# Patient Record
Sex: Female | Born: 1980
Health system: Southern US, Community
[De-identification: ages and names within clinical notes are randomized; demographics above are authoritative.]

## PROBLEM LIST (undated history)

## (undated) ENCOUNTER — Inpatient Hospital Stay (HOSPITAL_COMMUNITY): Payer: Self-pay

## (undated) DIAGNOSIS — F32A Depression, unspecified: Secondary | ICD-10-CM

## (undated) DIAGNOSIS — C439 Malignant melanoma of skin, unspecified: Secondary | ICD-10-CM

## (undated) DIAGNOSIS — R51 Headache: Secondary | ICD-10-CM

## (undated) DIAGNOSIS — R519 Headache, unspecified: Secondary | ICD-10-CM

## (undated) DIAGNOSIS — F419 Anxiety disorder, unspecified: Secondary | ICD-10-CM

## (undated) DIAGNOSIS — R011 Cardiac murmur, unspecified: Secondary | ICD-10-CM

## (undated) DIAGNOSIS — G35 Multiple sclerosis: Principal | ICD-10-CM

## (undated) HISTORY — DX: Cardiac murmur, unspecified: R01.1

## (undated) HISTORY — PX: MELANOMA EXCISION: SHX5266

## (undated) HISTORY — DX: Headache: R51

## (undated) HISTORY — DX: Headache, unspecified: R51.9

## (undated) HISTORY — DX: Malignant melanoma of skin, unspecified: C43.9

---
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
## 2012-09-09 ENCOUNTER — Inpatient Hospital Stay (HOSPITAL_COMMUNITY)
Admission: EM | Admit: 2012-09-09 | Discharge: 2012-09-11 | DRG: 060 | Disposition: A | Discharge status: Home / Self Care | Payer: Medicaid Other | Attending: Family Medicine | Admitting: Family Medicine

## 2012-09-09 ENCOUNTER — Inpatient Hospital Stay (HOSPITAL_COMMUNITY): Payer: Medicaid Other

## 2012-09-09 ENCOUNTER — Encounter (HOSPITAL_COMMUNITY): Payer: Self-pay | Admitting: *Deleted

## 2012-09-09 DIAGNOSIS — D509 Iron deficiency anemia, unspecified: Secondary | ICD-10-CM | POA: Diagnosis present

## 2012-09-09 DIAGNOSIS — R32 Unspecified urinary incontinence: Secondary | ICD-10-CM | POA: Diagnosis present

## 2012-09-09 DIAGNOSIS — G35 Multiple sclerosis: Principal | ICD-10-CM | POA: Diagnosis present

## 2012-09-09 HISTORY — DX: Multiple sclerosis: G35

## 2012-09-09 LAB — COMPREHENSIVE METABOLIC PANEL
AST: 13 U/L (ref 0–37)
Albumin: 3.8 g/dL (ref 3.5–5.2)
Calcium: 9.2 mg/dL (ref 8.4–10.5)
Creatinine, Ser: 0.64 mg/dL (ref 0.50–1.10)

## 2012-09-09 LAB — URINALYSIS, ROUTINE W REFLEX MICROSCOPIC
Glucose, UA: NEGATIVE mg/dL
Specific Gravity, Urine: 1.034 — ABNORMAL HIGH (ref 1.005–1.030)

## 2012-09-09 LAB — URINE MICROSCOPIC-ADD ON

## 2012-09-09 LAB — CBC WITH DIFFERENTIAL/PLATELET
Basophils Absolute: 0 10*3/uL (ref 0.0–0.1)
Basophils Relative: 0 % (ref 0–1)
Eosinophils Relative: 2 % (ref 0–5)
HCT: 34.1 % — ABNORMAL LOW (ref 36.0–46.0)
MCHC: 33.7 g/dL (ref 30.0–36.0)
MCV: 91.4 fL (ref 78.0–100.0)
Monocytes Absolute: 0.5 10*3/uL (ref 0.1–1.0)
Neutro Abs: 4 10*3/uL (ref 1.7–7.7)
RDW: 12.6 % (ref 11.5–15.5)

## 2012-09-09 LAB — PREGNANCY, URINE: Preg Test, Ur: NEGATIVE

## 2012-09-09 MED ORDER — ONDANSETRON HCL 4 MG PO TABS: 4.0000 mg | ORAL_TABLET | Freq: Four times a day (QID) | ORAL | Status: DC | PRN

## 2012-09-09 MED ORDER — ZOLPIDEM TARTRATE 5 MG PO TABS: 5.0000 mg | ORAL_TABLET | Freq: Every evening | ORAL | Status: DC | PRN

## 2012-09-09 MED ORDER — ONDANSETRON HCL 4 MG/2ML IJ SOLN: 4.0000 mg | Freq: Three times a day (TID) | INTRAMUSCULAR | Status: DC | PRN

## 2012-09-09 MED ORDER — ACETAMINOPHEN 325 MG PO TABS
650.0000 mg | ORAL_TABLET | Freq: Four times a day (QID) | ORAL | Status: DC | PRN
  Administered 2012-09-09: 650 mg via ORAL
  Filled 2012-09-09 (×2): qty 2

## 2012-09-09 MED ORDER — ONDANSETRON HCL 4 MG/2ML IJ SOLN: 4.0000 mg | Freq: Four times a day (QID) | INTRAMUSCULAR | Status: DC | PRN

## 2012-09-09 MED ORDER — ALUM & MAG HYDROXIDE-SIMETH 200-200-20 MG/5ML PO SUSP: 30.0000 mL | Freq: Four times a day (QID) | ORAL | Status: DC | PRN

## 2012-09-09 MED ORDER — SODIUM CHLORIDE 0.9 % IV SOLN
INTRAVENOUS | Status: AC
  Administered 2012-09-09: 20:00:00 via INTRAVENOUS

## 2012-09-09 MED ORDER — SENNOSIDES-DOCUSATE SODIUM 8.6-50 MG PO TABS
1.0000 | ORAL_TABLET | Freq: Every evening | ORAL | Status: DC | PRN
  Administered 2012-09-10: 1 via ORAL
  Filled 2012-09-09: qty 1

## 2012-09-09 MED ORDER — POLYSACCHARIDE IRON COMPLEX 150 MG PO CAPS
150.0000 mg | ORAL_CAPSULE | Freq: Every day | ORAL | Status: DC
  Administered 2012-09-09 – 2012-09-11 (×3): 150 mg via ORAL
  Filled 2012-09-09 (×3): qty 1

## 2012-09-09 MED ORDER — ENOXAPARIN SODIUM 40 MG/0.4ML ~~LOC~~ SOLN
40.0000 mg | SUBCUTANEOUS | Status: DC
  Administered 2012-09-09 – 2012-09-10 (×2): 40 mg via SUBCUTANEOUS
  Filled 2012-09-09 (×3): qty 0.4

## 2012-09-09 MED ORDER — SODIUM CHLORIDE 0.9 % IV SOLN
1000.0000 mg | Freq: Every day | INTRAVENOUS | Status: DC
  Administered 2012-09-09 – 2012-09-11 (×3): 1000 mg via INTRAVENOUS
  Filled 2012-09-09 (×3): qty 8

## 2012-09-09 MED ORDER — GADOBENATE DIMEGLUMINE 529 MG/ML IV SOLN
15.0000 mL | Freq: Once | INTRAVENOUS | Status: AC | PRN
  Administered 2012-09-09: 15 mL via INTRAVENOUS

## 2012-09-09 MED ORDER — ACETAMINOPHEN 650 MG RE SUPP: 650.0000 mg | Freq: Four times a day (QID) | RECTAL | Status: DC | PRN

## 2012-09-09 NOTE — ED Notes (Signed)
Ordered tray to be delivered on 4N-11

## 2012-09-09 NOTE — Progress Notes (Signed)
[facility] History and Physical  Terence Googe ZOX:[id number] DOB: 1980-11-01 DOA: 09/09/2012  Referring physician: ED PCP: This patient does not have a primary care physician Specialists: Neurology  Chief Complaint:  Chief Complaint  Patient presents with  . Weakness     HPI: Sarah [name] is a 32 y.o. female with history of multiple sclerosis, who was followed at [facility] until 2011, presented to the emergency room today with progressive urinary incontinence, alternating with urinary retention, bilateral lower extremity numbness and weakness. She denies double vision, difficulty swallowing, difficulty breathing. Her prior episodes were manifested by up the neuritis in 2002 and right leg weakness in 2011. She used to be on Betaseron but currently is on no medications for multiple sclerosis. The patient was seen by the neurologist in the emergency room, started on pulse dose IV steroids and referred for admission.   Review of Systems:  The patient reports occasional headaches, significant difficulty walking The patient denies anorexia, fever, weight loss,, vision loss, decreased hearing, hoarseness, chest pain, syncope, dyspnea on exertion, peripheral edema, balance deficits, hemoptysis, abdominal pain, melena, hematochezia, severe indigestion/heartburn, hematuria, genital sores, muscle weakness, suspicious skin lesions, transient blindness, depression, unusual weight change, abnormal bleeding, enlarged lymph nodes, angioedema, and breast masses.    Past Medical History  Diagnosis Date  . Multiple sclerosis    Past Surgical History  Procedure Date  . Cesarean section   . Melanoma excision age 66    on head   Social History:  reports that she has never smoked. She does not have any smokeless tobacco history on file. She reports that she drinks alcohol. She reports that she does not use illicit drugs. Patient is independent at home No Known Allergies  Family History  Problem  Relation Age of Onset  . Diabetes Mother      Prior to Admission medications   Medication Sig Start Date End Date Taking? Authorizing Provider  magnesium gluconate (MAGONATE) 500 MG tablet Take 250 mg by mouth daily.   Yes Historical Provider, MD   Physical Exam: Filed Vitals:   09/09/12 1615 09/09/12 1630 09/09/12 1645 09/09/12 1700  BP: 119/80 110/74 113/68 115/58  Pulse: 73 75 64 60  Temp:      TempSrc:      Resp: 14 17    SpO2: 99% 100% 100% 99%     General:  Alert and oriented x3  Eyes: Pupil equal round react to light accommodation, extra ocular movement intact  ENT: Clear pharynx, moist oral mucosa, normal-appearing external ears  Neck: No jugular venous distention  Cardiovascular: Regular rate and rhythm without murmurs rubs or gallops  Respiratory: Clear to auscultation bilaterally without wheezes rhonchi crackles  Abdomen: Soft nontender nondistended bowel sounds are present  Skin: Warm dry without rashes  Musculoskeletal: Intact muscle bulk and tone  Psychiatric: Euthymic  Neurologic: Cranial nerves 2-12 intact, strength 3/5 both lower extremities bilaterally  Labs on Admission:  Basic Metabolic Panel:  Lab 09/09/12 8119  NA 139  K 3.5  CL 102  CO2 27  GLUCOSE 95  BUN 10  CREATININE 0.64  CALCIUM 9.2  MG --  PHOS --   Liver Function Tests:  Lab 09/09/12 1530  AST 13  ALT 11  ALKPHOS 77  BILITOT 0.2*  PROT 7.7  ALBUMIN 3.8   No results found for this basename: LIPASE:5,AMYLASE:5 in the last 168 hours No results found for this basename: AMMONIA:5 in the last 168 hours CBC:  Lab 09/09/12 1530  WBC 6.8  NEUTROABS 4.0  HGB 11.5*  HCT 34.1*  MCV 91.4  PLT 264   Cardiac Enzymes: No results found for this basename: CKTOTAL:5,CKMB:5,CKMBINDEX:5,TROPONINI:5 in the last 168 hours  BNP (last 3 results) No results found for this basename: PROBNP:3 in the last 8760 hours CBG: No results found for this basename: GLUCAP:5 in the last 168  hours  Radiological Exams on Admission: No results found.   Assessment/Plan Principal Problem:  *Multiple sclerosis exacerbation Active Problems:  Anemia, iron deficiency  Urinary incontinence   1. Multiple sclerosis exacerbation-suspect affecting the cervical spinal cord. Patient will undergo an MRI of the head and C-spine with IV contrast. We'll admit her, obtain frequent neurological checks, start pulse dose steroids. Will get also physical therapy evaluation 2. Anemia-probably iron deficiency from heavy menses. Start oral iron  Sarah [name] [facility] Pager [phone]  If 7PM-7AM, please contact night-coverage [email or web address] Password [facility] 09/09/2012, 5:32 PM

## 2012-09-09 NOTE — ED Notes (Signed)
Pt to MRI

## 2012-09-09 NOTE — Consult Note (Signed)
NEURO HOSPITALIST CONSULT NOTE    Reason for Consult:MS relapse.  HPI:                                                                                                                                          Sarah [name] is an 32 y.o. female, right handed, with a past medical history significant for multiple sclerosis formally diagnosed in 2002 after an episode of optic neuritis, who was in her usual stated of heath until 3 weeks ago when started having progressive bilateral lower extremity weakness, numbness around the genitalia area, bladder and bowel incontinence. She said that the weakness is greater in the right than the left leg and is so severe that she had had a couple of falls " because I can not stand and walk long distances". Of importance, she said that her last relapse occurred in 2011 and was characterized by right leg weakness. She indicated that she was initially treated with Betaseron but had to stop it because of side effects. She hasn't seen her neurologist at [facility] since probably 2010-2011, and at that time she had her last MRI. Denies vertigo, double vision, slurred speech, difficulty swallowing, or tremors. Frequent headache.   Past Medical History  Diagnosis Date  . Multiple sclerosis     Past Surgical History  Procedure Date  . Cesarean section     No family history on file.  Family History: no MS.  Social History:  reports that she has never smoked. She does not have any smokeless tobacco history on file. She reports that she drinks alcohol. She reports that she does not use illicit drugs.  No Known Allergies  MEDICATIONS:                                                                                                                     I have reviewed the patient's current medications.   ROS:  History obtained from the patient  General ROS: negative for - chills, fatigue, fever, night sweats, weight gain or weight loss Psychological ROS: negative for - behavioral disorder, hallucinations, memory difficulties, mood swings or suicidal ideation Ophthalmic ROS: negative for - blurry vision, double vision, eye pain or loss of vision ENT ROS: negative for - epistaxis, nasal discharge, oral lesions, sore throat, tinnitus or vertigo Allergy and Immunology ROS: negative for - hives or itchy/watery eyes Hematological and Lymphatic ROS: negative for - bleeding problems, bruising or swollen lymph nodes Endocrine ROS: negative for - galactorrhea, hair pattern changes, polydipsia/polyuria or temperature intolerance Respiratory ROS: negative for - cough, hemoptysis, shortness of breath or wheezing Cardiovascular ROS: negative for - chest pain, dyspnea on exertion, edema or irregular heartbeat Gastrointestinal ROS: negative for - abdominal pain, diarrhea, hematemesis, nausea/vomiting or stool incontinence Genito-Urinary ROS: negative for - dysuria, hematuria but positive for frequency/urgency Musculoskeletal ROS: negative for - joint swelling. Neurological ROS: as noted in HPI Dermatological ROS: negative for rash and skin lesion changes  Physical exam: pleasant female in no apparent distress. Blood pressure 147/83, pulse 102, temperature 98.1 F (36.7 C), temperature source Oral, resp. rate 18, SpO2 100.00%. Head: normocephalic. Neck: supple. Cardiac: no murmurs. Lungs: clear. Abdomen: soft. Extremities: no edema.  Neurologic Examination:                                                                                                      Mental Status: Alert, oriented, thought content appropriate.  Speech fluent without evidence of aphasia.  Able to follow 3 step commands without difficulty. Cranial Nerves: II: Discs flat bilaterally; Visual fields grossly normal, pupils equal, round,  reactive to light and accommodation III,IV, VI: ptosis not present, extra-ocular motions intact bilaterally V,VII: smile symmetric, facial light touch sensation normal bilaterally VIII: hearing normal bilaterally IX,X: gag reflex present XI: bilateral shoulder shrug XII: midline tongue extension Motor:  Significant for proximal and distal bilateral lower extremity weakness, right greater than left. Tone and bulk:normal tone throughout; no atrophy noted Sensory: decreased pinprick and light touch from approximately T7-8 all the way downward to her lower legs. Deep Tendon Reflexes: 2+ upper extremities. Hyperreflexia without clonus bilateral lower extremities.  Plantars: upgoing. Cerebellar: normal finger-to-nose,  normal heel-to-shin test Gait: no ataxia but difficulty walking due to severe bilateral LE weakness. CV: pulses palpable throughout     No results found for this basename: cbc, bmp, coags, chol, tri, ldl, hga1c    Results for orders placed during the hospital encounter of 09/09/12 (from the past 48 hour(s))  CBC WITH DIFFERENTIAL     Status: Abnormal   Collection Time   09/09/12  3:30 PM      Component Value Range Comment   WBC 6.8  4.0 - 10.5 K/uL    RBC 3.73 (*) 3.87 - 5.11 MIL/uL    Hemoglobin 11.5 (*) 12.0 - 15.0 g/dL    HCT 54.0 (*) 98.1 - 46.0 %    MCV 91.4  78.0 - 100.0 fL    MCH 30.8  26.0 - 34.0 pg  MCHC 33.7  30.0 - 36.0 g/dL    RDW 56.2  13.0 - 86.5 %    Platelets 264  150 - 400 K/uL    Neutrophils Relative 59  43 - 77 %    Neutro Abs 4.0  1.7 - 7.7 K/uL    Lymphocytes Relative 32  12 - 46 %    Lymphs Abs 2.2  0.7 - 4.0 K/uL    Monocytes Relative 8  3 - 12 %    Monocytes Absolute 0.5  0.1 - 1.0 K/uL    Eosinophils Relative 2  0 - 5 %    Eosinophils Absolute 0.1  0.0 - 0.7 K/uL    Basophils Relative 0  0 - 1 %    Basophils Absolute 0.0  0.0 - 0.1 K/uL     No results found.   Assessment/Plan: 32 years old female with long standing history of  RR-MS, no taking disease modifying therapy since 2010-11, presents with a spinal cord syndrome in the context of a MS relapse. Recommendations: IV solumedrol 1 gram daily for 3 days. MRI brain and cervico-thoracic spine with and without contrast. Physical therapy consult. Will follow up.  Wyatt Portela, MD [facility] [phone]  09/09/2012, 4:07 PM

## 2012-09-09 NOTE — ED Notes (Signed)
Patient has hx of MS,  She is having decreased sensation from her waist down.  She states her legs feel weak/heavy.  Patient states her legs are buckling on her.  She is also having decreased ability to control her bowel/bladder.  Patient states her sx have worsened over the past 3 weeks.  Patient is not taking any meds at present.  Patient was seen at [facility] and hospitalized 02-11.  Patient states she noted onset of weakness 6 mths ago but thought it was just related to stress.  Patient has had a couple of falls.  Her left knee was swollen.  Her right knee is the one that is weaker and gives out

## 2012-09-09 NOTE — ED Provider Notes (Signed)
History     CSN: [id number]  Arrival date & time 09/09/12  1411   First MD Initiated Contact with Patient 09/09/12 1430      Chief Complaint  Patient presents with  . Weakness    (Consider location/radiation/quality/duration/timing/severity/associated sxs/prior treatment) HPI Comments: Patient reports history of multiple sclerosis and believes she is having exacerbation. States over the past 3 weeks she's had decreased sensation from her waist down, weakness in her legs, decreased ability to control her bowel and bladder with multiple episodes of incontinence. She's gotten too weak to stand. She's has not been on medications for her MS. She has not seen her neurologist at [facility] since 2010. She denies any fevers or vomiting. She denies any chest pain or shortness of breath. No difficulty breathing or swallowing.  The history is provided by the patient.    Past Medical History  Diagnosis Date  . Multiple sclerosis     Past Surgical History  Procedure Date  . Cesarean section     No family history on file.  History  Substance Use Topics  . Smoking status: Never Smoker   . Smokeless tobacco: Not on file  . Alcohol Use: Yes    OB History    Grav Para Term Preterm Abortions TAB SAB Ect Mult Living                  Review of Systems  Constitutional: Negative for fever, activity change and appetite change.  HENT: Negative for congestion and rhinorrhea.   Respiratory: Negative for cough, chest tightness and shortness of breath.   Cardiovascular: Negative for chest pain.  Gastrointestinal: Negative for nausea, vomiting and abdominal pain.  Genitourinary: Negative for dysuria.  Musculoskeletal: Positive for myalgias and arthralgias. Negative for back pain.  Skin: Negative for rash.  Neurological: Positive for weakness and numbness. Negative for light-headedness and headaches.  A complete 10 system review of systems was obtained and all systems are negative except as noted in  the HPI and PMH.    Allergies  Review of patient's allergies indicates no known allergies.  Home Medications   Current Outpatient Rx  Name  Route  Sig  Dispense  Refill  . MAGNESIUM GLUCONATE 500 MG PO TABS   Oral   Take 250 mg by mouth daily.           BP 147/83  Pulse 102  Temp 98.1 F (36.7 C) (Oral)  Resp 18  SpO2 100%  Physical Exam  Constitutional: She is oriented to person, place, and time. She appears well-developed and well-nourished. No distress.  HENT:  Head: Normocephalic and atraumatic.  Mouth/Throat: Oropharynx is clear and moist. No oropharyngeal exudate.  Eyes: Conjunctivae normal and EOM are normal. Pupils are equal, round, and reactive to light.  Neck: Normal range of motion. Neck supple.  Cardiovascular: Normal rate, regular rhythm and normal heart sounds.   No murmur heard. Pulmonary/Chest: Effort normal and breath sounds normal. No respiratory distress.  Abdominal: Soft. There is no tenderness. There is no rebound and no guarding.  Genitourinary:       No saddle anesthesia, normal rectal tone. Chaperone present.  Musculoskeletal: Normal range of motion. She exhibits no edema and no tenderness.  Neurological: She is alert and oriented to person, place, and time. No cranial nerve deficit. Coordination normal.       4/5 strength in the bilateral lower extremities. Weak great toe extension bilaterally, weak ankle flexion and extension. +2 DP and PT pulses. Sensation is  subjectively decreased  Skin: Skin is warm.    ED Course  Procedures (including critical care time)  Labs Reviewed  CBC WITH DIFFERENTIAL - Abnormal; Notable for the following:    RBC 3.73 (*)     Hemoglobin 11.5 (*)     HCT 34.1 (*)     All other components within normal limits  COMPREHENSIVE METABOLIC PANEL - Abnormal; Notable for the following:    Total Bilirubin 0.2 (*)     All other components within normal limits  URINALYSIS, ROUTINE W REFLEX MICROSCOPIC - Abnormal; Notable  for the following:    APPearance CLOUDY (*)     Specific Gravity, Urine 1.034 (*)     Hgb urine dipstick SMALL (*)     Leukocytes, UA SMALL (*)     All other components within normal limits  URINE MICROSCOPIC-ADD ON - Abnormal; Notable for the following:    Squamous Epithelial / LPF FEW (*)     All other components within normal limits  PREGNANCY, URINE   No results found.   1. Multiple sclerosis exacerbation       MDM  History of untreated multiple sclerosis presenting with decreased sensation from waist down with weakness in legs and bladder and bowel incontinence.  Lower showing any weakness with incontinence concerning for MS of spinal cord.  Discussed with Dr. Leroy Kennedy of neurology who has seen the patient. Recommends MR of brain and spine. High dose steroids begun. Will admit to hospitalist service.    Glynn Octave, MD 09/09/12 1754

## 2012-09-10 LAB — CBC
HCT: 37.1 % (ref 36.0–46.0)
MCHC: 33.4 g/dL (ref 30.0–36.0)
RDW: 12.4 % (ref 11.5–15.5)

## 2012-09-10 LAB — BASIC METABOLIC PANEL
BUN: 9 mg/dL (ref 6–23)
Calcium: 9.6 mg/dL (ref 8.4–10.5)
Creatinine, Ser: 0.6 mg/dL (ref 0.50–1.10)
GFR calc Af Amer: >90 mL/min (ref >90)
GFR calc non Af Amer: >90 mL/min (ref >90)

## 2012-09-10 MED ORDER — BACLOFEN 5 MG HALF TABLET
5.0000 mg | ORAL_TABLET | Freq: Two times a day (BID) | ORAL | Status: DC
  Administered 2012-09-10 – 2012-09-11 (×3): 5 mg via ORAL
  Filled 2012-09-10 (×4): qty 1

## 2012-09-10 NOTE — Progress Notes (Addendum)
NEURO HOSPITALIST PROGRESS NOTE   SUBJECTIVE:                                                                                                                        No new neurological complains today but said that the " old cramps in my legs are coming back". Receiving IV solumedrol, day 2/3. Started PT. MRI brain and cervico-thoracic spine reviewed:diffuse cord abnormality extending involving cervical and thoracic cord with associated areas of enhancement at T5- 6. Likewise, brain MRI demonstrated periventricular demyelinating lesions without areas of enhancement.    OBJECTIVE:                                                                                                                           Vital signs in last 24 hours: Temp:  [97.7 F (36.5 C)-98.1 F (36.7 C)] 98.1 F (36.7 C) (02/02 1021) Pulse Rate:  [60-102] 65  (02/02 1021) Resp:  [14-19] 18  (02/02 1021) BP: (102-147)/(58-83) 112/58 mmHg (02/02 1021) SpO2:  [99 %-100 %] 100 % (02/02 1021) Weight:  [135.036 kg (297 lb 11.2 oz)] 135.036 kg (297 lb 11.2 oz) (02/01 2053)  Intake/Output from previous day: 02/01 0701 - 02/02 0700 In: 200 [P.O.:200] Out: -  Intake/Output this shift:   Nutritional status: General  Past Medical History  Diagnosis Date  . Multiple sclerosis     Neurologic ROS negative with exception of above. Musculoskeletal ROS: bilateral legs weakness and paraesthesias.  Neurologic Exam:  Mental Status:  Alert, oriented, thought content appropriate. Speech fluent without evidence of aphasia. Able to follow 3 step commands without difficulty.  Cranial Nerves:  II: Discs flat bilaterally; Visual fields grossly normal, pupils equal, round, reactive to light and accommodation  III,IV, VI: ptosis not present, extra-ocular motions intact bilaterally  V,VII: smile symmetric, facial light touch sensation normal bilaterally  VIII: hearing normal bilaterally  IX,X: gag  reflex present  XI: bilateral shoulder shrug  XII: midline tongue extension  Motor:  Significant for proximal and distal bilateral lower extremity weakness, right greater than left.  Tone and bulk:normal tone throughout; no atrophy noted  Sensory: decreased pinprick and light touch from approximately T7-8 all the way downward to her lower legs.  Deep Tendon  Reflexes: 2+ upper extremities. Hyperreflexia without clonus bilateral lower extremities.  Plantars: upgoing.  Cerebellar:  normal finger-to-nose. Did not test heel to shin. Gait: no ataxia but difficulty walking due to severe bilateral LE weakness.  CV: pulses palpable throughout    Lab Results: No results found for this basename: cbc, bmp, coags, chol, tri, ldl, hga1c   Lipid Panel No results found for this basename: CHOL,TRIG,HDL,CHOLHDL,VLDL,LDLCALC in the last 72 hours  Studies/Results: Mr Laqueta Jean Wo Contrast  09/09/2012  *RADIOLOGY REPORT*  Clinical Data:  Multiple sclerosis.  Progressive bilateral lower extremity weakness and numbness, right greater than left.  Bladder and bowel incontinence.  MRI HEAD WITHOUT AND WITH CONTRAST MRI CERVICAL SPINE WITHOUT AND WITH CONTRAST  Technique:  Multiplanar, multiecho pulse sequences of the brain and surrounding structures, and cervical spine, to include the craniocervical junction and cervicothoracic junction, were obtained without and with intravenous contrast.  Contrast: 15mL MULTIHANCE GADOBENATE DIMEGLUMINE 529 MG/ML IV SOLN  Comparison:   None.  MRI HEAD  Findings:  Scattered periventricular and subcortical white matter lesions are present bilaterally.  Focal lesion within the medial left temporal lobe measures 8 mm.  There is no restricted diffusion or enhancement associated with these lesions.  Postcontrast images demonstrate no pathologic enhancement of the brain parenchyma.  Flow is present in the major intracranial arteries.  The globes and orbits are intact.  The paranasal sinuses  and mastoid air cells are clear.  The postcontrast images demonstrate no pathologic enhancement.  IMPRESSION:  1.  Bilateral periventricular and subcortical white matter lesions are compatible with the given diagnosis of multiple sclerosis. 2.  No restricted diffusion or enhancement to suggest acute demyelination.  This does not exclude the possibility of acute demyelination.  MRI CERVICAL SPINE  Findings: Extensive white matter changes are present in the spinal cord on the sagittal STIR sequences as well as the axial gradient sequences.  There is a lesion at C2-3 measuring 14 mm and a lesion at C5 and 15 mm.  Extensive cord signal abnormality begins at T1-2 and descends.  There is no associated enhancement.  No significant disc herniation or stenosis is present.  Flow is present in the major vascular structures of the neck.  The soft tissues of the neck are unremarkable.  IMPRESSION:  1.  Areas of increased T2 hyperintensity in the spinal cord without expansion at C2-3 and C5 are compatible with demyelinating lesions. 2.  A left-sided lesion is present at C7 measuring 15 mm. 3.  No significant disc disease or stenosis.  MRI THORACIC SPINE WITHOUT AND WITH CONTRAST  Technique:  Multiplanar and multiecho pulse sequences of the thoracic spine were obtained without and with intravenous contrast.  Contrast: 15mL MULTIHANCE GADOBENATE DIMEGLUMINE 529 MG/ML IV SOLN  Findings:  Diffuse cord signal abnormality is present from T1 through T8-9.  There is no expansion of the cord.  There is focal enhancement of the ventral cord at T5-6 extending cephalocaudad for 12 mm.  No other pathologic enhancement of the cord is evident. The lower thoracic spinal cord is within normal limits.  The conus medullaris terminates at T12-L1, within normal limits.  Marrow signal, vertebral body heights, and alignment are normal.  No significant disc herniation or stenosis.  Small pleural effusions are present bilaterally.  The paraspinous soft  tissues are otherwise within normal limits.  IMPRESSION:  1.  Diffuse cord signal abnormality T1-T9 is compatible with the given diagnosis of multiple sclerosis. 2.  Focal enhancement along the ventral surface  of the cord at T5-6 extends cephalocaudad for 12 mm.  This may represent an area of acute demyelination.  No other pathologic enhancement is present. 3.  Small bilateral pleural effusions.   Original Report Authenticated By: Marin Roberts, M.D.    Mr Cervical Spine W Wo Contrast  09/09/2012  *RADIOLOGY REPORT*  Clinical Data:  Multiple sclerosis.  Progressive bilateral lower extremity weakness and numbness, right greater than left.  Bladder and bowel incontinence.  MRI HEAD WITHOUT AND WITH CONTRAST MRI CERVICAL SPINE WITHOUT AND WITH CONTRAST  Technique:  Multiplanar, multiecho pulse sequences of the brain and surrounding structures, and cervical spine, to include the craniocervical junction and cervicothoracic junction, were obtained without and with intravenous contrast.  Contrast: 15mL MULTIHANCE GADOBENATE DIMEGLUMINE 529 MG/ML IV SOLN  Comparison:   None.  MRI HEAD  Findings:  Scattered periventricular and subcortical white matter lesions are present bilaterally.  Focal lesion within the medial left temporal lobe measures 8 mm.  There is no restricted diffusion or enhancement associated with these lesions.  Postcontrast images demonstrate no pathologic enhancement of the brain parenchyma.  Flow is present in the major intracranial arteries.  The globes and orbits are intact.  The paranasal sinuses and mastoid air cells are clear.  The postcontrast images demonstrate no pathologic enhancement.  IMPRESSION:  1.  Bilateral periventricular and subcortical white matter lesions are compatible with the given diagnosis of multiple sclerosis. 2.  No restricted diffusion or enhancement to suggest acute demyelination.  This does not exclude the possibility of acute demyelination.  MRI CERVICAL SPINE   Findings: Extensive white matter changes are present in the spinal cord on the sagittal STIR sequences as well as the axial gradient sequences.  There is a lesion at C2-3 measuring 14 mm and a lesion at C5 and 15 mm.  Extensive cord signal abnormality begins at T1-2 and descends.  There is no associated enhancement.  No significant disc herniation or stenosis is present.  Flow is present in the major vascular structures of the neck.  The soft tissues of the neck are unremarkable.  IMPRESSION:  1.  Areas of increased T2 hyperintensity in the spinal cord without expansion at C2-3 and C5 are compatible with demyelinating lesions. 2.  A left-sided lesion is present at C7 measuring 15 mm. 3.  No significant disc disease or stenosis.  MRI THORACIC SPINE WITHOUT AND WITH CONTRAST  Technique:  Multiplanar and multiecho pulse sequences of the thoracic spine were obtained without and with intravenous contrast.  Contrast: 15mL MULTIHANCE GADOBENATE DIMEGLUMINE 529 MG/ML IV SOLN  Findings:  Diffuse cord signal abnormality is present from T1 through T8-9.  There is no expansion of the cord.  There is focal enhancement of the ventral cord at T5-6 extending cephalocaudad for 12 mm.  No other pathologic enhancement of the cord is evident. The lower thoracic spinal cord is within normal limits.  The conus medullaris terminates at T12-L1, within normal limits.  Marrow signal, vertebral body heights, and alignment are normal.  No significant disc herniation or stenosis.  Small pleural effusions are present bilaterally.  The paraspinous soft tissues are otherwise within normal limits.  IMPRESSION:  1.  Diffuse cord signal abnormality T1-T9 is compatible with the given diagnosis of multiple sclerosis. 2.  Focal enhancement along the ventral surface of the cord at T5-6 extends cephalocaudad for 12 mm.  This may represent an area of acute demyelination.  No other pathologic enhancement is present. 3.  Small bilateral pleural effusions.  Original Report Authenticated By: Marin Roberts, M.D.    Mr Thoracic Spine W Wo Contrast  09/09/2012  *RADIOLOGY REPORT*  Clinical Data:  Multiple sclerosis.  Progressive bilateral lower extremity weakness and numbness, right greater than left.  Bladder and bowel incontinence.  MRI HEAD WITHOUT AND WITH CONTRAST MRI CERVICAL SPINE WITHOUT AND WITH CONTRAST  Technique:  Multiplanar, multiecho pulse sequences of the brain and surrounding structures, and cervical spine, to include the craniocervical junction and cervicothoracic junction, were obtained without and with intravenous contrast.  Contrast: 15mL MULTIHANCE GADOBENATE DIMEGLUMINE 529 MG/ML IV SOLN  Comparison:   None.  MRI HEAD  Findings:  Scattered periventricular and subcortical white matter lesions are present bilaterally.  Focal lesion within the medial left temporal lobe measures 8 mm.  There is no restricted diffusion or enhancement associated with these lesions.  Postcontrast images demonstrate no pathologic enhancement of the brain parenchyma.  Flow is present in the major intracranial arteries.  The globes and orbits are intact.  The paranasal sinuses and mastoid air cells are clear.  The postcontrast images demonstrate no pathologic enhancement.  IMPRESSION:  1.  Bilateral periventricular and subcortical white matter lesions are compatible with the given diagnosis of multiple sclerosis. 2.  No restricted diffusion or enhancement to suggest acute demyelination.  This does not exclude the possibility of acute demyelination.  MRI CERVICAL SPINE  Findings: Extensive white matter changes are present in the spinal cord on the sagittal STIR sequences as well as the axial gradient sequences.  There is a lesion at C2-3 measuring 14 mm and a lesion at C5 and 15 mm.  Extensive cord signal abnormality begins at T1-2 and descends.  There is no associated enhancement.  No significant disc herniation or stenosis is present.  Flow is present in the major  vascular structures of the neck.  The soft tissues of the neck are unremarkable.  IMPRESSION:  1.  Areas of increased T2 hyperintensity in the spinal cord without expansion at C2-3 and C5 are compatible with demyelinating lesions. 2.  A left-sided lesion is present at C7 measuring 15 mm. 3.  No significant disc disease or stenosis.  MRI THORACIC SPINE WITHOUT AND WITH CONTRAST  Technique:  Multiplanar and multiecho pulse sequences of the thoracic spine were obtained without and with intravenous contrast.  Contrast: 15mL MULTIHANCE GADOBENATE DIMEGLUMINE 529 MG/ML IV SOLN  Findings:  Diffuse cord signal abnormality is present from T1 through T8-9.  There is no expansion of the cord.  There is focal enhancement of the ventral cord at T5-6 extending cephalocaudad for 12 mm.  No other pathologic enhancement of the cord is evident. The lower thoracic spinal cord is within normal limits.  The conus medullaris terminates at T12-L1, within normal limits.  Marrow signal, vertebral body heights, and alignment are normal.  No significant disc herniation or stenosis.  Small pleural effusions are present bilaterally.  The paraspinous soft tissues are otherwise within normal limits.  IMPRESSION:  1.  Diffuse cord signal abnormality T1-T9 is compatible with the given diagnosis of multiple sclerosis. 2.  Focal enhancement along the ventral surface of the cord at T5-6 extends cephalocaudad for 12 mm.  This may represent an area of acute demyelination.  No other pathologic enhancement is present. 3.  Small bilateral pleural effusions.   Original Report Authenticated By: Marin Roberts, M.D.     MEDICATIONS  I have reviewed the patient's current medications.  ASSESSMENT/PLAN:                                                                                                           RR-MS, now with a relapse  that seem to be emanating from her thoracic spinal cord. MRI results discussed in details with patient, and I stressed to her the fact that she must resume disease modifying therapy. She could be a  good candidate for tysabri if she is JC virus negative, but of course this decision will be make in the outpatient setting.  Plan discussed with [place].  Patient is to finish IV steroids then oral taper prednisone, followed with out patient PT.   S/O  Wyatt Portela, MD [facility] [phone]  09/10/2012, 10:23 AM

## 2012-09-10 NOTE — Evaluation (Signed)
Physical Therapy Evaluation Patient Details Name: Sarah [name] MRN: [id number] DOB: 1981/01/06 Today's Date: 09/10/2012 Time: 2952-8413 PT Time Calculation (min): 28 min  PT Assessment / Plan / Recommendation Clinical Impression  Pt is a pleasent 32 y.o. female with possible MS exacerbation presenting with deficits in functional mobility secondary to weakness, decreased sensation, increased tone, and decreased activity tolerance. Pt has good family support and husband is available 24/7 upon d/c.  Instructed pt in energy conservation techniques and recommend continued use of rw at this time for stability.  Will continue to see patient acutely to address deficits as indicated.     PT Assessment  Patient needs continued PT services    Follow Up Recommendations  Supervision/Assistance - 24 hour (possible Outpatient PT pending progression/resolution )    Does the patient have the potential to tolerate intense rehabilitation      Barriers to Discharge None Pt has experienced MS exacerbations previously and demonstrates good awareness of physical limitations, anticipate pt will be safe with PRN assist from husband upon discharge.    Equipment Recommendations   (3 in 1 commode)    Recommendations for Other Services     Frequency Min 3X/week    Precautions / Restrictions Precautions Precautions: Fall   Pertinent Vitals/Pain No pain reported at this time      Mobility  Bed Mobility Bed Mobility: Supine to Sit;Sitting - Scoot to Edge of Bed Supine to Sit: 6: Modified independent (Device/Increase time);HOB elevated Sitting - Scoot to Edge of Bed: 6: Modified independent (Device/Increase time) Details for Bed Mobility Assistance: increased time required Transfers Transfers: Sit to Stand;Stand to Sit Sit to Stand: 4: Min guard;From toilet;From bed Stand to Sit: 5: Supervision;To toilet;To chair/3-in-1;Without upper extremity assist;With armrests Details for Transfer Assistance: VC's  for hand placement and proper use of rw (instructed not to abandon walker) Ambulation/Gait Ambulation/Gait Assistance: 4: Min guard Ambulation Distance (Feet): 40 Feet Assistive device: Rolling walker Ambulation/Gait Assistance Details: pt with multiple balance check but able to self correct using rw. Advised continued use of rw.  Increase RLE tone impairing gait. Gait Pattern: Step-to pattern;Decreased stance time - right;Decreased stride length;Decreased hip/knee flexion - right;Decreased dorsiflexion - right Gait velocity: Decreased General Gait Details: Pt will benefit from continued use of rw Stairs: No       Exercises General Exercises - Lower Extremity Ankle Circles/Pumps: AROM;Both;10 reps Long Arc Quad: AROM;Both;5 reps Hip Flexion/Marching: AROM;Both;5 reps   PT Diagnosis: Difficulty walking;Abnormality of gait;Generalized weakness  PT Problem List: Decreased strength;Decreased range of motion;Decreased activity tolerance;Decreased balance;Decreased mobility;Decreased coordination;Impaired sensation;Impaired tone PT Treatment Interventions: DME instruction;Gait training;Functional mobility training;Stair training;Therapeutic activities;Therapeutic exercise;Patient/family education   PT Goals Acute Rehab PT Goals PT Goal Formulation: With patient Time For Goal Achievement: 09/17/12 Potential to Achieve Goals: Good Pt will go Sit to Stand: with modified independence PT Goal: Sit to Stand - Progress: Goal set today Pt will go Stand to Sit: with modified independence PT Goal: Stand to Sit - Progress: Goal set today Pt will Ambulate: >150 feet;with modified independence PT Goal: Ambulate - Progress: Goal set today Pt will Go Up / Down Stairs: with min assist;1-2 stairs PT Goal: Up/Down Stairs - Progress: Goal set today  Visit Information  Last PT Received On: 09/10/12 Assistance Needed: +1    Subjective Data  Subjective: I feel very nervous about this  exacerbation Patient Stated Goal: to go home   Prior Functioning  Home Living Lives With: Spouse;Family Available Help at Discharge: Family;Available 24 hours/day Type  of Home: Apartment Home Access: Stairs to enter [employer] of Steps: 2 Entrance Stairs-Rails: None Home Layout: One level Bathroom Shower/Tub: Tub/shower unit;Curtain Firefighter: Standard Home Adaptive Equipment: Environmental consultant - rolling;Reacher Prior Function Level of Independence: Independent Able to Take Stairs?: Yes Driving: Yes Vocation: Full time employment Communication Communication: No difficulties Dominant Hand: Right    Cognition  Overall Cognitive Status: Appears within functional limits for tasks assessed/performed Arousal/Alertness: Awake/alert Orientation Level: Appears intact for tasks assessed;Oriented X4 / Intact Behavior During Session: [facility] for tasks performed    Extremity/Trunk Assessment Right Upper Extremity Assessment RUE ROM/Strength/Tone: [facility] for tasks assessed Left Upper Extremity Assessment LUE ROM/Strength/Tone: WFL for tasks assessed Right Lower Extremity Assessment RLE ROM/Strength/Tone: Deficits RLE ROM/Strength/Tone Deficits: Gross RLE weakness 2+ to 3/5; increased RLE tone RLE Sensation: Deficits RLE Sensation Deficits: numbness and parasthesias RLE Coordination: Deficits RLE Coordination Deficits: decreased fine motor secondary to increased tone Left Lower Extremity Assessment LLE ROM/Strength/Tone: Deficits LLE ROM/Strength/Tone Deficits: Gross LLE weakness 3+/5 LLE Sensation: Deficits LLE Sensation Deficits: numbness and parasthesias; R>L LLE Coordination: WFL - gross motor Trunk Assessment Trunk Assessment: Normal   Balance Balance Balance Assessed: Yes High Level Balance High Level Balance Activites: Side stepping;Direction changes;Backward walking;Turns High Level Balance Comments: Pt able to complete balance activites but does elicit fatigue  End of  Session PT - End of Session Equipment Utilized During Treatment: Gait belt Activity Tolerance: Patient limited by fatigue Patient left: in chair;with call bell/phone within reach Nurse Communication: Mobility status  GP     Fabio Asa 09/10/2012, 10:08 AM  Charlotte Crumb, PT DPT  [phone]

## 2012-09-10 NOTE — Progress Notes (Signed)
Subjective: Patient seen and examined, admitted for MS exacerbation. Currently on IV steroids. Patient also complaining of cramping in the legs.  Objective: Vital signs in last 24 hours: Temp:  [97.7 F (36.5 C)-98.1 F (36.7 C)] 98.1 F (36.7 C) (02/02 1021) Pulse Rate:  [60-102] 65  (02/02 1021) Resp:  [14-19] 18  (02/02 1021) BP: (102-147)/(58-83) 112/58 mmHg (02/02 1021) SpO2:  [99 %-100 %] 100 % (02/02 1021) Weight:  [135.036 kg (297 lb 11.2 oz)] 135.036 kg (297 lb 11.2 oz) (02/01 2053) Weight change:  Last BM Date: 09/07/12  Consults: Neurology Antibiotics  None Procedures: None  Intake/Output from previous day: 02/01 0701 - 02/02 0700 In: 200 [P.O.:200] Out: -      Physical Exam: Head: Normocephalic, atraumatic.  Eyes: No signs of jaundice, EOMI Nose: Mucous membranes dry.  Throat: Oropharynx nonerythematous, no exudate appreciated.  Neck: supple,No deformities, masses, or tenderness noted. Lungs: Normal respiratory effort. B/L Clear to auscultation, no crackles or wheezes.  Heart: Regular RR. S1 and S2 normal  Abdomen: BS normoactive. Soft, Nondistended, non-tender.  Extremities: No pretibial edema, no erythema Neuro: Motor strength 4 /5 in the left lower extremity, 3/5 in the right lower extremity  Lab Results: Basic Metabolic Panel:  Basename 09/10/12 0726 09/09/12 1530  NA 137 139  K 4.0 3.5  CL 103 102  CO2 24 27  GLUCOSE 146* 95  BUN 9 10  CREATININE 0.60 0.64  CALCIUM 9.6 9.2  MG -- --  PHOS -- --   Liver Function Tests:  Basename 09/09/12 1530  AST 13  ALT 11  ALKPHOS 77  BILITOT 0.2*  PROT 7.7  ALBUMIN 3.8   No results found for this basename: LIPASE:2,AMYLASE:2 in the last 72 hours No results found for this basename: AMMONIA:2 in the last 72 hours CBC:  Basename 09/10/12 0726 09/09/12 1530  WBC 7.1 6.8  NEUTROABS -- 4.0  HGB 12.4 11.5*  HCT 37.1 34.1*  MCV 90.7 91.4  PLT 307 264   CUrine Drug Screen: Drugs of Abuse  No  results found for this basename: labopia, cocainscrnur, labbenz, amphetmu, thcu, labbarb    Alcohol Level: No results found for this basename: ETH:2 in the last 72 hours Urinalysis:  Basename 09/09/12 1551  COLORURINE YELLOW  LABSPEC 1.034*  PHURINE 6.0  GLUCOSEU NEGATIVE  HGBUR SMALL*  BILIRUBINUR NEGATIVE  KETONESUR NEGATIVE  PROTEINUR NEGATIVE  UROBILINOGEN 1.0  NITRITE NEGATIVE  LEUKOCYTESUR SMALL*   Misc. Labs:  No results found for this or any previous visit (from the past 240 hour(s)).  Studies/Results: Mr Laqueta Jean UJ Contrast  09/09/2012  *RADIOLOGY REPORT*  Clinical Data:  Multiple sclerosis.  Progressive bilateral lower extremity weakness and numbness, right greater than left.  Bladder and bowel incontinence.  MRI HEAD WITHOUT AND WITH CONTRAST MRI CERVICAL SPINE WITHOUT AND WITH CONTRAST  Technique:  Multiplanar, multiecho pulse sequences of the brain and surrounding structures, and cervical spine, to include the craniocervical junction and cervicothoracic junction, were obtained without and with intravenous contrast.  Contrast: 15mL MULTIHANCE GADOBENATE DIMEGLUMINE 529 MG/ML IV SOLN  Comparison:   None.  MRI HEAD  Findings:  Scattered periventricular and subcortical white matter lesions are present bilaterally.  Focal lesion within the medial left temporal lobe measures 8 mm.  There is no restricted diffusion or enhancement associated with these lesions.  Postcontrast images demonstrate no pathologic enhancement of the brain parenchyma.  Flow is present in the major intracranial arteries.  The globes and orbits are intact.  The paranasal sinuses and mastoid air cells are clear.  The postcontrast images demonstrate no pathologic enhancement.  IMPRESSION:  1.  Bilateral periventricular and subcortical white matter lesions are compatible with the given diagnosis of multiple sclerosis. 2.  No restricted diffusion or enhancement to suggest acute demyelination.  This does not exclude  the possibility of acute demyelination.  MRI CERVICAL SPINE  Findings: Extensive white matter changes are present in the spinal cord on the sagittal STIR sequences as well as the axial gradient sequences.  There is a lesion at C2-3 measuring 14 mm and a lesion at C5 and 15 mm.  Extensive cord signal abnormality begins at T1-2 and descends.  There is no associated enhancement.  No significant disc herniation or stenosis is present.  Flow is present in the major vascular structures of the neck.  The soft tissues of the neck are unremarkable.  IMPRESSION:  1.  Areas of increased T2 hyperintensity in the spinal cord without expansion at C2-3 and C5 are compatible with demyelinating lesions. 2.  A left-sided lesion is present at C7 measuring 15 mm. 3.  No significant disc disease or stenosis.  MRI THORACIC SPINE WITHOUT AND WITH CONTRAST  Technique:  Multiplanar and multiecho pulse sequences of the thoracic spine were obtained without and with intravenous contrast.  Contrast: 15mL MULTIHANCE GADOBENATE DIMEGLUMINE 529 MG/ML IV SOLN  Findings:  Diffuse cord signal abnormality is present from T1 through T8-9.  There is no expansion of the cord.  There is focal enhancement of the ventral cord at T5-6 extending cephalocaudad for 12 mm.  No other pathologic enhancement of the cord is evident. The lower thoracic spinal cord is within normal limits.  The conus medullaris terminates at T12-L1, within normal limits.  Marrow signal, vertebral body heights, and alignment are normal.  No significant disc herniation or stenosis.  Small pleural effusions are present bilaterally.  The paraspinous soft tissues are otherwise within normal limits.  IMPRESSION:  1.  Diffuse cord signal abnormality T1-T9 is compatible with the given diagnosis of multiple sclerosis. 2.  Focal enhancement along the ventral surface of the cord at T5-6 extends cephalocaudad for 12 mm.  This may represent an area of acute demyelination.  No other pathologic  enhancement is present. 3.  Small bilateral pleural effusions.   Original Report Authenticated By: Marin Roberts, M.D.    Mr Cervical Spine W Wo Contrast  09/09/2012  *RADIOLOGY REPORT*  Clinical Data:  Multiple sclerosis.  Progressive bilateral lower extremity weakness and numbness, right greater than left.  Bladder and bowel incontinence.  MRI HEAD WITHOUT AND WITH CONTRAST MRI CERVICAL SPINE WITHOUT AND WITH CONTRAST  Technique:  Multiplanar, multiecho pulse sequences of the brain and surrounding structures, and cervical spine, to include the craniocervical junction and cervicothoracic junction, were obtained without and with intravenous contrast.  Contrast: 15mL MULTIHANCE GADOBENATE DIMEGLUMINE 529 MG/ML IV SOLN  Comparison:   None.  MRI HEAD  Findings:  Scattered periventricular and subcortical white matter lesions are present bilaterally.  Focal lesion within the medial left temporal lobe measures 8 mm.  There is no restricted diffusion or enhancement associated with these lesions.  Postcontrast images demonstrate no pathologic enhancement of the brain parenchyma.  Flow is present in the major intracranial arteries.  The globes and orbits are intact.  The paranasal sinuses and mastoid air cells are clear.  The postcontrast images demonstrate no pathologic enhancement.  IMPRESSION:  1.  Bilateral periventricular and subcortical white matter lesions are compatible with  the given diagnosis of multiple sclerosis. 2.  No restricted diffusion or enhancement to suggest acute demyelination.  This does not exclude the possibility of acute demyelination.  MRI CERVICAL SPINE  Findings: Extensive white matter changes are present in the spinal cord on the sagittal STIR sequences as well as the axial gradient sequences.  There is a lesion at C2-3 measuring 14 mm and a lesion at C5 and 15 mm.  Extensive cord signal abnormality begins at T1-2 and descends.  There is no associated enhancement.  No significant disc  herniation or stenosis is present.  Flow is present in the major vascular structures of the neck.  The soft tissues of the neck are unremarkable.  IMPRESSION:  1.  Areas of increased T2 hyperintensity in the spinal cord without expansion at C2-3 and C5 are compatible with demyelinating lesions. 2.  A left-sided lesion is present at C7 measuring 15 mm. 3.  No significant disc disease or stenosis.  MRI THORACIC SPINE WITHOUT AND WITH CONTRAST  Technique:  Multiplanar and multiecho pulse sequences of the thoracic spine were obtained without and with intravenous contrast.  Contrast: 15mL MULTIHANCE GADOBENATE DIMEGLUMINE 529 MG/ML IV SOLN  Findings:  Diffuse cord signal abnormality is present from T1 through T8-9.  There is no expansion of the cord.  There is focal enhancement of the ventral cord at T5-6 extending cephalocaudad for 12 mm.  No other pathologic enhancement of the cord is evident. The lower thoracic spinal cord is within normal limits.  The conus medullaris terminates at T12-L1, within normal limits.  Marrow signal, vertebral body heights, and alignment are normal.  No significant disc herniation or stenosis.  Small pleural effusions are present bilaterally.  The paraspinous soft tissues are otherwise within normal limits.  IMPRESSION:  1.  Diffuse cord signal abnormality T1-T9 is compatible with the given diagnosis of multiple sclerosis. 2.  Focal enhancement along the ventral surface of the cord at T5-6 extends cephalocaudad for 12 mm.  This may represent an area of acute demyelination.  No other pathologic enhancement is present. 3.  Small bilateral pleural effusions.   Original Report Authenticated By: Marin Roberts, M.D.    Mr Thoracic Spine W Wo Contrast  09/09/2012  *RADIOLOGY REPORT*  Clinical Data:  Multiple sclerosis.  Progressive bilateral lower extremity weakness and numbness, right greater than left.  Bladder and bowel incontinence.  MRI HEAD WITHOUT AND WITH CONTRAST MRI CERVICAL  SPINE WITHOUT AND WITH CONTRAST  Technique:  Multiplanar, multiecho pulse sequences of the brain and surrounding structures, and cervical spine, to include the craniocervical junction and cervicothoracic junction, were obtained without and with intravenous contrast.  Contrast: 15mL MULTIHANCE GADOBENATE DIMEGLUMINE 529 MG/ML IV SOLN  Comparison:   None.  MRI HEAD  Findings:  Scattered periventricular and subcortical white matter lesions are present bilaterally.  Focal lesion within the medial left temporal lobe measures 8 mm.  There is no restricted diffusion or enhancement associated with these lesions.  Postcontrast images demonstrate no pathologic enhancement of the brain parenchyma.  Flow is present in the major intracranial arteries.  The globes and orbits are intact.  The paranasal sinuses and mastoid air cells are clear.  The postcontrast images demonstrate no pathologic enhancement.  IMPRESSION:  1.  Bilateral periventricular and subcortical white matter lesions are compatible with the given diagnosis of multiple sclerosis. 2.  No restricted diffusion or enhancement to suggest acute demyelination.  This does not exclude the possibility of acute demyelination.  MRI CERVICAL SPINE  Findings: Extensive white matter changes are present in the spinal cord on the sagittal STIR sequences as well as the axial gradient sequences.  There is a lesion at C2-3 measuring 14 mm and a lesion at C5 and 15 mm.  Extensive cord signal abnormality begins at T1-2 and descends.  There is no associated enhancement.  No significant disc herniation or stenosis is present.  Flow is present in the major vascular structures of the neck.  The soft tissues of the neck are unremarkable.  IMPRESSION:  1.  Areas of increased T2 hyperintensity in the spinal cord without expansion at C2-3 and C5 are compatible with demyelinating lesions. 2.  A left-sided lesion is present at C7 measuring 15 mm. 3.  No significant disc disease or stenosis.  MRI  THORACIC SPINE WITHOUT AND WITH CONTRAST  Technique:  Multiplanar and multiecho pulse sequences of the thoracic spine were obtained without and with intravenous contrast.  Contrast: 15mL MULTIHANCE GADOBENATE DIMEGLUMINE 529 MG/ML IV SOLN  Findings:  Diffuse cord signal abnormality is present from T1 through T8-9.  There is no expansion of the cord.  There is focal enhancement of the ventral cord at T5-6 extending cephalocaudad for 12 mm.  No other pathologic enhancement of the cord is evident. The lower thoracic spinal cord is within normal limits.  The conus medullaris terminates at T12-L1, within normal limits.  Marrow signal, vertebral body heights, and alignment are normal.  No significant disc herniation or stenosis.  Small pleural effusions are present bilaterally.  The paraspinous soft tissues are otherwise within normal limits.  IMPRESSION:  1.  Diffuse cord signal abnormality T1-T9 is compatible with the given diagnosis of multiple sclerosis. 2.  Focal enhancement along the ventral surface of the cord at T5-6 extends cephalocaudad for 12 mm.  This may represent an area of acute demyelination.  No other pathologic enhancement is present. 3.  Small bilateral pleural effusions.   Original Report Authenticated By: Marin Roberts, M.D.     Medications: Scheduled Meds:   . baclofen  5 mg Oral BID  . enoxaparin (LOVENOX) injection  40 mg Subcutaneous Q24H  . iron polysaccharides  150 mg Oral Daily  . methylPREDNISolone (SOLU-MEDROL) injection  1,000 mg Intravenous Daily   Continuous Infusions:  PRN Meds:.acetaminophen, acetaminophen, alum & mag hydroxide-simeth, ondansetron (ZOFRAN) IV, ondansetron, senna-docusate, zolpidem  Assessment/Plan:  Principal Problem:  *Multiple sclerosis exacerbation Active Problems:  Anemia, iron deficiency  Urinary incontinence  Multiple sclerosis exacerbation Continue IV Solu-Medrol We'll start baclofen 5 mg by mouth twice a day for muscle spasms,  discussed with Dr. Cyril Mourning  DVT prophylaxis Lovenox   Family communication: Discussed with patient  Code status: Full code  Disposition: After the completion of course of IV Solu-Medrol in the hospital   LOS: 1 day    [facility] Pager: [phone] 09/10/2012, 11:10 AM

## 2012-09-11 MED ORDER — HYDROCODONE-ACETAMINOPHEN 5-325 MG PO TABS
1.0000 | ORAL_TABLET | ORAL | Status: DC | PRN
  Administered 2012-09-11: 1 via ORAL
  Filled 2012-09-11: qty 1

## 2012-09-11 MED ORDER — PREDNISONE 5 MG PO TABS: ORAL_TABLET | ORAL | Status: DC

## 2012-09-11 MED ORDER — BACLOFEN 5 MG HALF TABLET: 5.0000 mg | ORAL_TABLET | Freq: Two times a day (BID) | ORAL | Status: DC

## 2012-09-11 MED ORDER — HYDROCODONE-ACETAMINOPHEN 5-325 MG PO TABS: 1.0000 | ORAL_TABLET | ORAL | Status: DC | PRN

## 2012-09-11 NOTE — Evaluation (Signed)
Occupational Therapy Evaluation Patient Details Name: Sarah [name] MRN: [id number] DOB: 1981/01/04 Today's Date: 09/11/2012 Time: 1455-1530 OT Time Calculation (min): 35 min  OT Assessment / Plan / Recommendation Clinical Impression  This 32 y.o. female admitted for MS exacerbation.  Pt. demonstrates the below listed deficits and will benefit from continued OT to maximize safety and independence with BADLs to allow pt to return home at supervision level with family    OT Assessment  Patient needs continued OT Services    Follow Up Recommendations  No OT follow up;Supervision/Assistance - 24 hour    Barriers to Discharge None    Equipment Recommendations  Tub/shower bench    Recommendations for Other Services    Frequency  Min 2X/week    Precautions / Restrictions Precautions Precautions: Fall Restrictions Weight Bearing Restrictions: No       ADL  Eating/Feeding: Independent Where Assessed - Eating/Feeding: Chair Grooming: Wash/dry hands;Wash/dry face;Teeth care;Min guard Where Assessed - Grooming: Supported standing Upper Body Bathing: Set up Where Assessed - Upper Body Bathing: Unsupported sitting Lower Body Bathing: Min guard Where Assessed - Lower Body Bathing: Supported sit to stand Upper Body Dressing: Set up Where Assessed - Upper Body Dressing: Unsupported sitting Lower Body Dressing: Min guard Where Assessed - Lower Body Dressing: Supported sit to Pharmacist, hospital: Hydrographic surveyor Method: Sit to Barista: Comfort height toilet Toileting - Architect and Hygiene: Min guard Where Assessed - Engineer, mining and Hygiene: Standing Equipment Used: Rolling walker Transfers/Ambulation Related to ADLs: Pt ambulates with min guard assist with RW ADL Comments: Pt. reports she has been dealing with exacerbation x 3 weeks and she was worse than she is now.  Husband has been assisting her, and she has been  taking the but to work due to her car being out of service.  Discussed options/ideas to manage energy levels and increased safety such as ask co-workers or friends for ride to/from work.  Sit to prepare meals and fold clothing when possible; ask family for assistance; Pt reports he g-mother has a tub seat she can use; however, unsure what the seat looks like.  Instructed pt if she is able to sit back on seat and swing legs into tub, WITH husband's assist.  Discussed recommendation for tub transfer bench and CM notified; however, pt is self pay.  Discussed options for obtaining a bench i.e. Holiday representative, etc.  Pt verbalized understanding of all    OT Diagnosis: Generalized weakness  OT Problem List: Decreased strength;Impaired balance (sitting and/or standing);Decreased knowledge of use of DME or AE OT Treatment Interventions: Self-care/ADL training;Neuromuscular education;DME and/or AE instruction;Therapeutic activities;Patient/family education;Balance training   OT Goals Acute Rehab OT Goals OT Goal Formulation: With patient Time For Goal Achievement: 09/18/12 Potential to Achieve Goals: Good ADL Goals Pt Will Perform Grooming: with supervision;Standing at sink ADL Goal: Grooming - Progress: Goal set today Pt Will Perform Tub/Shower Transfer: with supervision;Transfer tub bench;Ambulation ADL Goal: Tub/Shower Transfer - Progress: Goal set today Additional ADL Goal #1: Pt will incorporate energy conserving strategies into daily activities independently ADL Goal: Additional Goal #1 - Progress: Goal set today  Visit Information  Last OT Received On: 09/11/12 Assistance Needed: +1    Subjective Data  Subjective: "I don't like to show weakness, so I've just been doing it"  re: taking a bus to work Patient Stated Goal: To get better   Prior Functioning     Home Living Lives With: Spouse;Family Available Help at  Discharge: Family;Available 24 hours/day Type of Home: Apartment Home  Access: Stairs to enter Entrance Stairs-Number of Steps: 2 Entrance Stairs-Rails: None Home Layout: One level Bathroom Shower/Tub: Forensic scientist: Standard Bathroom Accessibility: Yes How Accessible: Accessible via walker Home Adaptive Equipment: Walker - rolling;Bedside commode/3-in-1 Prior Function Level of Independence: Independent Able to Take Stairs?: Yes Driving: Yes Vocation: Full time employment Comments: works at [employer] Communication: No difficulties Dominant Hand: Right         Vision/Perception Vision - History Baseline Vision:  (Pt report she is at baseline) Patient Visual Report: No change from baseline Vision - Assessment Vision Assessment: Vision not tested Perception Perception: Within Functional Limits Praxis Praxis: Intact   Cognition  Cognition Overall Cognitive Status: Appears within functional limits for tasks assessed/performed Arousal/Alertness: Awake/alert Orientation Level: Appears intact for tasks assessed Behavior During Session: [facility] for tasks performed    Extremity/Trunk Assessment Right Upper Extremity Assessment RUE ROM/Strength/Tone: [facility] for tasks assessed (Pt reports she is at baseline from UE standpoint) RUE Coordination: WFL - gross/fine motor Left Upper Extremity Assessment LUE ROM/Strength/Tone: WFL for tasks assessed (Pt reports she is at baseline from a UE standpoint) LUE Coordination: WFL - gross/fine motor Trunk Assessment Trunk Assessment: Normal     Mobility Bed Mobility Bed Mobility: Not assessed Transfers Transfers: Sit to Stand;Stand to Sit Sit to Stand: 4: Min guard;With upper extremity assist;With armrests;From chair/3-in-1 Stand to Sit: 5: Supervision;With upper extremity assist;With armrests;To chair/3-in-1 Details for Transfer Assistance: pt uses compensatory locking of bilat Knees into hyperext to prevent bilat knee buckling or "give out from underneath me."      Exercise     Balance     End of Session OT - End of Session Equipment Utilized During Treatment: Gait belt Activity Tolerance: Patient tolerated treatment well Patient left: in chair;with call bell/phone within reach Nurse Communication: Other (comment) (follow up questions pt has for MD)  GO     Virgina Organ, Ursula Alert M 09/11/2012, 4:03 PM

## 2012-09-11 NOTE — Progress Notes (Signed)
NCM spoke to pt and she will be able to get a RW. She is requesting a 3n1 for home. No OT/PT needs identified at this time. Isidoro Donning RN CCM Case Mgmt phone [phone]

## 2012-09-11 NOTE — Clinical Social Work Psychosocial (Signed)
     Clinical Social Work Department BRIEF PSYCHOSOCIAL ASSESSMENT 09/11/2012  Patient:  Sarah [name], Sarah [name]     Account Number:  [id number]     Admit date:  09/09/2012  Clinical Social Worker:  Peggyann Shoals  Date/Time:  09/11/2012 01:39 PM  Referred by:  RN  Date Referred:  09/11/2012 Referred for  Other - See comment   Other Referral:   Financial resources.   Interview type:  Patient Other interview type:    PSYCHOSOCIAL DATA Living Status:  ALONE Admitted from facility:   Level of care:   Primary support name:  Seward Grater Primary support relationship to patient:  FRIEND Degree of support available:    CURRENT CONCERNS Current Concerns  Other - See comment   Other Concerns:   Financial Resources.    SOCIAL WORK ASSESSMENT / PLAN CSW received consult from Unit Director regarding pt's insurance status. CSW met with pt to address consult. CSW introduced herself and explained role of social work.    Pt shared that her diagnosis of MS if not new, and she was on Medicaid previously, however it lapsed. Pt shared that she was meeting with the financial counselor to address her medicaid.    Pt did not voice any other concerns. Pt will be discharging home today. CSW is signing off, as no further needs identified. Please reconsult if a need arises prior to discharge.   Assessment/plan status:  No Further Intervention Required Other assessment/ plan:   Information/referral to community resources:    PATIENTS/FAMILYS RESPONSE TO PLAN OF CARE: Pt was appreciative for CSW's follow up.

## 2012-09-11 NOTE — Progress Notes (Signed)
Physical Therapy Treatment Patient Details Name: Sarah [name] MRN: [id number] DOB: [birth year] Today's Date: 09/11/2012 Time: 4098-1191 PT Time Calculation (min): 31 min  PT Assessment / Plan / Recommendation Comments on Treatment Session  Pt 32 yo female with MS exacerbation presenting with balance impairments and decreased strength with bilat LE numbness now requiring increased assist for transfers for safety and use of RW for safe amb. Educated pt on energy conservation and recommended outpt PT however pt currently self pay and without a car. Discussed option of neighbors and co-wokers to assist with rides to work to limit use of public transportation and extremely long walks to work. Pt with 24/7 assist at home via spouse but also has 3 kids at home (9,7,4). Pt motivated and desires to return to work ASAP.    Follow Up Recommendations  Outpatient PT;Supervision/Assistance - 24 hour     Does the patient have the potential to tolerate intense rehabilitation     Barriers to Discharge        Equipment Recommendations       Recommendations for Other Services    Frequency Min 3X/week   Plan Discharge plan remains appropriate;Frequency remains appropriate    Precautions / Restrictions Precautions Precautions: Fall Restrictions Weight Bearing Restrictions: No   Pertinent Vitals/Pain     Mobility  Bed Mobility Bed Mobility: Not assessed Transfers Transfers: Sit to Stand;Stand to Sit Sit to Stand: 4: Min guard;With upper extremity assist;With armrests;From chair/3-in-1 Stand to Sit: 5: Supervision;With upper extremity assist;With armrests;To chair/3-in-1 Details for Transfer Assistance: pt uses compensatory locking of bilat Knees into hyperext to prevent bilat knee buckling or "give out from underneath me." Ambulation/Gait Ambulation/Gait Assistance: 4: Min guard Ambulation Distance (Feet): 60 Feet Assistive device: Rolling walker Ambulation/Gait Assistance Details: pt slow with  increased bilat UE wbing. pt cautious due to fear of falling. Pt with no LOB however requires use of RW for safe ambulation. pt con't to have extensor tone limited hip/knee/ankle flex bilat. Gait velocity: guarded/cautious General Gait Details: per pt, pt used to lean up against walls in house to amb. pt to strongly benefit from RW for safe amb Stairs: No    Exercises     PT Diagnosis:    PT Problem List:   PT Treatment Interventions:     PT Goals Acute Rehab PT Goals PT Goal: Sit to Stand - Progress: Progressing toward goal PT Goal: Stand to Sit - Progress: Progressing toward goal PT Goal: Ambulate - Progress: Progressing toward goal  Visit Information  Last PT Received On: 09/11/12 Assistance Needed: +1    Subjective Data  Subjective: Pt received sitting up in chair agreeable to PT. Patient Stated Goal: home   Cognition  Cognition Overall Cognitive Status: Appears within functional limits for tasks assessed/performed Arousal/Alertness: Awake/alert Orientation Level: Appears intact for tasks assessed Behavior During Session: [facility] for tasks performed    Balance     End of Session PT - End of Session Equipment Utilized During Treatment: Gait belt Activity Tolerance: Patient tolerated treatment well Patient left: in chair;with call bell/phone within reach Nurse Communication: Mobility status   GP     Sarah [name] 09/11/2012, 3:49 PM   Sarah [name], PT, DPT Pager #: [phone] Office #: [phone]

## 2012-09-11 NOTE — Discharge Summary (Signed)
Physician Discharge Summary  Neko Mcgeehan BJY:[id number] DOB: Jun 25, 1981 DOA: September 10, 2012  PCP: Default, Provider, MD  Admit date: 09/10/2012 Discharge date: 09/11/2012  Time spent: 50 minutes  Recommendations for Outpatient Follow-up:  1. Follow up with [facility] neurology as outpatient  Discharge Diagnoses:  Principal Problem:  *Multiple sclerosis exacerbation Active Problems:  Anemia, iron deficiency  Urinary incontinence   Discharge Condition: Stable  Diet recommendation: Regular  Filed Weights   2012/09/10 2053  Weight: 135.036 kg (297 lb 11.2 oz)    History of present illness:  32 y.o. female with history of multiple sclerosis, who was followed at [facility] until 2011, presented to the emergency room today with progressive urinary incontinence, alternating with urinary retention, bilateral lower extremity numbness and weakness. She denies double vision, difficulty swallowing, difficulty breathing. Her prior episodes were manifested by up the neuritis in 2002 and right leg weakness in 2011. She used to be on Betaseron but currently is on no medications for multiple sclerosis. The patient was seen by the neurologist in the emergency room, started on pulse dose IV steroids and referred for admission.   Hospital Course:   Multiple sclerosis exacerbation Patient received high-dose IV Solu-Medrol for 3 days. At this time neurology recommends to send the patient home on prednisone taper for next 10 days. Patient has improved. Also she was started on baclofen for the muscle spasticity.   Procedures:  None  Consultations:  Neurology  Discharge Exam: Filed Vitals:   09/10/12 1021 09/10/12 1808 09/10/12 2104 09/11/12 0559  BP: 112/58 127/75 120/62 123/64  Pulse: 65 77 77 72  Temp: 98.1 F (36.7 C) 97.9 F (36.6 C) 98.1 F (36.7 C) 98.2 F (36.8 C)  TempSrc: Oral Oral Oral Oral  Resp: 18 18 18 20   Height:      Weight:      SpO2: 100% 100% 100% 100%    General: Appearing no  acute distress Cardiovascular:  S1-S2 regular Respiratory: Clear bilaterally Extremities: No edema  Discharge Instructions     Medication List     As of 09/11/2012 10:54 AM    TAKE these medications         baclofen 5 mg Tabs   Commonly known as: LIORESAL   Take 0.5 tablets (5 mg total) by mouth 2 (two) times daily.      magnesium gluconate 500 MG tablet   Commonly known as: MAGONATE   Take 250 mg by mouth daily.      predniSONE 5 MG tablet   Commonly known as: DELTASONE   Prednisone 40 mg po daily x 2 day then  Prednisone 30 mg po daily x 2 day then  Prednisone 20 mg po daily x 2 day then  Prednisone 10 mg po daily x 2  day then stop...           Follow-up Information    Follow up with Evie Lacks, MD. (Call to make appointment)    Contact information:   [address] [place] [address] [phone]           The results of significant diagnostics from this hospitalization (including imaging, microbiology, ancillary and laboratory) are listed below for reference.    Significant Diagnostic Studies: Mr Laqueta Jean UX Contrast  Sep 10, 2012  *RADIOLOGY REPORT*  Clinical Data:  Multiple sclerosis.  Progressive bilateral lower extremity weakness and numbness, right greater than left.  Bladder and bowel incontinence.  MRI HEAD WITHOUT AND WITH CONTRAST MRI CERVICAL SPINE WITHOUT AND WITH  CONTRAST  Technique:  Multiplanar, multiecho pulse sequences of the brain and surrounding structures, and cervical spine, to include the craniocervical junction and cervicothoracic junction, were obtained without and with intravenous contrast.  Contrast: 15mL MULTIHANCE GADOBENATE DIMEGLUMINE 529 MG/ML IV SOLN  Comparison:   None.  MRI HEAD  Findings:  Scattered periventricular and subcortical white matter lesions are present bilaterally.  Focal lesion within the medial left temporal lobe measures 8 mm.  There is no restricted diffusion or enhancement associated with these lesions.   Postcontrast images demonstrate no pathologic enhancement of the brain parenchyma.  Flow is present in the major intracranial arteries.  The globes and orbits are intact.  The paranasal sinuses and mastoid air cells are clear.  The postcontrast images demonstrate no pathologic enhancement.  IMPRESSION:  1.  Bilateral periventricular and subcortical white matter lesions are compatible with the given diagnosis of multiple sclerosis. 2.  No restricted diffusion or enhancement to suggest acute demyelination.  This does not exclude the possibility of acute demyelination.  MRI CERVICAL SPINE  Findings: Extensive white matter changes are present in the spinal cord on the sagittal STIR sequences as well as the axial gradient sequences.  There is a lesion at C2-3 measuring 14 mm and a lesion at C5 and 15 mm.  Extensive cord signal abnormality begins at T1-2 and descends.  There is no associated enhancement.  No significant disc herniation or stenosis is present.  Flow is present in the major vascular structures of the neck.  The soft tissues of the neck are unremarkable.  IMPRESSION:  1.  Areas of increased T2 hyperintensity in the spinal cord without expansion at C2-3 and C5 are compatible with demyelinating lesions. 2.  A left-sided lesion is present at C7 measuring 15 mm. 3.  No significant disc disease or stenosis.  MRI THORACIC SPINE WITHOUT AND WITH CONTRAST  Technique:  Multiplanar and multiecho pulse sequences of the thoracic spine were obtained without and with intravenous contrast.  Contrast: 15mL MULTIHANCE GADOBENATE DIMEGLUMINE 529 MG/ML IV SOLN  Findings:  Diffuse cord signal abnormality is present from T1 through T8-9.  There is no expansion of the cord.  There is focal enhancement of the ventral cord at T5-6 extending cephalocaudad for 12 mm.  No other pathologic enhancement of the cord is evident. The lower thoracic spinal cord is within normal limits.  The conus medullaris terminates at T12-L1, within  normal limits.  Marrow signal, vertebral body heights, and alignment are normal.  No significant disc herniation or stenosis.  Small pleural effusions are present bilaterally.  The paraspinous soft tissues are otherwise within normal limits.  IMPRESSION:  1.  Diffuse cord signal abnormality T1-T9 is compatible with the given diagnosis of multiple sclerosis. 2.  Focal enhancement along the ventral surface of the cord at T5-6 extends cephalocaudad for 12 mm.  This may represent an area of acute demyelination.  No other pathologic enhancement is present. 3.  Small bilateral pleural effusions.   Original Report Authenticated By: Marin Roberts, M.D.    Mr Cervical Spine W Wo Contrast  09/09/2012  *RADIOLOGY REPORT*  Clinical Data:  Multiple sclerosis.  Progressive bilateral lower extremity weakness and numbness, right greater than left.  Bladder and bowel incontinence.  MRI HEAD WITHOUT AND WITH CONTRAST MRI CERVICAL SPINE WITHOUT AND WITH CONTRAST  Technique:  Multiplanar, multiecho pulse sequences of the brain and surrounding structures, and cervical spine, to include the craniocervical junction and cervicothoracic junction, were obtained without and with intravenous contrast.  Contrast: 15mL MULTIHANCE GADOBENATE DIMEGLUMINE 529 MG/ML IV SOLN  Comparison:   None.  MRI HEAD  Findings:  Scattered periventricular and subcortical white matter lesions are present bilaterally.  Focal lesion within the medial left temporal lobe measures 8 mm.  There is no restricted diffusion or enhancement associated with these lesions.  Postcontrast images demonstrate no pathologic enhancement of the brain parenchyma.  Flow is present in the major intracranial arteries.  The globes and orbits are intact.  The paranasal sinuses and mastoid air cells are clear.  The postcontrast images demonstrate no pathologic enhancement.  IMPRESSION:  1.  Bilateral periventricular and subcortical white matter lesions are compatible with the given  diagnosis of multiple sclerosis. 2.  No restricted diffusion or enhancement to suggest acute demyelination.  This does not exclude the possibility of acute demyelination.  MRI CERVICAL SPINE  Findings: Extensive white matter changes are present in the spinal cord on the sagittal STIR sequences as well as the axial gradient sequences.  There is a lesion at C2-3 measuring 14 mm and a lesion at C5 and 15 mm.  Extensive cord signal abnormality begins at T1-2 and descends.  There is no associated enhancement.  No significant disc herniation or stenosis is present.  Flow is present in the major vascular structures of the neck.  The soft tissues of the neck are unremarkable.  IMPRESSION:  1.  Areas of increased T2 hyperintensity in the spinal cord without expansion at C2-3 and C5 are compatible with demyelinating lesions. 2.  A left-sided lesion is present at C7 measuring 15 mm. 3.  No significant disc disease or stenosis.  MRI THORACIC SPINE WITHOUT AND WITH CONTRAST  Technique:  Multiplanar and multiecho pulse sequences of the thoracic spine were obtained without and with intravenous contrast.  Contrast: 15mL MULTIHANCE GADOBENATE DIMEGLUMINE 529 MG/ML IV SOLN  Findings:  Diffuse cord signal abnormality is present from T1 through T8-9.  There is no expansion of the cord.  There is focal enhancement of the ventral cord at T5-6 extending cephalocaudad for 12 mm.  No other pathologic enhancement of the cord is evident. The lower thoracic spinal cord is within normal limits.  The conus medullaris terminates at T12-L1, within normal limits.  Marrow signal, vertebral body heights, and alignment are normal.  No significant disc herniation or stenosis.  Small pleural effusions are present bilaterally.  The paraspinous soft tissues are otherwise within normal limits.  IMPRESSION:  1.  Diffuse cord signal abnormality T1-T9 is compatible with the given diagnosis of multiple sclerosis. 2.  Focal enhancement along the ventral surface  of the cord at T5-6 extends cephalocaudad for 12 mm.  This may represent an area of acute demyelination.  No other pathologic enhancement is present. 3.  Small bilateral pleural effusions.   Original Report Authenticated By: Marin Roberts, M.D.    Mr Thoracic Spine W Wo Contrast  09/09/2012  *RADIOLOGY REPORT*  Clinical Data:  Multiple sclerosis.  Progressive bilateral lower extremity weakness and numbness, right greater than left.  Bladder and bowel incontinence.  MRI HEAD WITHOUT AND WITH CONTRAST MRI CERVICAL SPINE WITHOUT AND WITH CONTRAST  Technique:  Multiplanar, multiecho pulse sequences of the brain and surrounding structures, and cervical spine, to include the craniocervical junction and cervicothoracic junction, were obtained without and with intravenous contrast.  Contrast: 15mL MULTIHANCE GADOBENATE DIMEGLUMINE 529 MG/ML IV SOLN  Comparison:   None.  MRI HEAD  Findings:  Scattered periventricular and subcortical white matter lesions are present bilaterally.  Focal  lesion within the medial left temporal lobe measures 8 mm.  There is no restricted diffusion or enhancement associated with these lesions.  Postcontrast images demonstrate no pathologic enhancement of the brain parenchyma.  Flow is present in the major intracranial arteries.  The globes and orbits are intact.  The paranasal sinuses and mastoid air cells are clear.  The postcontrast images demonstrate no pathologic enhancement.  IMPRESSION:  1.  Bilateral periventricular and subcortical white matter lesions are compatible with the given diagnosis of multiple sclerosis. 2.  No restricted diffusion or enhancement to suggest acute demyelination.  This does not exclude the possibility of acute demyelination.  MRI CERVICAL SPINE  Findings: Extensive white matter changes are present in the spinal cord on the sagittal STIR sequences as well as the axial gradient sequences.  There is a lesion at C2-3 measuring 14 mm and a lesion at C5 and 15 mm.   Extensive cord signal abnormality begins at T1-2 and descends.  There is no associated enhancement.  No significant disc herniation or stenosis is present.  Flow is present in the major vascular structures of the neck.  The soft tissues of the neck are unremarkable.  IMPRESSION:  1.  Areas of increased T2 hyperintensity in the spinal cord without expansion at C2-3 and C5 are compatible with demyelinating lesions. 2.  A left-sided lesion is present at C7 measuring 15 mm. 3.  No significant disc disease or stenosis.  MRI THORACIC SPINE WITHOUT AND WITH CONTRAST  Technique:  Multiplanar and multiecho pulse sequences of the thoracic spine were obtained without and with intravenous contrast.  Contrast: 15mL MULTIHANCE GADOBENATE DIMEGLUMINE 529 MG/ML IV SOLN  Findings:  Diffuse cord signal abnormality is present from T1 through T8-9.  There is no expansion of the cord.  There is focal enhancement of the ventral cord at T5-6 extending cephalocaudad for 12 mm.  No other pathologic enhancement of the cord is evident. The lower thoracic spinal cord is within normal limits.  The conus medullaris terminates at T12-L1, within normal limits.  Marrow signal, vertebral body heights, and alignment are normal.  No significant disc herniation or stenosis.  Small pleural effusions are present bilaterally.  The paraspinous soft tissues are otherwise within normal limits.  IMPRESSION:  1.  Diffuse cord signal abnormality T1-T9 is compatible with the given diagnosis of multiple sclerosis. 2.  Focal enhancement along the ventral surface of the cord at T5-6 extends cephalocaudad for 12 mm.  This may represent an area of acute demyelination.  No other pathologic enhancement is present. 3.  Small bilateral pleural effusions.   Original Report Authenticated By: Marin Roberts, M.D.     Microbiology: No results found for this or any previous visit (from the past 240 hour(s)).   Labs: Basic Metabolic Panel:  Lab 09/10/12 1610  09/09/12 1530  NA 137 139  K 4.0 3.5  CL 103 102  CO2 24 27  GLUCOSE 146* 95  BUN 9 10  CREATININE 0.60 0.64  CALCIUM 9.6 9.2  MG -- --  PHOS -- --   Liver Function Tests:  Lab 09/09/12 1530  AST 13  ALT 11  ALKPHOS 77  BILITOT 0.2*  PROT 7.7  ALBUMIN 3.8   No results found for this basename: LIPASE:5,AMYLASE:5 in the last 168 hours No results found for this basename: AMMONIA:5 in the last 168 hours CBC:  Lab 09/10/12 0726 09/09/12 1530  WBC 7.1 6.8  NEUTROABS -- 4.0  HGB 12.4 11.5*  HCT 37.1 34.1*  MCV 90.7 91.4  PLT 307 264   Cardiac Enzymes: No results found for this basename: CKTOTAL:5,CKMB:5,CKMBINDEX:5,TROPONINI:5 in the last 168 hours BNP: BNP (last 3 results) No results found for this basename: PROBNP:3 in the last 8760 hours CBG: No results found for this basename: GLUCAP:5 in the last 168 hours     Signed:  Nadra Hritz S  [facility] 09/11/2012, 10:54 AM

## 2012-10-30 ENCOUNTER — Other Ambulatory Visit: Payer: Self-pay | Admitting: Neurology

## 2012-10-30 DIAGNOSIS — G35 Multiple sclerosis: Secondary | ICD-10-CM

## 2012-11-01 ENCOUNTER — Ambulatory Visit (INDEPENDENT_AMBULATORY_CARE_PROVIDER_SITE_OTHER): Payer: Self-pay

## 2012-11-01 DIAGNOSIS — G35 Multiple sclerosis: Secondary | ICD-10-CM

## 2012-11-01 MED ORDER — GADOPENTETATE DIMEGLUMINE 469.01 MG/ML IV SOLN: 18.0000 mL | Freq: Once | INTRAVENOUS | Status: AC | PRN

## 2012-11-09 ENCOUNTER — Ambulatory Visit (INDEPENDENT_AMBULATORY_CARE_PROVIDER_SITE_OTHER): Payer: Self-pay | Admitting: Neurology

## 2012-11-09 VITALS — BP 113/68 | HR 73 | Temp 98.6°F | Resp 17 | Wt 196.0 lb

## 2012-11-09 DIAGNOSIS — G35 Multiple sclerosis: Secondary | ICD-10-CM

## 2012-11-09 NOTE — Progress Notes (Signed)
Pt is seen today for V2 baseline PreferMS visit. Pt continues to meet inc./exc criteria  PT reports no new symptoms today , no AEs and no other new conmeds.Pt. is randomized to Rebif. All labs, PROs have been completed as per protocol. Dr.Yan in to complete ambulation assessment, neuro and physical exam. Pt is educated on the proper storage and administration of Copaxone. PT is also educated on the possible side effects of Copaxone. Pt demonstrates understanding and proper injection technique and administed first dose in clinic today. Follow up appointments given to pt.

## 2012-11-09 NOTE — Progress Notes (Signed)
31 RH AAF with RRMS. In PREFER trial, was randomized to Copaxone April 3rd, 2014.  History: She was diagnosed with MS in 2002, presented with right optic neuritis, was treated at [facility], with steroid, later betaseron. She did very well, but had still born at week 22 while using betaseron before and during pregnancy,  betaseron was on hold during her later preganancies, 1st born 07/2003, second born August 2006,  third born April 2009 twins ( one of the twin girl died later).     She had major flare up in summer 2007, presented with four limbs paresthesia, weakness, difficulty walking, was put on steroid and betaseron transiently.  She described flu like illness, nause, vomitting, was on hold again. It was too expensive for her.  3rd major flare up was in Jan 2011, she has signficant weakness, four limb paresthesia, could not walk, drive for 2 months. Silvestre Moment was discussed with her but she is hesitate because worry about side effect of PML.  Feb 2014, she has right eye pain, blurry vsion and right arm/leg weakness, increased gait difficulty, also constipation, bladder incontinence, she is now better with steroid,.  I have reviewed films at [facility]. MRI brain Bilateral periventricular and subcortical white matter lesions are compatible with the given diagnosis of multiple sclerosis.  No restricted diffusion or enhancement to suggest acute demyelination.     MRI cervical Areas of increased T2 hyperintensity in the spinal cord without expansion at C2-3 and C5 are compatible with demyelinating lesions.  A left-sided lesion is present at C7 measuring 15 mm.  No significant disc disease or stenosis.  MRI thoracic:  Diffuse cord signal abnormality T1-T9 is compatible with the given diagnosis of multiple sclerosis.  Focal enhancement along the ventral surface of the cord at T5-6 extends cephalocaudad for 12 mm.   Labs showed mild anemia Hg 11.5, normal BMP  Her questions about copaxone were answered.    Physical Exam  General: normal. Head: normal cephalic Ears, Nose and Throat: normal. Neck: supple no carotid bruits Respiratory: clear to auscultation bilaterally Cardiovascular: regular rate rhythm Musculoskeletal: no deformity Skin: normal. Trunk: normal.  Neurologic Exam  Mental Status: pleasant, awake, alert, cooperative to history, talking, and casual conversation. Cranial Nerves: CN II-XII pupils were equal round reactive to light.  Fundi were sharp bilaterally.  Extraocular movements were full.  Visual fields were full on confrontational test.  Facial sensation and strength were normal.  Hearing was intact to finger rubbing bilaterally.  Uvula tongue were midline.  Head turning and shoulder shrugging were normal and symmetric.  Tongue protrusion into the cheeks strength were normal.  Motor: mild right arm weakness 4+/5, right hip flexion 4+/5 ,right knee flexion 5-/5. Sensory: length dependent decreased light touch, pinprick to calf level, preserved toe proprioception, and decreased toe vibratory sensation. Coordination: Normal finger-to-nose, heel-to-shin.  There was no dysmetria noticed. Gait and Station: stiff, she was able to walk on her heels and tip toe, mild difficulty with tandem walking.. Romberg sign: Negative Reflexes: Deep tendon reflexes: Biceps: 2/2, Brachioradialis: 2/2, Triceps: 2/2, Pateller: 3/3, Achilles: 2/2.  Plantar responses are flexor.

## 2012-11-17 ENCOUNTER — Telehealth: Payer: Self-pay | Admitting: Neurology

## 2012-11-17 NOTE — Telephone Encounter (Signed)
I have discussed with Sarah [name], she may try hydrocortisone cream or benadryl 25mg  as needed. Call back for worsening rash, itching,

## 2012-11-17 NOTE — Telephone Encounter (Signed)
Patient call and stated that she is experiencing some skin rashes on both her arms and neck. Would like to know what you suggest that she use. You can call at [phone] or at work at [phone] ext. [phone].

## 2012-12-12 ENCOUNTER — Other Ambulatory Visit: Payer: Self-pay | Admitting: Neurology

## 2012-12-12 ENCOUNTER — Ambulatory Visit (INDEPENDENT_AMBULATORY_CARE_PROVIDER_SITE_OTHER): Payer: Self-pay | Admitting: Neurology

## 2012-12-12 VITALS — BP 127/82 | HR 91 | Temp 98.7°F | Resp 18 | Wt 200.8 lb

## 2012-12-12 DIAGNOSIS — G35 Multiple sclerosis: Secondary | ICD-10-CM

## 2012-12-12 MED ORDER — METHYLPREDNISOLONE (PAK) 4 MG PO TABS: ORAL_TABLET | ORAL | Status: DC

## 2012-12-12 NOTE — Progress Notes (Signed)
Pt. is seen today for V3 month 1 PreferMS study visit. Pt is randomized to Copaxone and tolerating study medication well. Pt report no AE's or SAE's at this time, but do states that she thinks that she is having a relapse.  Dr Terrace Arabia in to complete neuro, physical exams, ambulation and bradycardia was assessed. Dr. Terrace Arabia confirmed that the patient was having a relapse and prescribe her Medrol pack. Pt return all used stripes and a new prescription was faxed in for Copaxone. Pt was advised to call our office id she has any questions or concerns. Pt verbalize that she understood. Pt provided with Eye appointment and next study visit appointment times.

## 2012-12-12 NOTE — Progress Notes (Signed)
31 RH AAF with RRMS. In PREFER trial, was randomized to Copaxone April 3rd, 2014.  History: She was diagnosed with MS in 2002, presented with right optic neuritis, was treated at [facility], with steroid, later betaseron. She did very well, but had still born at week 22 while using betaseron before and during pregnancy,  betaseron was on hold during her later preganancies, 1st born 07/2003, second born August 2006,  third born April 2009 twins ( one of the twin girl died later).     She had major flare up in summer 2007, presented with four limbs paresthesia, weakness, difficulty walking, was put on steroid and betaseron transiently.  She described flu like illness, nause, vomitting, was on hold again. It was too expensive for her.  3rd major flare up was in Jan 2011, she has signficant weakness, four limb paresthesia, could not walk, drive for 2 months. Silvestre Moment was discussed with her but she is hesitate because worry about side effect of PML.  Feb 2014, she has right eye pain, blurry vsion and right arm/leg weakness, increased gait difficulty, also constipation, bladder incontinence, she is now better with steroid.  I have reviewed films at [facility].  MRI brain Bilateral periventricular and subcortical white matter lesions are compatible with the given diagnosis of multiple sclerosis.  No restricted diffusion or enhancement to suggest acute demyelination.     MRI cervical Areas of increased T2 hyperintensity in the spinal cord without expansion at C2-3 and C5 are compatible with demyelinating lesions.  A left-sided lesion is present at C7 measuring 15 mm.  No significant disc disease or stenosis.  MRI thoracic:  Diffuse cord signal abnormality T1-T9 is compatible with the given diagnosis of multiple sclerosis.  Focal enhancement along the ventral surface of the cord at T5-6 extends cephalocaudad for 12 mm.   Labs showed mild anemia Hg 11.5, normal BMP  Her questions about copaxone were answered.     UPDATE May 5th 2014:  She complains 2 weeks history of worsening gait difficulty, also has bladder incontinence, bilateral lower extremity weakness numbness, extreme fatigue, she has to catch a cab to go to work each day.  Physical Exam  General: normal. Head: normal cephalic Ears, Nose and Throat: normal. Neck: supple no carotid bruits Respiratory: clear to auscultation bilaterally Cardiovascular: regular rate rhythm Musculoskeletal: no deformity Skin: normal. Trunk: normal.  Neurologic Exam  Mental Status: pleasant, awake, alert, cooperative to history, talking, and casual conversation. Cranial Nerves: CN II-XII pupils were equal round reactive to light.  Fundi were sharp bilaterally.  Extraocular movements were full.  Visual fields were full on confrontational test.  Facial sensation and strength were normal.  Hearing was intact to finger rubbing bilaterally.  Uvula tongue were midline.  Head turning and shoulder shrugging were normal and symmetric.  Tongue protrusion into the cheeks strength were normal.  Motor: mild right arm weakness, proximal and distal 4+/5,  Moderate bilateral lower extremity spasticity,  hip flexion 4/4 ,knee extension 4/4, knee flexion 5/5, ankle dorsiflexion 5/5. Sensory: length dependent decreased light touch, pinprick to above knee level, absent toe proprioception, and absent toe vibratory sensation. Coordination: Normal finger-to-nose, heel-to-shin.  There was no dysmetria noticed. Gait and Station: wide based, unsteady gait. Reflexes: Deep tendon reflexes: Biceps: 1/1, Brachioradialis: 1/1, Triceps: 2/2, Pateller: 3/3, Achilles: 2/2.  Plantar responses are flexor.  Assessment and plan: 32 years old [place] American female, with past medical history of relapsing remitting multiple sclerosis, was randomized to Copaxone since April 3rd 2014, she  had significant gait difficulty, consistent with a multiple sclerosis flareup  1.  Medrol pack. 2. continue  Copaxone sq.

## 2013-01-25 ENCOUNTER — Encounter: Payer: Self-pay | Admitting: Neurology

## 2013-01-25 ENCOUNTER — Ambulatory Visit (INDEPENDENT_AMBULATORY_CARE_PROVIDER_SITE_OTHER): Payer: Self-pay | Admitting: Neurology

## 2013-01-25 ENCOUNTER — Other Ambulatory Visit (INDEPENDENT_AMBULATORY_CARE_PROVIDER_SITE_OTHER): Payer: Self-pay

## 2013-01-25 ENCOUNTER — Other Ambulatory Visit: Payer: Self-pay

## 2013-01-25 DIAGNOSIS — G35 Multiple sclerosis: Secondary | ICD-10-CM

## 2013-01-25 MED ORDER — BACLOFEN 10 MG PO TABS: 5.0000 mg | ORAL_TABLET | Freq: Two times a day (BID) | ORAL | Status: DC

## 2013-01-25 MED ORDER — DIVALPROEX SODIUM ER 500 MG PO TB24: 500.0000 mg | ORAL_TABLET | Freq: Every day | ORAL | Status: DC

## 2013-01-25 MED ORDER — GADOPENTETATE DIMEGLUMINE 469.01 MG/ML IV SOLN: 19.0000 mL | Freq: Once | INTRAVENOUS | Status: AC | PRN

## 2013-01-25 NOTE — Progress Notes (Signed)
31 RH AAF with RRMS. In PREFER trial, was randomized to Copaxone April 3rd, 2014, now cross over to [place] today June 19th 2014.  History: She was diagnosed with MS in 2002, presented with right optic neuritis, was treated at [facility], with steroid, later betaseron. She did very well, but had still born at week 22 while using betaseron before and during pregnancy,  betaseron was on hold during her later preganancies, 1st born 07/2003, second born August 2006,  third born April 2009 twins ( one of the twin girl died later).     She had major flare up in summer 2007, presented with four limbs paresthesia, weakness, difficulty walking, was put on steroid and betaseron transiently.  She described flu like illness, nause, vomitting, was on hold again. It was too expensive for her.  3rd major flare up was in Jan 2011, she has signficant weakness, four limb paresthesia, could not walk, drive for 2 months. Silvestre Moment was discussed with her but she is hesitate because worry about side effect of PML.  Feb 2014, she has right eye pain, blurry vsion and right arm/leg weakness, increased gait difficulty, also constipation, bladder incontinence, she is now better with steroid.  I have reviewed films at [facility].  MRI brain Bilateral periventricular and subcortical white matter lesions are compatible with the given diagnosis of multiple sclerosis.  No restricted diffusion or enhancement to suggest acute demyelination.     MRI cervical Areas of increased T2 hyperintensity in the spinal cord without expansion at C2-3 and C5 are compatible with demyelinating lesions.  A left-sided lesion is present at C7 measuring 15 mm.  No significant disc disease or stenosis.  MRI thoracic:  Diffuse cord signal abnormality T1-T9 is compatible with the given diagnosis of multiple sclerosis.  Focal enhancement along the ventral surface of the cord at T5-6 extends cephalocaudad for 12 mm.   Labs showed mild anemia Hg 11.5, normal BMP  Her  questions about copaxone were answered.    UPDATE June 18th 2014: Her gait difficulty has improved with a nitroglycerin patch treatment, she still works full-time, has small children at age 27 7 and 12 at home, she has to catch the bus to work every day, with her gait difficulty she also complains of mild constipation, urinary urgency, urinary frequency, retention, difficulty starting urine, she has difficulty sleeping at nighttime, frequent bitemporal, right parietal area headaches. She complains of bilateral feet numbness, could not feel the ground,   Physical Exam  General: normal. Head: normal cephalic Ears, Nose and Throat: normal. Neck: supple no carotid bruits Respiratory: clear to auscultation bilaterally Cardiovascular: regular rate rhythm Musculoskeletal: no deformity Skin: normal. Trunk: normal.  Neurologic Exam  Mental Status: pleasant, awake, alert, cooperative to history, talking, and casual conversation. Cranial Nerves: CN II-XII pupils were equal round reactive to light.  Fundi were sharp bilaterally.  Extraocular movements were full.  Visual fields were full on confrontational test.  Facial sensation and strength were normal.  Hearing was intact to finger rubbing bilaterally.  Uvula tongue were midline.  Head turning and shoulder shrugging were normal and symmetric.  Tongue protrusion into the cheeks strength were normal.  Motor:Shoulder abduction 4/4+, elbow flexion 4+/5, elbow extension 4+/5, wrist flexion 5/5 wrist extension5/5, grip 4+/5, hip flexion 4/4+, knee flexion 4+/5, knee extension 4+/5, ankle dorsiflexion 4+/5 ,   Sensory: length dependent decreased light touch, pinprick to above midcalf level, absent toe proprioception, and absent toe vibratory sensation. Coordination: Normal finger-to-nose, heel-to-shin.  There was no  dysmetria noticed. Positive romberg signs Gait and Station: wide based, unsteady gait, could not walk on heels, tiptoe, could not do  tandem. Reflexes: Deep tendon reflexes: Biceps: 1/1, Brachioradialis: 1/1, Triceps: 2/2, Pateller: 3/3, Achilles: 2/2.  Plantar responses are extensor bilaterally.  Assessment and plan: 32 years old [place] American female, with past medical history of relapsing remitting multiple sclerosis, was randomized to Copaxone since April 3rd 2014, now cross over to [place].  1.  Continue Gil[place]ya. 2.  Depakote Qhs as headache prevention

## 2013-01-26 NOTE — Progress Notes (Signed)
Quick Note:  Please let her know, MRI brain showed no changes ______

## 2013-01-31 NOTE — Progress Notes (Unsigned)
Subjective:    Patient ID: Sarah [name] is a 32 y.o. female.  HPI N/A {Common ambulatory SmartLinks:19316}  Review of Systems N/A  Objective:  Neurologic Exam N/A  Physical Exam N/A  Assessment:  Patient was seen today for the Unscheduled visit in the PREFER trial. Patient crossed over to [place] and administered medication by study coordinator after meeting inclusion/exclusion criteria. Patient was monitored by study coordinator and hourly vital signs were recorded. Vital signs remained stable, without drop in blood pressure or pulse rate. Observation procedures Labs, PRO's , EKG completed as per protocol. PT has had no MS relapses and continues to meet inclusion criteria. Dr. Terrace Arabia in to complete neuro and physical exam. Pt. dispensed bottles #[id number], [id number] and [id number] and first dose given at 10:30am. Pt completed protocol required first dose observation period. Post dose EKG completed and pt was seen again by Dr. Terrace Arabia. Pt is without symptomatic baradycardia and preliminary EKG report returned as normal. Pt has been educated on the importance of compliance and a medication guide has been provided to the pt. Pt. was given an appointment for her Visit 5/month 6 per study protocol.     Plan:   Continue in PREFER MS study as per protocol.

## 2013-02-08 ENCOUNTER — Other Ambulatory Visit: Payer: Self-pay | Admitting: Neurology

## 2013-02-08 DIAGNOSIS — G35 Multiple sclerosis: Secondary | ICD-10-CM

## 2013-04-26 ENCOUNTER — Ambulatory Visit (INDEPENDENT_AMBULATORY_CARE_PROVIDER_SITE_OTHER): Payer: Self-pay | Admitting: Neurology

## 2013-04-26 ENCOUNTER — Ambulatory Visit (INDEPENDENT_AMBULATORY_CARE_PROVIDER_SITE_OTHER): Payer: Self-pay

## 2013-04-26 VITALS — BP 128/74 | HR 82 | Temp 98.8°F | Resp 19 | Wt 192.6 lb

## 2013-04-26 DIAGNOSIS — R32 Unspecified urinary incontinence: Secondary | ICD-10-CM

## 2013-04-26 DIAGNOSIS — G35 Multiple sclerosis: Secondary | ICD-10-CM

## 2013-04-26 DIAGNOSIS — D509 Iron deficiency anemia, unspecified: Secondary | ICD-10-CM

## 2013-04-26 MED ORDER — GADOPENTETATE DIMEGLUMINE 469.01 MG/ML IV SOLN: 20.0000 mL | Freq: Once | INTRAVENOUS | Status: AC | PRN

## 2013-04-26 NOTE — Progress Notes (Signed)
Patient here for his Visit 5 month 6 in PREFER MS study. Pt is randomized to Fingolimod and tolerating study medication. Patient had MRI done today as well. Next study visit appointments made and given to pt. Labs, Pros, MSQLI and SDMT was completed as per protocol. Dr.Yan in to complete ambulation, bradycardia assessed, neuro and physical exam. Patient was give FYI medication guide. Study drug bottles was returned with 12 pills and new study drug bottles was dispensed to patient.

## 2013-04-26 NOTE — Progress Notes (Signed)
31 RH AAF with RRMS. In PREFER trial, was randomized to Copaxone April 3rd, 2014, now cross over to [place] today June 19th 2014.  History: She was diagnosed with MS in 2002, presented with right optic neuritis, was treated at [facility], with steroid, later betaseron. She did very well, but had still born at week 22 while using betaseron before and during pregnancy,  betaseron was on hold during her later preganancies, 1st born 07/2003, second born August 2006,  third born April 2009 twins ( one of the twin girl died later).     She had major flare up in summer 2007, presented with four limbs paresthesia, weakness, difficulty walking, was put on steroid and betaseron transiently.  She described flu like illness, nause, vomitting, was on hold again. It was too expensive for her.  3rd major flare up was in Jan 2011, she has signficant weakness, four limb paresthesia, could not walk, drive for 2 months. Silvestre Moment was discussed with her but she is hesitate because worry about side effect of PML.  Feb 2014, she has right eye pain, blurry vsion and right arm/leg weakness, increased gait difficulty, also constipation, bladder incontinence, she is now better with steroid.  I have reviewed films at [facility]  MRI brain Bilateral periventricular and subcortical white matter lesions are compatible with the given diagnosis of multiple sclerosis.  No restricted diffusion or enhancement to suggest acute demyelination.     MRI cervical Areas of increased T2 hyperintensity in the spinal cord without expansion at C2-3 and C5 are compatible with demyelinating lesions.  A left-sided lesion is present at C7 measuring 15 mm.  No significant disc disease or stenosis.  MRI thoracic:  Diffuse cord signal abnormality T1-T9 is compatible with the given diagnosis of multiple sclerosis.  Focal enhancement along the ventral surface of the cord at T5-6 extends cephalocaudad for 12 mm.   Labs showed mild anemia Hg 11.5, normal BMP  Her  questions about copaxone were answered.    UPDATE Sept 18th 2014: Her gait difficulty has much improved, she is tolerating Gilenya without any difficulty, however there is a change of her insurance, she could not afford Depakote, baclofen anymore, Depakote has been very helpful for her headaches, she stopped the medication for 2 weeks, her headaches have come back, she has constant right parietal area pressure sensation, while she was taking Depakote, she has no significant headaches.   Physical Exam  General: normal. Head: normal cephalic Ears, Nose and Throat: normal. Neck: supple no carotid bruits Respiratory: clear to auscultation bilaterally Cardiovascular: regular rate rhythm Musculoskeletal: no deformity Skin: normal. Trunk: normal.  Neurologic Exam  Mental Status: pleasant, awake, alert, cooperative to history, talking, and casual conversation. Cranial Nerves: CN II-XII pupils were equal round reactive to light.  Fundi were sharp bilaterally.  Extraocular movements were full.  Visual fields were full on confrontational test.  Facial sensation and strength were normal.  Hearing was intact to finger rubbing bilaterally.  Uvula tongue were midline.  Head turning and shoulder shrugging were normal and symmetric.  Tongue protrusion into the cheeks strength were normal.  Motor: Bilateral upper and lower extremity motor strength was normal, only mild bilateral lower extremity spasticity, Sensory: length dependent decreased light touch, pinprick to above midcalf level, absent toe proprioception, and absent toe vibratory sensation. Coordination: Normal finger-to-nose, heel-to-shin.  There was no dysmetria noticed. Positive romberg signs Gait and Station: narrow based, mild unsteady, able to walk on tiptoe, heels, mild tandem difficulty Reflexes: Deep tendon reflexes:  Biceps: 1/1, Brachioradialis: 1/1, Triceps: 2/2, Pateller: 3/3, Achilles: 2/2.  Plantar responses are extensor  bilaterally.  Assessment and plan: 32 years old [place] female, with past medical history of relapsing remitting multiple sclerosis, was randomized to Copaxone since April 3rd 2014, now cross over to [place] since June 19th 2014..  1.  Continue [place]. 2.  Depakote Qhs as headache prevention, samples were given, also provide information for medical assistant program for Depakote and Topamax.

## 2013-04-27 ENCOUNTER — Encounter: Payer: Self-pay | Admitting: Neurology

## 2013-04-27 NOTE — Progress Notes (Signed)
Quick Note:  Please call patient, MRI brain showed findings consistent with multiple sclerosis, no change compared to previous study in Feb 2014. ______

## 2013-07-19 ENCOUNTER — Ambulatory Visit (INDEPENDENT_AMBULATORY_CARE_PROVIDER_SITE_OTHER): Payer: Self-pay | Admitting: Neurology

## 2013-07-19 ENCOUNTER — Encounter (INDEPENDENT_AMBULATORY_CARE_PROVIDER_SITE_OTHER): Payer: Self-pay

## 2013-07-19 VITALS — BP 135/82 | HR 83 | Temp 98.3°F | Resp 20 | Wt 189.0 lb

## 2013-07-19 DIAGNOSIS — G35 Multiple sclerosis: Secondary | ICD-10-CM

## 2013-07-19 NOTE — Progress Notes (Signed)
Patient here for her Visit 6 month 9 in PREFER MS study.  Pt crossedover to Fingolimod at 01/25/13 and tolerating study medication. Pt has had no con med or adverse events since last visit and has no new, but worsening MS symptoms today. Next study visit appointments made and given to pt. Labs and PRO was completed as per protocol. Dr.Yan in to complete bradycardia, ambulation assessment. neuro and physical exam. Patient continue to meet all inclusion and exclusion criteria to continue in study. Patient was give FYI medication guide. Study drug bottles was returned with 26 pills and new study drug bottles was dispensed to patient. MRI was also scheduled.

## 2013-07-19 NOTE — Progress Notes (Signed)
32 RH AAF with RRMS. In PREFER trial, was randomized to Copaxone April 3rd, 2014, now cross over to [place] today June 19th 2014.  History: She was diagnosed with MS in 2002, presented with right optic neuritis, was treated at [facility], with steroid, later betaseron. She did very well, but had still born at week 22 while using betaseron before and during pregnancy,  betaseron was on hold during her later preganancies, 1st born 07/2003, second born August 2006,  third born April 2009 twins ( one of the twin girl died later).     She had major flare up in summer 2007, presented with four limbs paresthesia, weakness, difficulty walking, was put on steroid and betaseron transiently.  She described flu like illness, nause, vomitting, was on hold again. It was too expensive for her.  3rd major flare up was in Jan 2011, she has signficant weakness, four limb paresthesia, could not walk, drive for 2 months. Silvestre Moment was discussed with her but she is hesitate because worry about side effect of PML.  Feb 2014, she has right eye pain, blurry vsion and right arm/leg weakness, increased gait difficulty, also constipation, bladder incontinence, she is now better with steroid.  I have reviewed films at [facility].  MRI brain Bilateral periventricular and subcortical white matter lesions are compatible with the given diagnosis of multiple sclerosis.  No restricted diffusion or enhancement to suggest acute demyelination.     MRI cervical Areas of increased T2 hyperintensity in the spinal cord without expansion at C2-3 and C5 are compatible with demyelinating lesions.  A left-sided lesion is present at C7 measuring 15 mm.  No significant disc disease or stenosis.  MRI thoracic:  Diffuse cord signal abnormality T1-T9 is compatible with the given diagnosis of multiple sclerosis.  Focal enhancement along the ventral surface of the cord at T5-6 extends cephalocaudad for 12 mm.   Labs showed mild anemia Hg 11.5, normal BMP  Her  questions about copaxone were answered.    UPDATE 12/11 2014: Her gait difficulty has much improved, she is tolerating Gilenya without any difficulty, She is now back to Depakote and Baclofen.  She denies significant gait difficulty, only has mild bilateral feet paresthesia.  Physical Exam  General: normal. Head: normal cephalic Ears, Nose and Throat: normal. Neck: supple no carotid bruits Respiratory: clear to auscultation bilaterally Cardiovascular: regular rate rhythm Musculoskeletal: no deformity Skin: normal. Trunk: normal.  Neurologic Exam  Mental Status: pleasant, awake, alert, cooperative to history, talking, and casual conversation. Cranial Nerves: CN II-XII pupils were equal round reactive to light.  Fundi were sharp bilaterally.  Extraocular movements were full.  Visual fields were full on confrontational test.  Facial sensation and strength were normal.  Hearing was intact to finger rubbing bilaterally.  Uvula tongue were midline.  Head turning and shoulder shrugging were normal and symmetric.  Tongue protrusion into the cheeks strength were normal.  Motor: Bilateral upper and lower extremity motor strength was normal, only mild bilateral lower extremity spasticity, Sensory: length dependent decreased light touch, pinprick to above midcalf level, absent toe proprioception, and absent toe vibratory sensation. Coordination: Normal finger-to-nose, heel-to-shin.  There was no dysmetria noticed. Positive romberg signs Gait and Station: narrow based, steady, able to walk on tiptoe, heels, mild tandem difficulty Reflexes: Deep tendon reflexes: Biceps: 1/1, Brachioradialis: 1/1, Triceps: 2/2, Pateller: 2/ 2, Achilles: 2/2.  Plantar responses are extensor bilaterally.  Assessment and plan:  32 years old [place] female, with past medical history of relapsing remitting multiple  sclerosis, was randomized to Copaxone since April 3rd 2014, now cross over to [place] since June 19th  2014. Doing very well, much improved gait difficulty  1.  Continue [place]ya. 2.  continue followup as scheduled

## 2013-10-04 ENCOUNTER — Encounter: Payer: Self-pay | Admitting: Neurology

## 2013-10-04 ENCOUNTER — Other Ambulatory Visit: Payer: Self-pay

## 2013-10-11 ENCOUNTER — Ambulatory Visit (INDEPENDENT_AMBULATORY_CARE_PROVIDER_SITE_OTHER): Payer: No Typology Code available for payment source

## 2013-10-11 ENCOUNTER — Ambulatory Visit (INDEPENDENT_AMBULATORY_CARE_PROVIDER_SITE_OTHER): Payer: No Typology Code available for payment source | Admitting: Neurology

## 2013-10-11 DIAGNOSIS — G35 Multiple sclerosis: Secondary | ICD-10-CM

## 2013-10-11 DIAGNOSIS — R32 Unspecified urinary incontinence: Secondary | ICD-10-CM

## 2013-10-11 MED ORDER — GADOPENTETATE DIMEGLUMINE 469.01 MG/ML IV SOLN: 18.0000 mL | Freq: Once | INTRAVENOUS | Status: AC | PRN

## 2013-10-11 NOTE — Progress Notes (Signed)
20 RH AAF with RRMS. In PREFER trial, was randomized to Copaxone April 3rd, 2014, now cross over to [place] today June 19th 2014.  History: She was diagnosed with MS in 2002, presented with right optic neuritis, was treated at [facility], with steroid, later betaseron. She did very well, but had still born at week 14 while using betaseron before and during pregnancy,  betaseron was on hold during her later 68, 1st born 07/2003, second born August 2006,  third born April 2009 twins ( one of the twin girl died later).     She had major flare up in summer 2007, presented with four limbs paresthesia, weakness, difficulty walking, was put on steroid and betaseron transiently.  She described flu like illness, nause, vomitting, was on hold again. It was too expensive for her.  3rd major flare up was in Jan 2011, she has signficant weakness, four limb paresthesia, could not walk, drive for 2 months. Dwyane Dee was discussed with her but she is hesitate because worry about side effect of PML.  Feb 2014, she has right eye pain, blurry vsion and right arm/leg weakness, increased gait difficulty, also constipation, bladder incontinence, she is now better with steroid.  I have reviewed films at [facility].  MRI brain Bilateral periventricular and subcortical white matter lesions are compatible with the given diagnosis of multiple sclerosis.  No restricted diffusion or enhancement to suggest acute demyelination.     MRI cervical Areas of increased T2 hyperintensity in the spinal cord without expansion at C2-3 and C5 are compatible with demyelinating lesions.  A left-sided lesion is present at C7 measuring 15 mm.  No significant disc disease or stenosis.  MRI thoracic:  Diffuse cord signal abnormality T1-T9 is compatible with the given diagnosis of multiple sclerosis.  Focal enhancement along the ventral surface of the cord at T5-6 extends cephalocaudad for 12 mm.   Labs showed mild anemia Hg 11.5, normal BMP  Her  questions about copaxone were answered.    UPDATE March 5th 2015: PREFER EXIT VISIT.   Her gait difficulty has much improved, she is tolerating Gilenya without any difficulty,  She has mild gait difficulty, but has much improved, she only has mild bilateral feet paresthesia, bilateral lower extremity spasticity,   Physical Exam  General: normal. Head: normal cephalic Ears, Nose and Throat: normal. Neck: supple no carotid bruits Respiratory: clear to auscultation bilaterally Cardiovascular: regular rate rhythm Musculoskeletal: no deformity Skin: normal. Trunk: normal.  Neurologic Exam  Mental Status: pleasant, awake, alert, cooperative to history, talking, and casual conversation. Cranial Nerves: CN II-XII pupils were equal round reactive to light.  Fundi were sharp bilaterally.  Extraocular movements were full.  Visual fields were full on confrontational test.  Facial sensation and strength were normal.  Hearing was intact to finger rubbing bilaterally.  Uvula tongue were midline.  Head turning and shoulder shrugging were normal and symmetric.  Tongue protrusion into the cheeks strength were normal.  Motor: Bilateral upper and lower extremity motor strength was normal, only mild bilateral lower extremity spasticity, Sensory: length dependent decreased light touch, pinprick to above midcalf level, absent toe proprioception, and absent toe vibratory sensation. Coordination: Normal finger-to-nose, heel-to-shin.  There was no dysmetria noticed. Positive romberg signs Gait and Station: narrow based, steady, able to walk on tiptoe, heels, mild tandem difficulty Reflexes: Deep tendon reflexes: Biceps: 1/1, Brachioradialis: 1/1, Triceps: 2/2, Pateller: 2/ 2, Achilles: 2/2.  Plantar responses are extensor bilaterally.  Assessment and plan:  33 years old [place] American female,  with past medical history of relapsing remitting multiple sclerosis, was randomized to Copaxone since April 3rd 2014, was  cross over to [place] since June 19th 2014. Doing very well, much improved gait difficulty, mild lower extremity spasticity.  She is to continue [place], RTC in 6 months

## 2013-10-12 ENCOUNTER — Telehealth: Payer: Self-pay | Admitting: Neurology

## 2013-10-12 NOTE — Telephone Encounter (Signed)
Sarah [name], please call patient, MRI result,continuted evidence of MS,  Keep current treatment and follow up plan.  Abnormal MRI scan of the bran showing multiple  periventricular, periatrial and subcortical white matter hyperintensities  compatible with multiple sclerosis. A solitary enhancing left occipital  horn periatrial lesion is noted indicating active inflammation. Overall  slight progression of disease activity compared with previous MRI scans  showing dated 09/09/2012

## 2013-10-15 NOTE — Telephone Encounter (Signed)
Left message with MRI brain results showing continued evidence of MS, to keep current treatment and FU plan, per Dr. Krista Blue.  Told to call with any questions.

## 2013-10-26 ENCOUNTER — Telehealth: Payer: Self-pay

## 2013-10-26 NOTE — Telephone Encounter (Signed)
[employer] sent [place] a letter stating they have approved our request for coverage on Gilenya effective until 04/27/2014 Ref # [id number]

## 2013-12-04 ENCOUNTER — Telehealth: Payer: No Typology Code available for payment source | Admitting: Neurology

## 2013-12-04 DIAGNOSIS — G35 Multiple sclerosis: Secondary | ICD-10-CM

## 2013-12-04 DIAGNOSIS — D509 Iron deficiency anemia, unspecified: Secondary | ICD-10-CM

## 2013-12-04 DIAGNOSIS — R32 Unspecified urinary incontinence: Secondary | ICD-10-CM

## 2013-12-04 NOTE — Telephone Encounter (Signed)
Patient called to state that she believes she is having a relapse, complains of decreased stamina over the past 2 weeks, blurred vision, and the feeling like she's getting a cold. Patient states she believes she is overmedicated. Please call and advise patient.

## 2013-12-04 NOTE — Telephone Encounter (Signed)
Pt calling stating that she is having decreased stamina over the past 2 wks, blurred vision and feeling like she is getting a cold. Pt states that she is over medicated. Please advise

## 2013-12-05 NOTE — Telephone Encounter (Signed)
Patient calling back--no returned call yet--would like prednisone called in because symptoms getting worse--[facility] [address]--please call.

## 2013-12-06 ENCOUNTER — Telehealth (HOSPITAL_COMMUNITY): Payer: Self-pay | Admitting: *Deleted

## 2013-12-06 NOTE — Telephone Encounter (Signed)
Patient called and reported having an MS flare. Stated that she would like some medication to help with the symptoms.

## 2013-12-06 NOTE — Telephone Encounter (Signed)
I called could not reach her, [name] please call her for more detail

## 2013-12-06 NOTE — Telephone Encounter (Signed)
Spoke to patient and she has had symptoms of legs feeling heavy, balance is off, and vision blurred with no peripheral vision.  This has been going on for 2 weeks and has gotten worse in the last week.  She can be reached on cell at [phone].

## 2013-12-07 ENCOUNTER — Encounter: Payer: Self-pay | Admitting: Neurology

## 2013-12-07 ENCOUNTER — Encounter (HOSPITAL_COMMUNITY): Payer: Self-pay | Admitting: Emergency Medicine

## 2013-12-07 ENCOUNTER — Inpatient Hospital Stay (HOSPITAL_COMMUNITY)
Admission: EM | Admit: 2013-12-07 | Discharge: 2013-12-10 | DRG: 060 | Disposition: A | Discharge status: Home / Self Care | Payer: No Typology Code available for payment source | Attending: Internal Medicine | Admitting: Internal Medicine

## 2013-12-07 ENCOUNTER — Other Ambulatory Visit: Payer: Self-pay | Admitting: Neurology

## 2013-12-07 ENCOUNTER — Emergency Department (HOSPITAL_COMMUNITY): Payer: No Typology Code available for payment source

## 2013-12-07 ENCOUNTER — Ambulatory Visit (INDEPENDENT_AMBULATORY_CARE_PROVIDER_SITE_OTHER): Payer: No Typology Code available for payment source | Admitting: Neurology

## 2013-12-07 ENCOUNTER — Other Ambulatory Visit (INDEPENDENT_AMBULATORY_CARE_PROVIDER_SITE_OTHER): Payer: Self-pay

## 2013-12-07 DIAGNOSIS — Z79899 Other long term (current) drug therapy: Secondary | ICD-10-CM

## 2013-12-07 DIAGNOSIS — G35 Multiple sclerosis: Principal | ICD-10-CM | POA: Diagnosis present

## 2013-12-07 DIAGNOSIS — D509 Iron deficiency anemia, unspecified: Secondary | ICD-10-CM

## 2013-12-07 DIAGNOSIS — Z0289 Encounter for other administrative examinations: Secondary | ICD-10-CM

## 2013-12-07 DIAGNOSIS — Z6831 Body mass index (BMI) 31.0-31.9, adult: Secondary | ICD-10-CM

## 2013-12-07 DIAGNOSIS — B373 Candidiasis of vulva and vagina: Secondary | ICD-10-CM | POA: Diagnosis not present

## 2013-12-07 DIAGNOSIS — R32 Unspecified urinary incontinence: Secondary | ICD-10-CM

## 2013-12-07 DIAGNOSIS — E669 Obesity, unspecified: Secondary | ICD-10-CM | POA: Diagnosis present

## 2013-12-07 DIAGNOSIS — G35D Multiple sclerosis, unspecified: Secondary | ICD-10-CM

## 2013-12-07 DIAGNOSIS — B3731 Acute candidiasis of vulva and vagina: Secondary | ICD-10-CM | POA: Diagnosis not present

## 2013-12-07 DIAGNOSIS — Z85820 Personal history of malignant melanoma of skin: Secondary | ICD-10-CM

## 2013-12-07 LAB — CBC
HEMATOCRIT: 34.3 % — AB (ref 36.0–46.0)
Hemoglobin: 11.4 g/dL — ABNORMAL LOW (ref 12.0–15.0)
MCH: 30.5 pg (ref 26.0–34.0)
MCHC: 33.2 g/dL (ref 30.0–36.0)
MCV: 91.7 fL (ref 78.0–100.0)
Platelets: 288 10*3/uL (ref 150–400)
RBC: 3.74 MIL/uL — ABNORMAL LOW (ref 3.87–5.11)
RDW: 12.5 % (ref 11.5–15.5)
WBC: 7.7 10*3/uL (ref 4.0–10.5)

## 2013-12-07 LAB — BASIC METABOLIC PANEL
BUN: 10 mg/dL (ref 6–23)
CALCIUM: 8.9 mg/dL (ref 8.4–10.5)
CO2: 29 mEq/L (ref 19–32)
Chloride: 101 mEq/L (ref 96–112)
Creatinine, Ser: 0.63 mg/dL (ref 0.50–1.10)
Glucose, Bld: 90 mg/dL (ref 70–99)
POTASSIUM: 4 meq/L (ref 3.7–5.3)
Sodium: 141 mEq/L (ref 137–147)

## 2013-12-07 MED ORDER — SODIUM CHLORIDE 0.9 % IV BOLUS (SEPSIS)
1000.0000 mL | Freq: Once | INTRAVENOUS | Status: AC
  Administered 2013-12-07: 1000 mL via INTRAVENOUS

## 2013-12-07 MED ORDER — GADOBENATE DIMEGLUMINE 529 MG/ML IV SOLN
20.0000 mL | Freq: Once | INTRAVENOUS | Status: AC | PRN
  Administered 2013-12-07: 20 mL via INTRAVENOUS

## 2013-12-07 MED ORDER — METHYLPREDNISOLONE 4 MG PO KIT: PACK | ORAL | Status: DC

## 2013-12-07 NOTE — Consult Note (Signed)
Neurology Consultation Reason for Consult: Visual changes Referring Physician: Stark Jock, D.  CC: Visual changes  History is obtained from: Patient, medical record  HPI: Sarah [name] is a 33 y.o. female who presented to her neurologist Dr. Krista Blue earlier today with worsening gait difficulty, weakness, numbness as well as vision changes. The bilateral nature of her visual changes was concerning to Dr. Krista Blue and therefore she is referred her to the ER for further evaluation.  Due to the unusual nature of the symptoms, pres or PML were felt to be considerations in addition to possible MS flare and therefore urgent imaging was required prior to treatment.  She states has had worsening gait difficulty over the past week or 2. She also has noticed worsening of her right side weakness and numbness as well as worsening of her vision bilaterally.  Of note her first MS flare that prompted her diagnosis consisted of bilateral optic neuritis. She has [name] located in the back of each eye worse with eye movement.   ROS: A 14 point ROS was performed and is negative except as noted in the HPI.  Past Medical History  Diagnosis Date  . Multiple sclerosis     Family History: Mother-diabetes  Social History: Tob: Never smoker  Exam: Current vital signs: BP 129/79  Pulse 72  Temp(Src) 97.9 F (36.6 C) (Oral)  Resp 20  Ht 5\' 5"  (1.651 m)  Wt 86.818 kg (191 lb 6.4 oz)  BMI 31.85 kg/m2  SpO2 100%  LMP 11/26/2013 Vital signs in last 24 hours: Temp:  [97.9 F (36.6 C)] 97.9 F (36.6 C) (05/01 1736) Pulse Rate:  [72-79] 72 (05/01 1930) Resp:  [20] 20 (05/01 1736) BP: (129-153)/(79-94) 129/79 mmHg (05/01 1930) SpO2:  [100 %] 100 % (05/01 1930) Weight:  [86.818 kg (191 lb 6.4 oz)] 86.818 kg (191 lb 6.4 oz) (05/01 1736)  General: in bed, NAD CV: RRR Mental Status: Patient is awake, alert, oriented to person, place, month, year, and situation. Immediate and remote memory are intact. Patient is able  to give a clear and coherent history. No signs of aphasia or neglect Cranial Nerves: II: Visual Fields are notable for markedly decreased peripheral vision bilaterally, she is able to finger counr midline in boht eyes. Pupils are equal, round, and reactive to light.  Fundi without obvious papilledema.. III,IV, VI: EOMI without ptosis or diploplia.  V: Facial sensation is decreased on the right. VII: Facial movement is symmetric.  VIII: hearing is intact to voice X: Uvula elevates symmetrically XI: Shoulder shrug is symmetric. XII: tongue is midline without atrophy or fasciculations.  Motor: Tone is normal. Bulk is normal. 5/5 strength was present in left arm, 4/5 weakness throughout right side 5-/5 weakness in left leg Sensory: Sensation is decreased to temperature throughout right side Deep Tendon Reflexes: 2+ and symmetric in the biceps and patellae.  Cerebellar: Mild difficulty with finger-nose-finger on the left. Consistent with weakness on right. Gait: Not tested secondary to patient safety concerns.  I have reviewed labs in epic and the results pertinent to this consultation are: BMP, CBC-unremarkable  I have reviewed the images obtained: MRI brain-multiple areas of active demyelination  Impression: 33 year old female with worsening gait and right-sided weakness secondary to demyelinating plaques. The bilateral loss of vision is more unusual, but she does have a history of bilateral optic neuritis in the past. With [name] in the back of the eyes associated with progressive loss of vision, I think that optic neuritis is certainly a strong possibility.  fingolimod can cause macular edema, but this is typically within the first 4 months of starting the medication. Given the possibility, however, I would favor holding a few doses until she is able to have followup with her ophthalmologist.  Recommendations: 1) hold fingolimod for now 2) Solu-Medrol 500 mg twice a day, if this can be  arranged outpatient then it could be consolidated to 1 g daily 3) famotidine for GI prophylaxis while on solu-Medrol  Roland Rack, MD [facility] [phone]  If 7pm- 7am, please page neurology on call as listed in [place].

## 2013-12-07 NOTE — ED Notes (Signed)
Returned from MRI; Neurology aware that pt. Is in room.

## 2013-12-07 NOTE — Telephone Encounter (Signed)
Patient returning call-please call patient @ [phone]--thank you.

## 2013-12-07 NOTE — Telephone Encounter (Signed)
Patient thinks she is having a MS relapse. Called patient to see how she was doing and patients voice mail was full was unable to leave her a message.  Patient talked to [employer].

## 2013-12-07 NOTE — ED Notes (Signed)
She states she is having blurry vision and off balance, "feels like when my MS flares up." she is A&Ox4, breathing easily

## 2013-12-07 NOTE — ED Notes (Signed)
Attempted piv; difficult stick; veins deep; paged iv team.

## 2013-12-07 NOTE — Telephone Encounter (Signed)
I have called her, She complains of two weeks history of declining, progressively worse, worsening gait difficulty, blurry vision, 3 weeks ago, around April 11th 2015, she was treated with antibiotics for 10 day because of open wound, then she stopped gilenya for one week, now restarted it back.   She denies fever,   1. Labs today, cbc UA, cmp,  2. Call in medrol pak 3. MRI brain and cervical w/wo.

## 2013-12-07 NOTE — ED Notes (Signed)
The patient is unable to give an urine specimen at this time. The patient has been advised to use call light for assistance to the restroom. The tech has reported to the RN in charge. 

## 2013-12-07 NOTE — ED Provider Notes (Signed)
CSN: [id number]     Arrival date & time 12/07/13  1725 History   First MD Initiated Contact with Patient 12/07/13 1855     Chief Complaint  Patient presents with  . Multiple Sclerosis     (Consider location/radiation/quality/duration/timing/severity/associated sxs/prior Treatment) HPI Comments: Patient is a 32 year old female with history of multiple sclerosis followed by Dr. Krista Blue. She was referred here for further workup of visual disturbances and balance problems. She states this is what it feels like when her multiple sclerosis flares up. She was seen by Dr. Krista Blue today who wants her to have laboratory studies, MRIs, and neurology consultation. She denies to me she is having any fevers, chills, chest pain.  Patient is a 33 y.o. female presenting with weakness. The history is provided by the patient.  Weakness This is a new problem. The current episode started 2 days ago. The problem occurs constantly. The problem has been gradually worsening. Pertinent negatives include no chest pain and no shortness of breath. Nothing aggravates the symptoms. Nothing relieves the symptoms. She has tried nothing for the symptoms. The treatment provided no relief.    Past Medical History  Diagnosis Date  . Multiple sclerosis    Past Surgical History  Procedure Laterality Date  . Cesarean section    . Melanoma excision  age 34    on head   Family History  Problem Relation Age of Onset  . Diabetes Mother    History  Substance Use Topics  . Smoking status: Never Smoker   . Smokeless tobacco: Not on file  . Alcohol Use: Yes   OB History   Grav Para Term Preterm Abortions TAB SAB Ect Mult Living                 Review of Systems  Respiratory: Negative for shortness of breath.   Cardiovascular: Negative for chest pain.  Neurological: Positive for weakness.  All other systems reviewed and are negative.     Allergies  Review of patient's allergies indicates no known allergies.  Home  Medications   Prior to Admission medications   Medication Sig Start Date End Date Taking? Authorizing Provider  baclofen (LIORESAL) 10 MG tablet Take 0.5 tablets (5 mg total) by mouth 2 (two) times daily. 01/25/13  Yes Marcial Pacas, MD  Fingolimod HCl (GILENYA) 0.5 MG CAPS Take 0.5 mg by mouth daily.   Yes Historical Provider, MD  ibuprofen (ADVIL,MOTRIN) 800 MG tablet Take 800 mg by mouth every 8 (eight) hours as needed for moderate pain.   Yes Historical Provider, MD   BP 129/79  Pulse 72  Temp(Src) 97.9 F (36.6 C) (Oral)  Resp 20  Ht 5\' 5"  (1.651 m)  Wt 191 lb 6.4 oz (86.818 kg)  BMI 31.85 kg/m2  SpO2 100%  LMP 11/26/2013 Physical Exam  Nursing note and vitals reviewed. Constitutional: She is oriented to person, place, and time. She appears well-developed and well-nourished. No distress.  HENT:  Head: Normocephalic and atraumatic.  Eyes: EOM are normal. Pupils are equal, round, and reactive to light.  Neck: Normal range of motion. Neck supple.  Cardiovascular: Normal rate and regular rhythm.  Exam reveals no gallop and no friction rub.   No murmur heard. Pulmonary/Chest: Effort normal and breath sounds normal. No respiratory distress. She has no wheezes.  Abdominal: Soft. Bowel sounds are normal. She exhibits no distension. There is no tenderness.  Musculoskeletal: Normal range of motion.  Neurological: She is alert and oriented to person, place, and time.  No cranial nerve deficit. She exhibits normal muscle tone. Coordination normal.  Skin: Skin is warm and dry. She is not diaphoretic.    ED Course  Procedures (including critical care time) Labs Review Labs Reviewed  CBC - Abnormal; Notable for the following:    RBC 3.74 (*)    Hemoglobin 11.4 (*)    HCT 34.3 (*)    All other components within normal limits  BASIC METABOLIC PANEL  URINALYSIS, ROUTINE W REFLEX MICROSCOPIC    Imaging Review No results found.   EKG Interpretation None      MDM   Final diagnoses:   None    Patient is a 33 year old female sent from the neurologist's office for treatment and workup of a potential MS flare up. Workup reveals lesions consistent with this. The patient has been evaluated by Dr. Leonel Ramsay who would like her admitted for IV steroids. I've spoken with Dr. Arnoldo Morale from [facility] who agrees to admit.    Veryl Speak, MD 12/08/13 0030

## 2013-12-07 NOTE — Progress Notes (Addendum)
33 RH AAF with RRMS. In PREFER trial, was randomized to Copaxone April 3rd, 2014, was cross over to [place] today June 19th 2014.  History: She was diagnosed with MS in 2002, presented with right optic neuritis, was treated at [facility], with steroid, later betaseron. She did very well, but had still born at week 22, while using betaseron before and during pregnancy,  betaseron was on hold during her later 42, 1st born 07/2003, second born August 2006,  third born April 2009 twins ( one of the twin girl died later).     She had major flare up in summer 2007, presented with four limbs paresthesia, weakness, difficulty walking, was put on steroid and betaseron transiently.  She described flu like illness, nause, vomitting, was on hold again. It was too expensive for her.  3rd major flare up was in Jan 2011, she has signficant weakness, four limb paresthesia, could not walk, drive for 2 months. [place] was discussed with her, but she is hesitate because worry about side effect of PML.  Feb 2014, she has right eye pain, blurry vsion and right arm/leg weakness, increased gait difficulty, also constipation, bladder incontinence, she is now better with steroid.  I have reviewed films at [facility].  MRI brain Bilateral periventricular and subcortical white matter lesions are compatible with the given diagnosis of multiple sclerosis.  No restricted diffusion or enhancement to suggest acute demyelination.     MRI cervical Areas of increased T2 hyperintensity in the spinal cord without expansion at C2-3 and C5 are compatible with demyelinating lesions.  A left-sided lesion is present at C7 measuring 15 mm.  No significant disc disease or stenosis.  MRI thoracic:  Diffuse cord signal abnormality T1-T9 is compatible with the given diagnosis of multiple sclerosis.  Focal enhancement along the ventral surface of the cord at T5-6 extends cephalocaudad for 12 mm.   Labs showed mild anemia Hg 11.5, normal BMP  Her  questions about copaxone were answered.    JC virus antibody was negative with titer of 0.19 in May 5th 2015:  UPDATE March 5th 2015: PREFER EXIT VISIT.   Her gait difficulty has much improved, she is tolerating Gilenya without any difficulty,  She has mild gait difficulty, but has much improved, she only has mild bilateral feet paresthesia, bilateral lower extremity spasticity,   UPDATE May 1st 2015: This is urgent visit for patient, she developed low back skin infection 3 weeks ago, around November 17, 2013, was treated with antibiotics, during that period time, she complains of not feeling well, stop Gilenya by herself, for about 7-10 days, was restarted later, now her skin infection has healed, but she presenting with 2 weeks history of progressive worsening gait difficulty, right arm and leg weakness, numbness, the most striking abnormality is progressive worsening bilateral visual difficulty, severely constricted visual field, decreased visual acuity bilaterally. She drove to work daily despite her difficulties.  Physical Exam  General: normal. Head: normal cephalic Ears, Nose and Throat: normal. Neck: supple no carotid bruits Respiratory: clear to auscultation bilaterally Cardiovascular: regular rate rhythm Musculoskeletal: no deformity Skin: normal. Trunk: normal.  Neurologic Exam  Mental Status: distressed, cooperative to history, talking, and casual conversation. Cranial Nerves: CN II-XII pupils were equal round reactive to light.  Fundi were sharp bilaterally.  Extraocular movements were full.  Visual fields were severely restricted bilaterally, she could only finger counting with OD, read moderate print with OS,   Facial sensation and strength were normal.  Hearing was intact to  finger rubbing bilaterally.  Uvula tongue were midline.  Head turning and shoulder shrugging were normal and symmetric.  Tongue protrusion into the cheeks strength were normal.  Motor: she has mild right arm  proximal and distal weakness, mild to moderate bilateral lower extremity spasticity, hip flexion 4/4+, knee flexion 4+/5-, knee extension 4+/5-. Ankle dorsiflexion 4/5. Sensory: length dependent decreased light touch, pinprick to above midcalf level, absent toe proprioception, and absent toe vibratory sensation. Coordination: Normal finger-to-nose, heel-to-shin.  There was no dysmetria noticed. Positive romberg signs Gait and Station: wide based, unsteady, dragging her right leg Reflexes: Deep tendon reflexes: Biceps: 1/1, Brachioradialis: 1/1, Triceps: 2/2, Pateller: 2/2, Achilles: 2/2.  Plantar responses are extensor bilaterally.  Assessment and plan:  33 years old [place] American female American female, with past medical history of relapsing remitting multiple sclerosis, was randomized to Copaxone since April 3rd 2014, was cross over to [place] since June 19th 2014. There was significant improvement in her neurological symptoms rales, most recent visit was in March 2015, was doing well, repeat MRI of the brain with and without contrast then was stable, she had a sudden clinical worsening over past 2 weeks, the most striking abnormality is were severely decreased visual acuity, severely constricted bilateral visual field,  Possibility including MS flareup, also including Gilenya side effect, such as macular edema, even PML infection, because of her severely constricted bilateral visual field, indicating bilateral parietal, occipital lesions,  She needs MRI of the brain with and without contrast, MRI of the cervical without contrast,  She needs laboratory evaluations, including UA, CBC, to rule out active infectious process,  Ophthalmology consultation,  I have draw JC. antibody with titer at office today, but the result may take few days to come back.  I have talked with neurohospitalist Dr. Nicole Kindred, I will send her to ED for evaluation, her significant other's will drive her from clinic to the emergency room  now

## 2013-12-08 DIAGNOSIS — G35 Multiple sclerosis: Secondary | ICD-10-CM | POA: Diagnosis present

## 2013-12-08 DIAGNOSIS — G35D Multiple sclerosis, unspecified: Secondary | ICD-10-CM | POA: Diagnosis present

## 2013-12-08 DIAGNOSIS — R32 Unspecified urinary incontinence: Secondary | ICD-10-CM

## 2013-12-08 DIAGNOSIS — D509 Iron deficiency anemia, unspecified: Secondary | ICD-10-CM

## 2013-12-08 LAB — BASIC METABOLIC PANEL
BUN: 9 mg/dL (ref 6–23)
CHLORIDE: 105 meq/L (ref 96–112)
CO2: 24 meq/L (ref 19–32)
Calcium: 9.2 mg/dL (ref 8.4–10.5)
Creatinine, Ser: 0.58 mg/dL (ref 0.50–1.10)
GFR calc non Af Amer: >90 mL/min (ref >90)
Glucose, Bld: 113 mg/dL — ABNORMAL HIGH (ref 70–99)
Potassium: 4 mEq/L (ref 3.7–5.3)
Sodium: 141 mEq/L (ref 137–147)

## 2013-12-08 LAB — CBC
HCT: 36.6 % (ref 36.0–46.0)
HEMOGLOBIN: 12.3 g/dL (ref 12.0–15.0)
MCH: 30.4 pg (ref 26.0–34.0)
MCHC: 33.6 g/dL (ref 30.0–36.0)
MCV: 90.6 fL (ref 78.0–100.0)
Platelets: 307 10*3/uL (ref 150–400)
RBC: 4.04 MIL/uL (ref 3.87–5.11)
RDW: 12.3 % (ref 11.5–15.5)
WBC: 7.4 10*3/uL (ref 4.0–10.5)

## 2013-12-08 LAB — URINALYSIS, ROUTINE W REFLEX MICROSCOPIC
BILIRUBIN URINE: NEGATIVE
Glucose, UA: NEGATIVE mg/dL
KETONES UR: NEGATIVE mg/dL
Leukocytes, UA: NEGATIVE
NITRITE: NEGATIVE
PROTEIN: NEGATIVE mg/dL
SPECIFIC GRAVITY, URINE: 1.028 (ref 1.005–1.030)
UROBILINOGEN UA: 1 mg/dL (ref 0.0–1.0)
pH: 6.5 (ref 5.0–8.0)

## 2013-12-08 LAB — URINE MICROSCOPIC-ADD ON

## 2013-12-08 MED ORDER — SODIUM CHLORIDE 0.9 % IV SOLN
500.0000 mg | Freq: Two times a day (BID) | INTRAVENOUS | Status: DC
  Administered 2013-12-08 – 2013-12-10 (×6): 500 mg via INTRAVENOUS
  Filled 2013-12-08 (×7): qty 4

## 2013-12-08 MED ORDER — FAMOTIDINE 20 MG PO TABS
20.0000 mg | ORAL_TABLET | Freq: Two times a day (BID) | ORAL | Status: DC
  Administered 2013-12-08 – 2013-12-10 (×6): 20 mg via ORAL
  Filled 2013-12-08 (×7): qty 1

## 2013-12-08 MED ORDER — BACLOFEN 5 MG HALF TABLET
5.0000 mg | ORAL_TABLET | Freq: Two times a day (BID) | ORAL | Status: DC
  Administered 2013-12-08 – 2013-12-10 (×5): 5 mg via ORAL
  Filled 2013-12-08 (×6): qty 1

## 2013-12-08 MED ORDER — SODIUM CHLORIDE 0.9 % IJ SOLN: 3.0000 mL | INTRAMUSCULAR | Status: DC | PRN

## 2013-12-08 MED ORDER — ONDANSETRON HCL 4 MG PO TABS: 4.0000 mg | ORAL_TABLET | Freq: Four times a day (QID) | ORAL | Status: DC | PRN

## 2013-12-08 MED ORDER — IBUPROFEN 800 MG PO TABS
800.0000 mg | ORAL_TABLET | Freq: Three times a day (TID) | ORAL | Status: DC | PRN
  Filled 2013-12-08: qty 1

## 2013-12-08 MED ORDER — HYDROMORPHONE HCL PF 1 MG/ML IJ SOLN: 0.5000 mg | INTRAMUSCULAR | Status: DC | PRN

## 2013-12-08 MED ORDER — ACETAMINOPHEN 650 MG RE SUPP: 650.0000 mg | Freq: Four times a day (QID) | RECTAL | Status: DC | PRN

## 2013-12-08 MED ORDER — SODIUM CHLORIDE 0.9 % IJ SOLN
3.0000 mL | Freq: Two times a day (BID) | INTRAMUSCULAR | Status: DC
  Administered 2013-12-08 – 2013-12-09 (×4): 3 mL via INTRAVENOUS

## 2013-12-08 MED ORDER — ONDANSETRON HCL 4 MG/2ML IJ SOLN: 4.0000 mg | Freq: Four times a day (QID) | INTRAMUSCULAR | Status: DC | PRN

## 2013-12-08 MED ORDER — SODIUM CHLORIDE 0.9 % IV SOLN
250.0000 mL | INTRAVENOUS | Status: DC | PRN
  Administered 2013-12-08: 250 mL via INTRAVENOUS

## 2013-12-08 MED ORDER — CLOTRIMAZOLE 1 % VA CREA
1.0000 | TOPICAL_CREAM | Freq: Every day | VAGINAL | Status: DC
  Administered 2013-12-10: 1 via VAGINAL
  Filled 2013-12-08: qty 45

## 2013-12-08 MED ORDER — ALUM & MAG HYDROXIDE-SIMETH 200-200-20 MG/5ML PO SUSP: 30.0000 mL | Freq: Four times a day (QID) | ORAL | Status: DC | PRN

## 2013-12-08 MED ORDER — OXYCODONE HCL 5 MG PO TABS: 5.0000 mg | ORAL_TABLET | ORAL | Status: DC | PRN

## 2013-12-08 MED ORDER — ENOXAPARIN SODIUM 40 MG/0.4ML ~~LOC~~ SOLN
40.0000 mg | SUBCUTANEOUS | Status: DC
  Administered 2013-12-08 – 2013-12-10 (×3): 40 mg via SUBCUTANEOUS
  Filled 2013-12-08 (×3): qty 0.4

## 2013-12-08 MED ORDER — ACETAMINOPHEN 325 MG PO TABS
650.0000 mg | ORAL_TABLET | Freq: Four times a day (QID) | ORAL | Status: DC | PRN
  Administered 2013-12-08: 650 mg via ORAL
  Filled 2013-12-08: qty 2

## 2013-12-08 NOTE — Progress Notes (Signed)
[facility] PROGRESS NOTE  Sarah [name] NUU:[id number] DOB: 04-13-1981 DOA: 12/07/2013 PCP: No PCP Per Patient  Assessment/Plan: 1. MS Exacerbation - On High Dose IV Steroids, [place] Rx. Neuro following with recs for cont IV solumedrol 500mg  q12hrs for total of 6 doses. PT/OT consulted pre recs. 2. Anemia - Normal CBC this AM 3. DVT Prophylaxis with Lovenox.   Code Status: Full Family Communication: Pt in room (indicate person spoken with, relationship, and if by phone, the number) Disposition Plan: Pending   Consultants:  Neurology  HPI/Subjective: No major complaints. Overall feels unchanged overnight  Objective: Filed Vitals:   12/10/13 0000 Dec 10, 2013 0030 Dec 10, 2013 0156 Dec 10, 2013 0642  BP: 124/80 138/84 121/72 116/59  Pulse: 82 91 81 63  Temp:   97.6 F (36.4 C) 97.8 F (36.6 C)  TempSrc:   Oral Oral  Resp:   18 18  Height:      Weight:      SpO2: 100% 100% 99% 99%    Intake/Output Summary (Last 24 hours) at 12-10-2013 1040 Last data filed at 10-Dec-2013 0102  Gross per 24 hour  Intake      3 ml  Output      0 ml  Net      3 ml   Filed Weights   12/07/13 1736  Weight: 86.818 kg (191 lb 6.4 oz)    Exam:   General:  Awake, in nad  Cardiovascular: regular, s1, s2  Respiratory: normal resp effort, no wheezing  Abdomen: soft, nondistended  Musculoskeletal: perfused, no clubbing   Data Reviewed: Basic Metabolic Panel:  Recent Labs Lab 12/07/13 1735 12/10/2013 0538  NA 141 141  K 4.0 4.0  CL 101 105  CO2 29 24  GLUCOSE 90 113*  BUN 10 9  CREATININE 0.63 0.58  CALCIUM 8.9 9.2   Liver Function Tests: No results found for this basename: AST, ALT, ALKPHOS, BILITOT, PROT, ALBUMIN,  in the last 168 hours No results found for this basename: LIPASE, AMYLASE,  in the last 168 hours No results found for this basename: AMMONIA,  in the last 168 hours CBC:  Recent Labs Lab 12/07/13 1735 12-10-2013 0538  WBC 7.7 7.4  HGB 11.4* 12.3  HCT 34.3* 36.6   MCV 91.7 90.6  PLT 288 307   Cardiac Enzymes: No results found for this basename: CKTOTAL, CKMB, CKMBINDEX, TROPONINI,  in the last 168 hours BNP (last 3 results) No results found for this basename: PROBNP,  in the last 8760 hours CBG: No results found for this basename: GLUCAP,  in the last 168 hours  No results found for this or any previous visit (from the past 240 hour(s)).   Studies: Mr Kizzie Fantasia Contrast  10-Dec-2013   CLINICAL DATA:  Multiple sclerosis with blurred vision and gait abnormality.  EXAM: MRI HEAD AND ORBITS WITHOUT AND WITH CONTRAST  TECHNIQUE: Multiplanar, multiecho pulse sequences of the brain and surrounding structures were obtained without and with intravenous contrast. Multiplanar, multiecho pulse sequences of the orbits and surrounding structures were obtained including fat saturation techniques, before and after intravenous contrast administration.  CONTRAST:  34mL MULTIHANCE GADOBENATE DIMEGLUMINE 529 MG/ML IV SOLN  COMPARISON:  Prior MRI from 09/09/2012.  FINDINGS: MRI HEAD FINDINGS  Scattered periventricular and subcortical white matter lesions again seen involving the periventricular and deep white matter of both cerebral hemispheres. Several of these are oriented perpendicular to the lateral ventricles, and several callosum septal lesions are present. Findings are consistent with history of multiple sclerosis.  There is a new 5 mm white matter lesion within the subcortical white matter of the high the right frontal lobe (series 6, image 23). This lesion enhances on post-contrast sequences (series 16, image 46), compatible with active demyelination. Additional subtle 3 mm enhancing lesion seen within the deep white matter of the left frontal lobe (series 16, image 30). There is a 3 mm enhancing lesion within the deep white matter of the left parietal lobe (series 16, image 29). 3 mm enhancing lesion seen within the left splenium (series 16, image 33). There is an  incomplete ring of active demyelination within the medial left cerebellar hemisphere (series 16, image 16). Small 3 mm enhancing focus seen just inferiorly within the left middle cerebellar peduncle (series 16, image 16). An additional tiny 3 mm enhancing focus seen within the right cerebellar hemisphere (series 16, image 18). There is corresponding T2/FLAIR signal intensity within these lesions.  No mass lesion, midline shift, or hydrocephalus. There is no extra-axial fluid collection. No evidence of acute intracranial infarct or hemorrhage.  Cervicomedullary junction is normal. Pituitary gland within normal limits.  Calvarium demonstrates a normal appearance with normal signal intensity.  The paranasal sinuses are clear. Mild T2 hyperintense mucosal thickening present within the left maxillary sinus. Minimal opacity seen within the 4 of the right frontal sinus. There is partial opacification of the left ethmoidal air cells.  No mastoid effusion.  MRI ORBITS FINDINGS  Dedicated views of the globes and optic nerves demonstrate mild left optic nerve atrophy (series 12, image 6)a. No associated enhancement to suggest active inflammation. The left optic nerve is minimally fuzzy in appearance relative to the right without definite evidence of optic nerve atrophy. The optic chiasm is normal. The globes themselves are within normal limits. No abnormality seen within the intraconal or extraconal fat. The extraocular muscles are normal.  IMPRESSION: MRI BRAIN IMPRESSION:  1. Multiple enhancing foci involving the supratentorial white matter as detailed above, compatible with active demyelination related to multiple sclerosis flare. The most prominent of these lesions is located within the right frontal lobe and measures 4 mm. 2. Prominent focus of active demyelination within the left cerebellar hemisphere as above.  MRI ORBIT IMPRESSION:  1. Left optic nerve atrophy. No enhancement to suggest acute optic neuritis identified.    Electronically Signed   By: Jeannine Boga M.D.   On: 12/08/2013 00:01   Mr Cervical Spine W Wo Contrast  12/08/2013   CLINICAL DATA:  Multiple sclerosis, gait difficulties.  EXAM: MRI CERVICAL SPINE WITHOUT AND WITH CONTRAST  TECHNIQUE: Multiplanar and multiecho pulse sequences of the cervical spine, to include the craniocervical junction and cervicothoracic junction, were obtained according to standard protocol without and with intravenous contrast.  CONTRAST:  61mL MULTIHANCE GADOBENATE DIMEGLUMINE 529 MG/ML IV SOLN  COMPARISON:  Prior MRI from 09/09/2012.  FINDINGS: Abnormal enhancement within the left cerebellar hemisphere noted on pars to the viewed portions of the brain, better evaluated on concomitant brain MRI.  There is slight reversal of the normal cervical lordosis with apex at C4, unchanged. No listhesis. Signal intensity within the vertebral body bone marrow is normal. Vertebral body heights and intervertebral disc spaces are well preserved.  On sagittal STIR sequence, extensive white matter changes again seen within the cervical spinal cord, compatible with history of multiple sclerosis. A lesion within the dorsal cord at C2-3 measures approximately 1.4 cm in length. Additional lesion within the dorsal cord at C5 measures approximately 1.5 cm in length. Patchy signal intensity  within the right aspect of the cord at C3-4 is slightly increased in prominence relative to prior. Patchy hyperintense signal intensity within the cord extending distally from the T1-2 level grossly similar.  There is a 7 mm focus of enhancement within the left aspect of the cord at the level of C5, compatible with active demyelination (series 3, image 8). Additional tiny 2 mm focus of enhancement seen within the dorsal cord at the level of C6-7 (series 3, image 7). No other definite abnormal enhancement.  No significant degenerative changes seen within the cervical spine. No canal or neural foraminal stenosis.   Paraspinous soft tissues are normal.  IMPRESSION: 1. Patchy increased T2 hyperintensity throughout the cervical spinal cord without cord expansion, most notably at C2-3 and C5, compatible with history of multiple sclerosis. 2. 7 mm focus of enhancement within the left aspect of the cord at the level of C5 with additional 2 mm focus of enhancement within the dorsal cord at C6-7, compatible with active demyelination.   Electronically Signed   By: Jeannine Boga M.D.   On: 12/08/2013 00:15   Mr Darnelle Catalan Wo/w Cm  12/08/2013   CLINICAL DATA:  Multiple sclerosis with blurred vision and gait abnormality.  EXAM: MRI HEAD AND ORBITS WITHOUT AND WITH CONTRAST  TECHNIQUE: Multiplanar, multiecho pulse sequences of the brain and surrounding structures were obtained without and with intravenous contrast. Multiplanar, multiecho pulse sequences of the orbits and surrounding structures were obtained including fat saturation techniques, before and after intravenous contrast administration.  CONTRAST:  5mL MULTIHANCE GADOBENATE DIMEGLUMINE 529 MG/ML IV SOLN  COMPARISON:  Prior MRI from 09/09/2012.  FINDINGS: MRI HEAD FINDINGS  Scattered periventricular and subcortical white matter lesions again seen involving the periventricular and deep white matter of both cerebral hemispheres. Several of these are oriented perpendicular to the lateral ventricles, and several callosum septal lesions are present. Findings are consistent with history of multiple sclerosis.  There is a new 5 mm white matter lesion within the subcortical white matter of the high the right frontal lobe (series 6, image 23). This lesion enhances on post-contrast sequences (series 16, image 46), compatible with active demyelination. Additional subtle 3 mm enhancing lesion seen within the deep white matter of the left frontal lobe (series 16, image 30). There is a 3 mm enhancing lesion within the deep white matter of the left parietal lobe (series 16, image 29). 3 mm  enhancing lesion seen within the left splenium (series 16, image 33). There is an incomplete ring of active demyelination within the medial left cerebellar hemisphere (series 16, image 16). Small 3 mm enhancing focus seen just inferiorly within the left middle cerebellar peduncle (series 16, image 16). An additional tiny 3 mm enhancing focus seen within the right cerebellar hemisphere (series 16, image 18). There is corresponding T2/FLAIR signal intensity within these lesions.  No mass lesion, midline shift, or hydrocephalus. There is no extra-axial fluid collection. No evidence of acute intracranial infarct or hemorrhage.  Cervicomedullary junction is normal. Pituitary gland within normal limits.  Calvarium demonstrates a normal appearance with normal signal intensity.  The paranasal sinuses are clear. Mild T2 hyperintense mucosal thickening present within the left maxillary sinus. Minimal opacity seen within the 4 of the right frontal sinus. There is partial opacification of the left ethmoidal air cells.  No mastoid effusion.  MRI ORBITS FINDINGS  Dedicated views of the globes and optic nerves demonstrate mild left optic nerve atrophy (series 12, image 6)a. No associated enhancement to suggest active  inflammation. The left optic nerve is minimally fuzzy in appearance relative to the right without definite evidence of optic nerve atrophy. The optic chiasm is normal. The globes themselves are within normal limits. No abnormality seen within the intraconal or extraconal fat. The extraocular muscles are normal.  IMPRESSION: MRI BRAIN IMPRESSION:  1. Multiple enhancing foci involving the supratentorial white matter as detailed above, compatible with active demyelination related to multiple sclerosis flare. The most prominent of these lesions is located within the right frontal lobe and measures 4 mm. 2. Prominent focus of active demyelination within the left cerebellar hemisphere as above.  MRI ORBIT IMPRESSION:  1.  Left optic nerve atrophy. No enhancement to suggest acute optic neuritis identified.   Electronically Signed   By: Jeannine Boga M.D.   On: 12/08/2013 00:01    Scheduled Meds: . baclofen  5 mg Oral BID  . enoxaparin (LOVENOX) injection  40 mg Subcutaneous Q24H  . famotidine  20 mg Oral BID  . methylPREDNISolone (SOLU-MEDROL) injection  500 mg Intravenous Q12H  . sodium chloride  3 mL Intravenous Q12H   Continuous Infusions:   Principal Problem:   Exacerbation of multiple sclerosis Active Problems:   Anemia, iron deficiency    Time spent: 43min    Cataleah Stites K Niasia Lanphear  [facility] Pager [phone]. If 7PM-7AM, please contact night-coverage at [email or web address], password [email or web address] 12/08/2013, 10:40 AM  LOS: 1 day

## 2013-12-08 NOTE — Progress Notes (Signed)
Utilization review complete 

## 2013-12-08 NOTE — Progress Notes (Signed)
Subjective: Patient had no other complaints. She has not experienced improvement in vision. There is also been no change in strength of extremities. She's tolerating IV Solu-Medrol well, so far.  Objective: Current vital signs: BP 116/59  Pulse 63  Temp(Src) 97.8 F (36.6 C) (Oral)  Resp 18  Ht 5\' 5"  (1.651 m)  Wt 86.818 kg (191 lb 6.4 oz)  BMI 31.85 kg/m2  SpO2 99%  LMP 11/26/2013  Neurologic Exam: Patient is alert and in no acute distress. Mental status was normal. Pupils were equal and reacted normally to light. Extraocular movements were full and conjugate. No facial weakness was noted. Speech was normal. She had mild weakness proximally and distally in the right upper extremity. There was moderate weakness proximally in the left lower extremity and severe weakness proximally and distally involving right lower extremity. Coordination of upper extremities was normal.  Medications: I have reviewed the patient's current medications.  Assessment/Plan: 33 year old lady with MS exacerbation admitted for acute treatment intervention with IV Solu-Medrol, with plan to treat with 500 mg every 12 hours for a total of 6 doses. Physical and occupational therapy interventions recommended.  We will continue to follow this patient closely with you.  C.R. Nicole Kindred, MD [facility] [phone]  12/08/2013  10:01 AM

## 2013-12-08 NOTE — H&P (Signed)
[facility] History and Physical  Sarah [name] ZOX:[id number] DOB: [birth year] DOA: 12/07/2013  Referring physician: EDP PCP: No PCP Per Patient  Specialists:   Chief Complaint:   HPI: Sarah [name] is a 33 y.o. female with a history of Multiple Sclerosis diagnosed in 2003 currently on Gilenya Rx who presents to the ED with complaints of visual disturbance and a loss of balance which has been worsening over the past 3 weeks.  She also has right sided weakness and headache.   She was seen in the office of her Neurologist and sent for an MRI of the brain and C-spine which revealed demyelination and changes consistent with an MS exacerbation.  Left Optic Nerve atrophy was also seen.  She was seen by Neurology Dr. Leonel Ramsay in the ED and placed on IV Steroid rx, and referred for medical admission.      Review of Systems:  Constitutional: No Weight Loss, No Weight Gain, Night Sweats, Fevers, Chills, Fatigue, or Generalized Weakness HEENT: No Headaches, Difficulty Swallowing,Tooth/Dental Problems,Sore Throat,  No Sneezing, Rhinitis, Ear Ache, Nasal Congestion, or Post Nasal Drip,  Cardio-vascular:  No Chest pain, Orthopnea, PND, Edema in lower extremities, Anasarca, Dizziness, Palpitations  Resp: No Dyspnea, No DOE, No Productive Cough, No Non-Productive Cough, No Hemoptysis, No Change in Color of Mucus,  No Wheezing.    GI: No Heartburn, Indigestion, Abdominal Pain, Nausea, Vomiting, Diarrhea, Change in Bowel Habits,  Loss of Appetite  GU: No Dysuria, Change in Color of Urine, No Urgency or Frequency.  No flank pain.  Musculoskeletal: No Joint Pain or Swelling.  No Decreased Range of Motion. No Back Pain.  Neurologic: No Syncope, No Seizures, +Muscle Weakness, Paresthesia, +Vision Disturbance, No Diplopia, No Vertigo, +Difficulty Walking,  Skin: No Rash or Lesions. Psych: No Change in Mood or Affect. No Depression or Anxiety. No Memory loss. No Confusion or Hallucinations   Past Medical  History  Diagnosis Date  . Multiple sclerosis     Past Surgical History  Procedure Laterality Date  . Cesarean section    . Melanoma excision  age 3    on head    Prior to Admission medications   Medication Sig Start Date End Date Taking? Authorizing Provider  baclofen (LIORESAL) 10 MG tablet Take 0.5 tablets (5 mg total) by mouth 2 (two) times daily. 01/25/13  Yes Marcial Pacas, MD  Fingolimod HCl (GILENYA) 0.5 MG CAPS Take 0.5 mg by mouth daily.   Yes Historical Provider, MD  ibuprofen (ADVIL,MOTRIN) 800 MG tablet Take 800 mg by mouth every 8 (eight) hours as needed for moderate pain.   Yes Historical Provider, MD    No Known Allergies   Social History:  reports that she has never smoked. She does not have any smokeless tobacco history on file. She reports that she drinks alcohol. She reports that she does not use illicit drugs.     Family History  Problem Relation Age of Onset  . Diabetes Mother       Physical Exam:  GEN:  Pleasant Obese  33 y.o. African American female  examined  and in no acute distress; cooperative with exam Filed Vitals:   12/07/13 2330 12/08/13 0000 12/08/13 0030 12/08/13 0156  BP: 123/82 124/80 138/84 121/72  Pulse: 79 82 91 81  Temp:    97.6 F (36.4 C)  TempSrc:    Oral  Resp:    18  Height:      Weight:      SpO2: 100% 100% 100% 99%  Blood pressure 121/72, pulse 81, temperature 97.6 F (36.4 C), temperature source Oral, resp. rate 18, height 5\' 5"  (1.651 m), weight 86.818 kg (191 lb 6.4 oz), last menstrual period 11/26/2013, SpO2 99.00%. PSYCH: SHe is alert and oriented x4; does not appear anxious does not appear depressed; affect is normal HEENT: Normocephalic and Atraumatic, Mucous membranes pink; PERRLA; EOM intact; Fundi:  Benign;  No scleral icterus, Nares: Patent, Oropharynx: Clear, Fair Dentition, Neck:  FROM, no cervical lymphadenopathy nor thyromegaly or carotid bruit; no JVD; Breasts:: Not examined CHEST WALL: No tenderness CHEST:  Normal respiration, clear to auscultation bilaterally HEART: Regular rate and rhythm; no murmurs rubs or gallops BACK: No kyphosis or scoliosis; no CVA tenderness ABDOMEN: Positive Bowel Sounds, Obese, soft non-tender; no masses, no organomegaly, no pannus; no intertriginous candida. Rectal Exam: Not done EXTREMITIES: No  cyanosis, clubbing or edema; no ulcerations. Genitalia: not examined PULSES: 2+ and symmetric SKIN: Normal hydration no rash or ulceration CNS:  Alert and Oriented X 4, Cranial Nerves Intact, except Decreased Peripheral Vision, Sensation Intact,  Right upper Extremity and Right Lower Extremity Weakness 4/5,  DTRs 2+ Gait Not Assessed.   Vascular: pulses palpable throughout    Labs on Admission:  Basic Metabolic Panel:  Recent Labs Lab 12/07/13 1735  NA 141  K 4.0  CL 101  CO2 29  GLUCOSE 90  BUN 10  CREATININE 0.63  CALCIUM 8.9   Liver Function Tests: No results found for this basename: AST, ALT, ALKPHOS, BILITOT, PROT, ALBUMIN,  in the last 168 hours No results found for this basename: LIPASE, AMYLASE,  in the last 168 hours No results found for this basename: AMMONIA,  in the last 168 hours CBC:  Recent Labs Lab 12/07/13 1735  WBC 7.7  HGB 11.4*  HCT 34.3*  MCV 91.7  PLT 288   Cardiac Enzymes: No results found for this basename: CKTOTAL, CKMB, CKMBINDEX, TROPONINI,  in the last 168 hours  BNP (last 3 results) No results found for this basename: PROBNP,  in the last 8760 hours CBG: No results found for this basename: GLUCAP,  in the last 168 hours  Radiological Exams on Admission: Mr Jeri Cos Wo Contrast  12/08/2013   CLINICAL DATA:  Multiple sclerosis with blurred vision and gait abnormality.  EXAM: MRI HEAD AND ORBITS WITHOUT AND WITH CONTRAST  TECHNIQUE: Multiplanar, multiecho pulse sequences of the brain and surrounding structures were obtained without and with intravenous contrast. Multiplanar, multiecho pulse sequences of the orbits and  surrounding structures were obtained including fat saturation techniques, before and after intravenous contrast administration.  CONTRAST:  67mL MULTIHANCE GADOBENATE DIMEGLUMINE 529 MG/ML IV SOLN  COMPARISON:  Prior MRI from 09/09/2012.  FINDINGS: MRI HEAD FINDINGS  Scattered periventricular and subcortical white matter lesions again seen involving the periventricular and deep white matter of both cerebral hemispheres. Several of these are oriented perpendicular to the lateral ventricles, and several callosum septal lesions are present. Findings are consistent with history of multiple sclerosis.  There is a new 5 mm white matter lesion within the subcortical white matter of the high the right frontal lobe (series 6, image 23). This lesion enhances on post-contrast sequences (series 16, image 46), compatible with active demyelination. Additional subtle 3 mm enhancing lesion seen within the deep white matter of the left frontal lobe (series 16, image 30). There is a 3 mm enhancing lesion within the deep white matter of the left parietal lobe (series 16, image 29). 3 mm enhancing lesion seen within  the left splenium (series 16, image 33). There is an incomplete ring of active demyelination within the medial left cerebellar hemisphere (series 16, image 16). Small 3 mm enhancing focus seen just inferiorly within the left middle cerebellar peduncle (series 16, image 16). An additional tiny 3 mm enhancing focus seen within the right cerebellar hemisphere (series 16, image 18). There is corresponding T2/FLAIR signal intensity within these lesions.  No mass lesion, midline shift, or hydrocephalus. There is no extra-axial fluid collection. No evidence of acute intracranial infarct or hemorrhage.  Cervicomedullary junction is normal. Pituitary gland within normal limits.  Calvarium demonstrates a normal appearance with normal signal intensity.  The paranasal sinuses are clear. Mild T2 hyperintense mucosal thickening present  within the left maxillary sinus. Minimal opacity seen within the 4 of the right frontal sinus. There is partial opacification of the left ethmoidal air cells.  No mastoid effusion.  MRI ORBITS FINDINGS  Dedicated views of the globes and optic nerves demonstrate mild left optic nerve atrophy (series 12, image 6)a. No associated enhancement to suggest active inflammation. The left optic nerve is minimally fuzzy in appearance relative to the right without definite evidence of optic nerve atrophy. The optic chiasm is normal. The globes themselves are within normal limits. No abnormality seen within the intraconal or extraconal fat. The extraocular muscles are normal.  IMPRESSION: MRI BRAIN IMPRESSION:  1. Multiple enhancing foci involving the supratentorial white matter as detailed above, compatible with active demyelination related to multiple sclerosis flare. The most prominent of these lesions is located within the right frontal lobe and measures 4 mm. 2. Prominent focus of active demyelination within the left cerebellar hemisphere as above.  MRI ORBIT IMPRESSION:  1. Left optic nerve atrophy. No enhancement to suggest acute optic neuritis identified.   Electronically Signed   By: Jeannine Boga M.D.   On: 12/08/2013 00:01   Mr Cervical Spine W Wo Contrast  12/08/2013   CLINICAL DATA:  Multiple sclerosis, gait difficulties.  EXAM: MRI CERVICAL SPINE WITHOUT AND WITH CONTRAST  TECHNIQUE: Multiplanar and multiecho pulse sequences of the cervical spine, to include the craniocervical junction and cervicothoracic junction, were obtained according to standard protocol without and with intravenous contrast.  CONTRAST:  38mL MULTIHANCE GADOBENATE DIMEGLUMINE 529 MG/ML IV SOLN  COMPARISON:  Prior MRI from 09/09/2012.  FINDINGS: Abnormal enhancement within the left cerebellar hemisphere noted on pars to the viewed portions of the brain, better evaluated on concomitant brain MRI.  There is slight reversal of the normal  cervical lordosis with apex at C4, unchanged. No listhesis. Signal intensity within the vertebral body bone marrow is normal. Vertebral body heights and intervertebral disc spaces are well preserved.  On sagittal STIR sequence, extensive white matter changes again seen within the cervical spinal cord, compatible with history of multiple sclerosis. A lesion within the dorsal cord at C2-3 measures approximately 1.4 cm in length. Additional lesion within the dorsal cord at C5 measures approximately 1.5 cm in length. Patchy signal intensity within the right aspect of the cord at C3-4 is slightly increased in prominence relative to prior. Patchy hyperintense signal intensity within the cord extending distally from the T1-2 level grossly similar.  There is a 7 mm focus of enhancement within the left aspect of the cord at the level of C5, compatible with active demyelination (series 3, image 8). Additional tiny 2 mm focus of enhancement seen within the dorsal cord at the level of C6-7 (series 3, image 7). No other definite abnormal enhancement.  No significant degenerative changes seen within the cervical spine. No canal or neural foraminal stenosis.  Paraspinous soft tissues are normal.  IMPRESSION: 1. Patchy increased T2 hyperintensity throughout the cervical spinal cord without cord expansion, most notably at C2-3 and C5, compatible with history of multiple sclerosis. 2. 7 mm focus of enhancement within the left aspect of the cord at the level of C5 with additional 2 mm focus of enhancement within the dorsal cord at C6-7, compatible with active demyelination.   Electronically Signed   By: Jeannine Boga M.D.   On: 12/08/2013 00:15   Mr Darnelle Catalan Wo/w Cm  12/08/2013   CLINICAL DATA:  Multiple sclerosis with blurred vision and gait abnormality.  EXAM: MRI HEAD AND ORBITS WITHOUT AND WITH CONTRAST  TECHNIQUE: Multiplanar, multiecho pulse sequences of the brain and surrounding structures were obtained without and with  intravenous contrast. Multiplanar, multiecho pulse sequences of the orbits and surrounding structures were obtained including fat saturation techniques, before and after intravenous contrast administration.  CONTRAST:  44mL MULTIHANCE GADOBENATE DIMEGLUMINE 529 MG/ML IV SOLN  COMPARISON:  Prior MRI from 09/09/2012.  FINDINGS: MRI HEAD FINDINGS  Scattered periventricular and subcortical white matter lesions again seen involving the periventricular and deep white matter of both cerebral hemispheres. Several of these are oriented perpendicular to the lateral ventricles, and several callosum septal lesions are present. Findings are consistent with history of multiple sclerosis.  There is a new 5 mm white matter lesion within the subcortical white matter of the high the right frontal lobe (series 6, image 23). This lesion enhances on post-contrast sequences (series 16, image 46), compatible with active demyelination. Additional subtle 3 mm enhancing lesion seen within the deep white matter of the left frontal lobe (series 16, image 30). There is a 3 mm enhancing lesion within the deep white matter of the left parietal lobe (series 16, image 29). 3 mm enhancing lesion seen within the left splenium (series 16, image 33). There is an incomplete ring of active demyelination within the medial left cerebellar hemisphere (series 16, image 16). Small 3 mm enhancing focus seen just inferiorly within the left middle cerebellar peduncle (series 16, image 16). An additional tiny 3 mm enhancing focus seen within the right cerebellar hemisphere (series 16, image 18). There is corresponding T2/FLAIR signal intensity within these lesions.  No mass lesion, midline shift, or hydrocephalus. There is no extra-axial fluid collection. No evidence of acute intracranial infarct or hemorrhage.  Cervicomedullary junction is normal. Pituitary gland within normal limits.  Calvarium demonstrates a normal appearance with normal signal intensity.  The  paranasal sinuses are clear. Mild T2 hyperintense mucosal thickening present within the left maxillary sinus. Minimal opacity seen within the 4 of the right frontal sinus. There is partial opacification of the left ethmoidal air cells.  No mastoid effusion.  MRI ORBITS FINDINGS  Dedicated views of the globes and optic nerves demonstrate mild left optic nerve atrophy (series 12, image 6)a. No associated enhancement to suggest active inflammation. The left optic nerve is minimally fuzzy in appearance relative to the right without definite evidence of optic nerve atrophy. The optic chiasm is normal. The globes themselves are within normal limits. No abnormality seen within the intraconal or extraconal fat. The extraocular muscles are normal.  IMPRESSION: MRI BRAIN IMPRESSION:  1. Multiple enhancing foci involving the supratentorial white matter as detailed above, compatible with active demyelination related to multiple sclerosis flare. The most prominent of these lesions is located within the right frontal lobe and  measures 4 mm. 2. Prominent focus of active demyelination within the left cerebellar hemisphere as above.  MRI ORBIT IMPRESSION:  1. Left optic nerve atrophy. No enhancement to suggest acute optic neuritis identified.   Electronically Signed   By: Jeannine Boga M.D.   On: 12/08/2013 00:01      Assessment/Plan:   33 y.o. female with  Principal Problem:   Exacerbation of multiple sclerosis Active Problems:   Anemia, iron deficiency    1.   MS Exacerbation-   Start on High Dose IV Steroids,  Continue Gilenya Rx.   Pain control PRN.   Seen By Neuro In ED.     2.   Anemia-   Normocytic, send Anemia panel, and FOBT q day x 3.     3.   DVT Prophylaxis with Lovenox.       Code Status:  FULL CODE Family Communication:   Husband at Bedside Disposition Plan:    Inpatient     Time spent:  [facility] Pager [phone]  If 7PM-7AM, please contact  night-coverage [email or web address] Password [email or web address] 12/08/2013, 4:16 AM

## 2013-12-09 NOTE — Progress Notes (Signed)
Subjective: Patient had no new complaints. He is no longer having retro-orbital pain. Vision has not improved, however. She is tolerating Solu-Medrol 500 mg every 12 hours with no side effects.  Objective: Current vital signs: BP 119/63  Pulse 72  Temp(Src) 97.8 F (36.6 C) (Oral)  Resp 18  Ht 5\' 5"  (1.651 m)  Wt 86.818 kg (191 lb 6.4 oz)  BMI 31.85 kg/m2  SpO2 100%  LMP 11/26/2013  Neurologic Exam: Alert and in no acute distress. Mental status was normal. Pupils were equal and reacted normally to light. Extraocular movements were full and conjugate. No facial weakness was noted. Speech was normal. Mild proximal and distal right C. weakness was normal and unchanged. Moderately severe weakness proximally and mild weakness distally involving right lower extremity noted, with slight improvement in distal strength compared to yesterday.  Medications: I have reviewed the patient's current medications.  Assessment/Plan: MS exacerbation with minimal improvement with high-dose steroid treatment, including resolution of retro-orbital pain and improvement in strength in the right lower extremity distally.  No changes in current management anticipated. Patient is to continue current course of high-dose steroid treatment. We will continue to follow this patient closely with you.  C.R. Nicole Kindred, MD [facility] [phone]  12/09/2013  10:01 AM

## 2013-12-09 NOTE — Evaluation (Signed)
Physical Therapy Evaluation Patient Details Name: Sarah [name] MRN: [id number] DOB: 1981-03-12 Today's Date: 12/09/2013   History of Present Illness  Sarah [name] is a 33 y.o. female with a history of Multiple Sclerosis diagnosed in 2003. Pt adm with MS exacerbation with c/o visual and balance disturbances, as well as Rt sided weakness.   Clinical Impression  Pt adm due to the above. Presents with decreased strength and sensation in Rt sided, affecting pt balance. Pt to benefit from skilled acute PT to maximize functional mobility prior to D/C home with family.  Recommend pt ambulate with RW at this time to maximize mobility and independence while reducing risk of falls. Patient needs to practice stairs next session.     Follow Up Recommendations Outpatient PT;Supervision/Assistance - 24 hour    Equipment Recommendations  None recommended by PT    Recommendations for Other Services       Precautions / Restrictions Precautions Precautions: None Restrictions Weight Bearing Restrictions: No      Mobility  Bed Mobility Overal bed mobility: Modified Independent             General bed mobility comments: incr tim due to weakness  Transfers Overall transfer level: Modified independent Equipment used: None             General transfer comment: incr time due to weakness   Ambulation/Gait Ambulation/Gait assistance: Min guard Ambulation Distance (Feet): 200 Feet Assistive device: 1 person hand held assist Gait Pattern/deviations: Narrow base of support;Scissoring;Decreased stride length;Step-through pattern Gait velocity: decreased  Gait velocity interpretation: Below normal speed for age/gender General Gait Details: pt unsteady with gt; handheld (A) to mainatain balance; recommend ambulate with RW upon D/C; cues to increase BOS to increase stability; pt with difficulty with high level balance activities due to unsteadiness;    Stairs            Wheelchair  Mobility    Modified Rankin (Stroke Patients Only)       Balance Overall balance assessment: Needs assistance Sitting-balance support: Feet supported;No upper extremity supported Sitting balance-Leahy Scale: Normal     Standing balance support: During functional activity;No upper extremity supported Standing balance-Leahy Scale: Fair Standing balance comment: minimally weightshifting accepted              High level balance activites: Direction changes;Head turns High Level Balance Comments: unsteady; requires handheld (A) to maintain balance              Pertinent Vitals/[name] "[name] behind my eyes is better"     Home Living Family/patient expects to be discharged to:: Private residence Living Arrangements: Spouse/significant other;Children Available Help at Discharge: Family;Available 24 hours/day Type of Home: House Home Access: Stairs to enter Entrance Stairs-Rails: None Entrance Stairs-Number of Steps: 2 Home Layout: Able to live on main level with bedroom/bathroom Home Equipment: Walker - 2 wheels;Bedside commode;Shower seat      Prior Function Level of Independence: Independent               Hand Dominance   Dominant Hand: Right    Extremity/Trunk Assessment   Upper Extremity Assessment: RUE deficits/detail (Rt UE weakness ) RUE Deficits / Details: decreased grip strength vs Lt; ROM WFL    RUE Sensation: decreased light touch (c/o numbness)     Lower Extremity Assessment: RLE deficits/detail RLE Deficits / Details: grossly 4-/5     Cervical / Trunk Assessment: Normal  Communication   Communication: No difficulties  Cognition Arousal/Alertness: Awake/alert Behavior During Therapy: [facility]  for tasks assessed/performed Overall Cognitive Status: Within Functional Limits for tasks assessed                      General Comments      Exercises        Assessment/Plan    PT Assessment Patient needs continued PT services  PT  Diagnosis Abnormality of gait;Generalized weakness   PT Problem List Decreased strength;Decreased activity tolerance;Decreased balance;Decreased mobility;Impaired sensation  PT Treatment Interventions DME instruction;Gait training;Stair training;Functional mobility training;Therapeutic activities;Balance training;Therapeutic exercise;Neuromuscular re-education;Patient/family education   PT Goals (Current goals can be found in the Care Plan section) Acute Rehab PT Goals Patient Stated Goal: to go home PT Goal Formulation: With patient Time For Goal Achievement: 12/14/13 Potential to Achieve Goals: Good    Frequency Min 4X/week   Barriers to discharge        Co-evaluation               End of Session Equipment Utilized During Treatment: Gait belt Activity Tolerance: Patient tolerated treatment well Patient left: in chair;with call bell/phone within reach Nurse Communication: Mobility status         Time: 0263-7858 PT Time Calculation (min): 13 min   Charges:   PT Evaluation $Initial PT Evaluation Tier I: 1 Procedure PT Treatments $Gait Training: 8-22 mins   PT G CodesKennis [name] , [place]  [phone]  12/09/2013, 1:47 PM

## 2013-12-09 NOTE — Progress Notes (Signed)
[facility] PROGRESS NOTE  Sarah [name] QBH:[id number] DOB: October 20, 1980 DOA: 12/07/2013 PCP: No PCP Per Patient  Assessment/Plan: 1. MS Exacerbation - Cont on High Dose IV Steroids, [place] Rx. Neuro following with recs for cont IV solumedrol 500mg  q12hrs for total of 6 doses. PT/OT consulted. 2. Anemia - Normal CBC this AM 3. DVT Prophylaxis with Lovenox.  4. Vaginal yeast infection: Topical antifungal ordered overnight  Code Status: Full Family Communication: Pt in room (indicate person spoken with, relationship, and if by phone, the number) Disposition Plan: Pending   Consultants:  Neurology  HPI/Subjective: No major complaints. Overall feels slightly better  Objective: Filed Vitals:   12/18/2013 1400 Dec 18, 2013 2046 12/09/13 0156 12/09/13 0546  BP: 139/80 121/71 128/75 119/63  Pulse: 63 65 103 72  Temp: 98.6 F (37 C) 98.9 F (37.2 C) 98 F (36.7 C) 97.8 F (36.6 C)  TempSrc: Oral     Resp: 20 18 18 18   Height:      Weight:      SpO2: 98% 100% 95% 100%    Intake/Output Summary (Last 24 hours) at 12/09/13 0831 Last data filed at Dec 18, 2013 1300  Gross per 24 hour  Intake    240 ml  Output      0 ml  Net    240 ml   Filed Weights   12/07/13 1736  Weight: 86.818 kg (191 lb 6.4 oz)    Exam:   General:  Awake, in nad  Cardiovascular: regular, s1, s2  Respiratory: normal resp effort, no wheezing  Abdomen: soft, nondistended  Musculoskeletal: perfused, no clubbing   Data Reviewed: Basic Metabolic Panel:  Recent Labs Lab 12/07/13 1735 2013/12/18 0538  NA 141 141  K 4.0 4.0  CL 101 105  CO2 29 24  GLUCOSE 90 113*  BUN 10 9  CREATININE 0.63 0.58  CALCIUM 8.9 9.2   Liver Function Tests: No results found for this basename: AST, ALT, ALKPHOS, BILITOT, PROT, ALBUMIN,  in the last 168 hours No results found for this basename: LIPASE, AMYLASE,  in the last 168 hours No results found for this basename: AMMONIA,  in the last 168 hours CBC:  Recent  Labs Lab 12/07/13 1735 18-Dec-2013 0538  WBC 7.7 7.4  HGB 11.4* 12.3  HCT 34.3* 36.6  MCV 91.7 90.6  PLT 288 307   Cardiac Enzymes: No results found for this basename: CKTOTAL, CKMB, CKMBINDEX, TROPONINI,  in the last 168 hours BNP (last 3 results) No results found for this basename: PROBNP,  in the last 8760 hours CBG: No results found for this basename: GLUCAP,  in the last 168 hours  No results found for this or any previous visit (from the past 240 hour(s)).   Studies: Mr Kizzie Fantasia Contrast  2013-12-18   CLINICAL DATA:  Multiple sclerosis with blurred vision and gait abnormality.  EXAM: MRI HEAD AND ORBITS WITHOUT AND WITH CONTRAST  TECHNIQUE: Multiplanar, multiecho pulse sequences of the brain and surrounding structures were obtained without and with intravenous contrast. Multiplanar, multiecho pulse sequences of the orbits and surrounding structures were obtained including fat saturation techniques, before and after intravenous contrast administration.  CONTRAST:  83mL MULTIHANCE GADOBENATE DIMEGLUMINE 529 MG/ML IV SOLN  COMPARISON:  Prior MRI from 09/09/2012.  FINDINGS: MRI HEAD FINDINGS  Scattered periventricular and subcortical white matter lesions again seen involving the periventricular and deep white matter of both cerebral hemispheres. Several of these are oriented perpendicular to the lateral ventricles, and several callosum septal lesions are  present. Findings are consistent with history of multiple sclerosis.  There is a new 5 mm white matter lesion within the subcortical white matter of the high the right frontal lobe (series 6, image 23). This lesion enhances on post-contrast sequences (series 16, image 46), compatible with active demyelination. Additional subtle 3 mm enhancing lesion seen within the deep white matter of the left frontal lobe (series 16, image 30). There is a 3 mm enhancing lesion within the deep white matter of the left parietal lobe (series 16, image 29). 3 mm  enhancing lesion seen within the left splenium (series 16, image 33). There is an incomplete ring of active demyelination within the medial left cerebellar hemisphere (series 16, image 16). Small 3 mm enhancing focus seen just inferiorly within the left middle cerebellar peduncle (series 16, image 16). An additional tiny 3 mm enhancing focus seen within the right cerebellar hemisphere (series 16, image 18). There is corresponding T2/FLAIR signal intensity within these lesions.  No mass lesion, midline shift, or hydrocephalus. There is no extra-axial fluid collection. No evidence of acute intracranial infarct or hemorrhage.  Cervicomedullary junction is normal. Pituitary gland within normal limits.  Calvarium demonstrates a normal appearance with normal signal intensity.  The paranasal sinuses are clear. Mild T2 hyperintense mucosal thickening present within the left maxillary sinus. Minimal opacity seen within the 4 of the right frontal sinus. There is partial opacification of the left ethmoidal air cells.  No mastoid effusion.  MRI ORBITS FINDINGS  Dedicated views of the globes and optic nerves demonstrate mild left optic nerve atrophy (series 12, image 6)a. No associated enhancement to suggest active inflammation. The left optic nerve is minimally fuzzy in appearance relative to the right without definite evidence of optic nerve atrophy. The optic chiasm is normal. The globes themselves are within normal limits. No abnormality seen within the intraconal or extraconal fat. The extraocular muscles are normal.  IMPRESSION: MRI BRAIN IMPRESSION:  1. Multiple enhancing foci involving the supratentorial white matter as detailed above, compatible with active demyelination related to multiple sclerosis flare. The most prominent of these lesions is located within the right frontal lobe and measures 4 mm. 2. Prominent focus of active demyelination within the left cerebellar hemisphere as above.  MRI ORBIT IMPRESSION:  1.  Left optic nerve atrophy. No enhancement to suggest acute optic neuritis identified.   Electronically Signed   By: Jeannine Boga M.D.   On: 12/08/2013 00:01   Mr Cervical Spine W Wo Contrast  12/08/2013   CLINICAL DATA:  Multiple sclerosis, gait difficulties.  EXAM: MRI CERVICAL SPINE WITHOUT AND WITH CONTRAST  TECHNIQUE: Multiplanar and multiecho pulse sequences of the cervical spine, to include the craniocervical junction and cervicothoracic junction, were obtained according to standard protocol without and with intravenous contrast.  CONTRAST:  85mL MULTIHANCE GADOBENATE DIMEGLUMINE 529 MG/ML IV SOLN  COMPARISON:  Prior MRI from 09/09/2012.  FINDINGS: Abnormal enhancement within the left cerebellar hemisphere noted on pars to the viewed portions of the brain, better evaluated on concomitant brain MRI.  There is slight reversal of the normal cervical lordosis with apex at C4, unchanged. No listhesis. Signal intensity within the vertebral body bone marrow is normal. Vertebral body heights and intervertebral disc spaces are well preserved.  On sagittal STIR sequence, extensive white matter changes again seen within the cervical spinal cord, compatible with history of multiple sclerosis. A lesion within the dorsal cord at C2-3 measures approximately 1.4 cm in length. Additional lesion within the dorsal cord at  C5 measures approximately 1.5 cm in length. Patchy signal intensity within the right aspect of the cord at C3-4 is slightly increased in prominence relative to prior. Patchy hyperintense signal intensity within the cord extending distally from the T1-2 level grossly similar.  There is a 7 mm focus of enhancement within the left aspect of the cord at the level of C5, compatible with active demyelination (series 3, image 8). Additional tiny 2 mm focus of enhancement seen within the dorsal cord at the level of C6-7 (series 3, image 7). No other definite abnormal enhancement.  No significant degenerative  changes seen within the cervical spine. No canal or neural foraminal stenosis.  Paraspinous soft tissues are normal.  IMPRESSION: 1. Patchy increased T2 hyperintensity throughout the cervical spinal cord without cord expansion, most notably at C2-3 and C5, compatible with history of multiple sclerosis. 2. 7 mm focus of enhancement within the left aspect of the cord at the level of C5 with additional 2 mm focus of enhancement within the dorsal cord at C6-7, compatible with active demyelination.   Electronically Signed   By: Jeannine Boga M.D.   On: 12/08/2013 00:15   Mr Darnelle Catalan Wo/w Cm  12/08/2013   CLINICAL DATA:  Multiple sclerosis with blurred vision and gait abnormality.  EXAM: MRI HEAD AND ORBITS WITHOUT AND WITH CONTRAST  TECHNIQUE: Multiplanar, multiecho pulse sequences of the brain and surrounding structures were obtained without and with intravenous contrast. Multiplanar, multiecho pulse sequences of the orbits and surrounding structures were obtained including fat saturation techniques, before and after intravenous contrast administration.  CONTRAST:  37mL MULTIHANCE GADOBENATE DIMEGLUMINE 529 MG/ML IV SOLN  COMPARISON:  Prior MRI from 09/09/2012.  FINDINGS: MRI HEAD FINDINGS  Scattered periventricular and subcortical white matter lesions again seen involving the periventricular and deep white matter of both cerebral hemispheres. Several of these are oriented perpendicular to the lateral ventricles, and several callosum septal lesions are present. Findings are consistent with history of multiple sclerosis.  There is a new 5 mm white matter lesion within the subcortical white matter of the high the right frontal lobe (series 6, image 23). This lesion enhances on post-contrast sequences (series 16, image 46), compatible with active demyelination. Additional subtle 3 mm enhancing lesion seen within the deep white matter of the left frontal lobe (series 16, image 30). There is a 3 mm enhancing lesion  within the deep white matter of the left parietal lobe (series 16, image 29). 3 mm enhancing lesion seen within the left splenium (series 16, image 33). There is an incomplete ring of active demyelination within the medial left cerebellar hemisphere (series 16, image 16). Small 3 mm enhancing focus seen just inferiorly within the left middle cerebellar peduncle (series 16, image 16). An additional tiny 3 mm enhancing focus seen within the right cerebellar hemisphere (series 16, image 18). There is corresponding T2/FLAIR signal intensity within these lesions.  No mass lesion, midline shift, or hydrocephalus. There is no extra-axial fluid collection. No evidence of acute intracranial infarct or hemorrhage.  Cervicomedullary junction is normal. Pituitary gland within normal limits.  Calvarium demonstrates a normal appearance with normal signal intensity.  The paranasal sinuses are clear. Mild T2 hyperintense mucosal thickening present within the left maxillary sinus. Minimal opacity seen within the 4 of the right frontal sinus. There is partial opacification of the left ethmoidal air cells.  No mastoid effusion.  MRI ORBITS FINDINGS  Dedicated views of the globes and optic nerves demonstrate mild left optic nerve atrophy (  series 12, image 6)a. No associated enhancement to suggest active inflammation. The left optic nerve is minimally fuzzy in appearance relative to the right without definite evidence of optic nerve atrophy. The optic chiasm is normal. The globes themselves are within normal limits. No abnormality seen within the intraconal or extraconal fat. The extraocular muscles are normal.  IMPRESSION: MRI BRAIN IMPRESSION:  1. Multiple enhancing foci involving the supratentorial white matter as detailed above, compatible with active demyelination related to multiple sclerosis flare. The most prominent of these lesions is located within the right frontal lobe and measures 4 mm. 2. Prominent focus of active  demyelination within the left cerebellar hemisphere as above.  MRI ORBIT IMPRESSION:  1. Left optic nerve atrophy. No enhancement to suggest acute optic neuritis identified.   Electronically Signed   By: Jeannine Boga M.D.   On: 12/08/2013 00:01    Scheduled Meds: . baclofen  5 mg Oral BID  . clotrimazole  1 Applicatorful Vaginal QHS  . enoxaparin (LOVENOX) injection  40 mg Subcutaneous Q24H  . famotidine  20 mg Oral BID  . methylPREDNISolone (SOLU-MEDROL) injection  500 mg Intravenous Q12H  . sodium chloride  3 mL Intravenous Q12H   Continuous Infusions:   Principal Problem:   Exacerbation of multiple sclerosis Active Problems:   Anemia, iron deficiency    Time spent: 25min    Stephen K Chiu  [facility] Pager [phone]. If 7PM-7AM, please contact night-coverage at [email or web address], password [facility] 12/09/2013, 8:31 AM  LOS: 2 days

## 2013-12-10 ENCOUNTER — Telehealth: Payer: Self-pay | Admitting: Neurology

## 2013-12-10 MED ORDER — CLOTRIMAZOLE 1 % VA CREA: 1.0000 | TOPICAL_CREAM | Freq: Every day | VAGINAL | Status: DC

## 2013-12-10 NOTE — Progress Notes (Signed)
Occupational Therapy Evaluation and Discharge Patient Details Name: Sarah [name] MRN: [id number] DOB: 1981/03/01 Today's Date: 12/10/2013    History of Present Illness Sarah [name] is a 33 y.o. female with a history of Multiple Sclerosis diagnosed in 2003. Pt adm with MS exacerbation with c/o visual and balance disturbances, as well as Rt sided weakness.    Clinical Impression   PTA pt lived at home and was independent with ADLs and functional mobility. Pt reports her last MS exacerbation was in February of 2014 and typically her exacerbations have a few years in between. Pt reports this as a typical exacerbation with the addition of blurry vision. Educated pt on safety at home and compensatory techniques for vision. Educated pt on fall prevention strategies. Issued pt theraband (red and Sarah [name]) for UE strengthening and pictures for exercises with written directions. Pt has no further ADL concerns. Acute OT to sign off at this time.     Follow Up Recommendations  No OT follow up;Supervision/Assistance - 24 hour    Equipment Recommendations  None recommended by OT       Precautions / Restrictions Precautions Precautions: None Restrictions Weight Bearing Restrictions: No      Mobility Bed Mobility Overal bed mobility: Modified Independent                Transfers Overall transfer level: Modified independent Equipment used: None             General transfer comment: incr time due to weakness          ADL Overall ADL's : Modified independent                                       General ADL Comments: Pt reports that she has made adaptations for increased independence with her ADLs. Pt has no ADL concerns at this time. Educated pt on fall prevention and safety at home with functional mobility and energy conservation techniques.      Vision  Pt reports blurry vision: near and far. Educated pt on safety and compensatory techniques with vision  impairment. Encouraged pt to discuss vision with PCP.                       Praxis Praxis Praxis tested?: Within functional limits    Pertinent Vitals/[name] No c/o [name].      Hand Dominance Right   Extremity/Trunk Assessment Upper Extremity Assessment Upper Extremity Assessment: RUE deficits/detail RUE Deficits / Details: decreased grip strength vs Lt; ROM WFL  RUE Sensation: decreased light touch (c/o numbness)   Lower Extremity Assessment Lower Extremity Assessment: Defer to PT evaluation   Cervical / Trunk Assessment Cervical / Trunk Assessment: Normal   Communication Communication Communication: No difficulties   Cognition Arousal/Alertness: Awake/alert Behavior During Therapy: WFL for tasks assessed/performed Overall Cognitive Status: Within Functional Limits for tasks assessed                        Exercises Exercises: General Upper Extremity  Shoulder Flexion: AROM, Strengthening, Both, 10 reps, Seated, Theraband (red) level 2 Shoulder Extension: AROM, Strengthening, Both, 10 reps, Seated, Theraband (red) level 2 Elbow Flexion: AROM, Strengthening, Both, 10 reps, Seated, Theraband (red) level 2 Elbow Extension: AROM, Strengthening, Both, 10 reps, Seated/Supine, Theraband (red) level 2        Home Living Family/patient expects to be discharged  to:: Private residence Living Arrangements: Spouse/significant other;Children Available Help at Discharge: Family;Available 24 hours/day Type of Home: House Home Access: Stairs to enter [employer] of Steps: 2 Entrance Stairs-Rails: None Home Layout: Able to live on main level with bedroom/bathroom     Bathroom Shower/Tub: Tub/shower unit Shower/tub characteristics: Curtain Biochemist, clinical: Standard     Home Equipment: Environmental consultant - 2 wheels;Bedside commode;Shower seat          Prior Functioning/Environment Level of Independence: Independent                                        End of Session Equipment Utilized During Treatment: Other (comment) (theraband (red and Sarah [name]))  Activity Tolerance: Patient tolerated treatment well Patient left: in bed;with call bell/phone within reach;with family/visitor present   Time: 1152-1215 OT Time Calculation (min): 23 min Charges:  OT General Charges $OT Visit: 1 Procedure OT Evaluation $Initial OT Evaluation Tier I: 1 Procedure OT Treatments $Self Care/Home Management : 8-22 mins  Sarah [name]  [phone]  12/10/2013, 12:34 PM

## 2013-12-10 NOTE — Discharge Summary (Signed)
Physician Discharge Summary  Sarah [name] [id number] DOB: 1980/10/24 DOA: 12/07/2013  PCP: No PCP Per Patient  Admit date: 12/07/2013 Discharge date: 12/10/2013  Time spent: 35 minutes  Recommendations for Outpatient Follow-up:  1. Follow up with PCP in 1-2 weeks 2. Follow up with Neurology as scheduled  Discharge Diagnoses:  Principal Problem:   Exacerbation of multiple sclerosis Active Problems:   Anemia, iron deficiency   Discharge Condition: Improved  Diet recommendation: Regular  Filed Weights   12/07/13 1736  Weight: 86.818 kg (191 lb 6.4 oz)    History of present illness:  Sarah [name] is a 33 y.o. female with a history of Multiple Sclerosis diagnosed in 2003 currently on Gilenya Rx who presents to the ED with complaints of visual disturbance and a loss of balance which has been worsening over the past 3 weeks. She also has right sided weakness and headache. She was seen in the office of her Neurologist and sent for an MRI of the brain and C-spine which revealed demyelination and changes consistent with an MS exacerbation. Left Optic Nerve atrophy was also seen. She was seen by Neurology Dr. Leonel Ramsay in the ED and placed on IV Steroid rx, and referred for medical admission.   Hospital Course:  1. MS Exacerbation - Cont on High Dose IV Steroids, Gilenya Rx. Neuro following with recs for cont IV solumedrol 500mg  q12hrs for total of 6 doses. PT/OT consulted - recs for outpt PT. Pt has since noted much improvement.  2. Anemia - Normal CBC thus far  3. DVT Prophylaxis with Lovenox.  4. Vaginal yeast infection: Topical antifungal was ordered  Consultations:  Neurology  Discharge Exam: Filed Vitals:   12/09/13 1433 12/09/13 2123 12/10/13 0549 12/10/13 1300  BP: 118/70 124/69 126/72 133/64  Pulse: 62 58 50 80  Temp: 99.4 F (37.4 C) 98.7 F (37.1 C) 98 F (36.7 C) 98 F (36.7 C)  TempSrc: Oral Oral    Resp: 20 19 18 18   Height:      Weight:      SpO2: 100%  100% 100% 100%    General: awake, in nad Cardiovascular: regular, s1, s2 Respiratory: normal resp effort, no wheezing  Discharge Instructions       Future Appointments Provider Department Dept Phone   04/17/2014 8:30 AM Dennie Bible, NP [facility] [phone]       Medication List         baclofen 10 MG tablet  Commonly known as:  LIORESAL  Take 0.5 tablets (5 mg total) by mouth 2 (two) times daily.     clotrimazole 1 % vaginal cream  Commonly known as:  GYNE-LOTRIMIN  Place 1 Applicatorful vaginally at bedtime.     GILENYA 0.5 MG Caps  Generic drug:  Fingolimod HCl  Take 0.5 mg by mouth daily.     ibuprofen 800 MG tablet  Commonly known as:  ADVIL,MOTRIN  Take 800 mg by mouth every 8 (eight) hours as needed for moderate pain.       No Known Allergies Follow-up Information   Follow up with No PCP Per Patient. Schedule an appointment as soon as possible for a visit in 1 week.   Specialty:  General Practice      Follow up with Marcial Pacas, MD. (as scheduled)    Specialty:  Neurology   Contact information:   [place] [id number] [phone]        The results of significant diagnostics from this  hospitalization (including imaging, microbiology, ancillary and laboratory) are listed below for reference.    Significant Diagnostic Studies: Mr Kizzie Fantasia Contrast  21-Dec-2013   CLINICAL DATA:  Multiple sclerosis with blurred vision and gait abnormality.  EXAM: MRI HEAD AND ORBITS WITHOUT AND WITH CONTRAST  TECHNIQUE: Multiplanar, multiecho pulse sequences of the brain and surrounding structures were obtained without and with intravenous contrast. Multiplanar, multiecho pulse sequences of the orbits and surrounding structures were obtained including fat saturation techniques, before and after intravenous contrast administration.  CONTRAST:  4mL MULTIHANCE GADOBENATE DIMEGLUMINE 529 MG/ML IV SOLN  COMPARISON:  Prior MRI  from 09/09/2012.  FINDINGS: MRI HEAD FINDINGS  Scattered periventricular and subcortical white matter lesions again seen involving the periventricular and deep white matter of both cerebral hemispheres. Several of these are oriented perpendicular to the lateral ventricles, and several callosum septal lesions are present. Findings are consistent with history of multiple sclerosis.  There is a new 5 mm white matter lesion within the subcortical white matter of the high the right frontal lobe (series 6, image 23). This lesion enhances on post-contrast sequences (series 16, image 46), compatible with active demyelination. Additional subtle 3 mm enhancing lesion seen within the deep white matter of the left frontal lobe (series 16, image 30). There is a 3 mm enhancing lesion within the deep white matter of the left parietal lobe (series 16, image 29). 3 mm enhancing lesion seen within the left splenium (series 16, image 33). There is an incomplete ring of active demyelination within the medial left cerebellar hemisphere (series 16, image 16). Small 3 mm enhancing focus seen just inferiorly within the left middle cerebellar peduncle (series 16, image 16). An additional tiny 3 mm enhancing focus seen within the right cerebellar hemisphere (series 16, image 18). There is corresponding T2/FLAIR signal intensity within these lesions.  No mass lesion, midline shift, or hydrocephalus. There is no extra-axial fluid collection. No evidence of acute intracranial infarct or hemorrhage.  Cervicomedullary junction is normal. Pituitary gland within normal limits.  Calvarium demonstrates a normal appearance with normal signal intensity.  The paranasal sinuses are clear. Mild T2 hyperintense mucosal thickening present within the left maxillary sinus. Minimal opacity seen within the 4 of the right frontal sinus. There is partial opacification of the left ethmoidal air cells.  No mastoid effusion.  MRI ORBITS FINDINGS  Dedicated views of  the globes and optic nerves demonstrate mild left optic nerve atrophy (series 12, image 6)a. No associated enhancement to suggest active inflammation. The left optic nerve is minimally fuzzy in appearance relative to the right without definite evidence of optic nerve atrophy. The optic chiasm is normal. The globes themselves are within normal limits. No abnormality seen within the intraconal or extraconal fat. The extraocular muscles are normal.  IMPRESSION: MRI BRAIN IMPRESSION:  1. Multiple enhancing foci involving the supratentorial white matter as detailed above, compatible with active demyelination related to multiple sclerosis flare. The most prominent of these lesions is located within the right frontal lobe and measures 4 mm. 2. Prominent focus of active demyelination within the left cerebellar hemisphere as above.  MRI ORBIT IMPRESSION:  1. Left optic nerve atrophy. No enhancement to suggest acute optic neuritis identified.   Electronically Signed   By: Jeannine Boga M.D.   On: 12-21-2013 00:01   Mr Cervical Spine W Wo Contrast  12-21-13   CLINICAL DATA:  Multiple sclerosis, gait difficulties.  EXAM: MRI CERVICAL SPINE WITHOUT AND WITH CONTRAST  TECHNIQUE: Multiplanar and  multiecho pulse sequences of the cervical spine, to include the craniocervical junction and cervicothoracic junction, were obtained according to standard protocol without and with intravenous contrast.  CONTRAST:  63mL MULTIHANCE GADOBENATE DIMEGLUMINE 529 MG/ML IV SOLN  COMPARISON:  Prior MRI from 09/09/2012.  FINDINGS: Abnormal enhancement within the left cerebellar hemisphere noted on pars to the viewed portions of the brain, better evaluated on concomitant brain MRI.  There is slight reversal of the normal cervical lordosis with apex at C4, unchanged. No listhesis. Signal intensity within the vertebral body bone marrow is normal. Vertebral body heights and intervertebral disc spaces are well preserved.  On sagittal STIR  sequence, extensive white matter changes again seen within the cervical spinal cord, compatible with history of multiple sclerosis. A lesion within the dorsal cord at C2-3 measures approximately 1.4 cm in length. Additional lesion within the dorsal cord at C5 measures approximately 1.5 cm in length. Patchy signal intensity within the right aspect of the cord at C3-4 is slightly increased in prominence relative to prior. Patchy hyperintense signal intensity within the cord extending distally from the T1-2 level grossly similar.  There is a 7 mm focus of enhancement within the left aspect of the cord at the level of C5, compatible with active demyelination (series 3, image 8). Additional tiny 2 mm focus of enhancement seen within the dorsal cord at the level of C6-7 (series 3, image 7). No other definite abnormal enhancement.  No significant degenerative changes seen within the cervical spine. No canal or neural foraminal stenosis.  Paraspinous soft tissues are normal.  IMPRESSION: 1. Patchy increased T2 hyperintensity throughout the cervical spinal cord without cord expansion, most notably at C2-3 and C5, compatible with history of multiple sclerosis. 2. 7 mm focus of enhancement within the left aspect of the cord at the level of C5 with additional 2 mm focus of enhancement within the dorsal cord at C6-7, compatible with active demyelination.   Electronically Signed   By: Jeannine Boga M.D.   On: 12/08/2013 00:15   Mr Darnelle Catalan Wo/w Cm  12/08/2013   CLINICAL DATA:  Multiple sclerosis with blurred vision and gait abnormality.  EXAM: MRI HEAD AND ORBITS WITHOUT AND WITH CONTRAST  TECHNIQUE: Multiplanar, multiecho pulse sequences of the brain and surrounding structures were obtained without and with intravenous contrast. Multiplanar, multiecho pulse sequences of the orbits and surrounding structures were obtained including fat saturation techniques, before and after intravenous contrast administration.  CONTRAST:   35mL MULTIHANCE GADOBENATE DIMEGLUMINE 529 MG/ML IV SOLN  COMPARISON:  Prior MRI from 09/09/2012.  FINDINGS: MRI HEAD FINDINGS  Scattered periventricular and subcortical white matter lesions again seen involving the periventricular and deep white matter of both cerebral hemispheres. Several of these are oriented perpendicular to the lateral ventricles, and several callosum septal lesions are present. Findings are consistent with history of multiple sclerosis.  There is a new 5 mm white matter lesion within the subcortical white matter of the high the right frontal lobe (series 6, image 23). This lesion enhances on post-contrast sequences (series 16, image 46), compatible with active demyelination. Additional subtle 3 mm enhancing lesion seen within the deep white matter of the left frontal lobe (series 16, image 30). There is a 3 mm enhancing lesion within the deep white matter of the left parietal lobe (series 16, image 29). 3 mm enhancing lesion seen within the left splenium (series 16, image 33). There is an incomplete ring of active demyelination within the medial left cerebellar hemisphere (series 16,  image 16). Small 3 mm enhancing focus seen just inferiorly within the left middle cerebellar peduncle (series 16, image 16). An additional tiny 3 mm enhancing focus seen within the right cerebellar hemisphere (series 16, image 18). There is corresponding T2/FLAIR signal intensity within these lesions.  No mass lesion, midline shift, or hydrocephalus. There is no extra-axial fluid collection. No evidence of acute intracranial infarct or hemorrhage.  Cervicomedullary junction is normal. Pituitary gland within normal limits.  Calvarium demonstrates a normal appearance with normal signal intensity.  The paranasal sinuses are clear. Mild T2 hyperintense mucosal thickening present within the left maxillary sinus. Minimal opacity seen within the 4 of the right frontal sinus. There is partial opacification of the left  ethmoidal air cells.  No mastoid effusion.  MRI ORBITS FINDINGS  Dedicated views of the globes and optic nerves demonstrate mild left optic nerve atrophy (series 12, image 6)a. No associated enhancement to suggest active inflammation. The left optic nerve is minimally fuzzy in appearance relative to the right without definite evidence of optic nerve atrophy. The optic chiasm is normal. The globes themselves are within normal limits. No abnormality seen within the intraconal or extraconal fat. The extraocular muscles are normal.  IMPRESSION: MRI BRAIN IMPRESSION:  1. Multiple enhancing foci involving the supratentorial white matter as detailed above, compatible with active demyelination related to multiple sclerosis flare. The most prominent of these lesions is located within the right frontal lobe and measures 4 mm. 2. Prominent focus of active demyelination within the left cerebellar hemisphere as above.  MRI ORBIT IMPRESSION:  1. Left optic nerve atrophy. No enhancement to suggest acute optic neuritis identified.   Electronically Signed   By: Jeannine Boga M.D.   On: 12/08/2013 00:01    Microbiology: No results found for this or any previous visit (from the past 240 hour(s)).   Labs: Basic Metabolic Panel:  Recent Labs Lab 12/07/13 1735 12/08/13 0538  NA 141 141  K 4.0 4.0  CL 101 105  CO2 29 24  GLUCOSE 90 113*  BUN 10 9  CREATININE 0.63 0.58  CALCIUM 8.9 9.2   Liver Function Tests: No results found for this basename: AST, ALT, ALKPHOS, BILITOT, PROT, ALBUMIN,  in the last 168 hours No results found for this basename: LIPASE, AMYLASE,  in the last 168 hours No results found for this basename: AMMONIA,  in the last 168 hours CBC:  Recent Labs Lab 12/07/13 1735 12/08/13 0538  WBC 7.7 7.4  HGB 11.4* 12.3  HCT 34.3* 36.6  MCV 91.7 90.6  PLT 288 307   Cardiac Enzymes: No results found for this basename: CKTOTAL, CKMB, CKMBINDEX, TROPONINI,  in the last 168 hours BNP: BNP  (last 3 results) No results found for this basename: PROBNP,  in the last 8760 hours CBG: No results found for this basename: GLUCAP,  in the last 168 hours   Signed:  [facility] 12/10/2013, 2:51 PM

## 2013-12-10 NOTE — Progress Notes (Signed)
Physical Therapy Treatment Patient Details Name: Sarah [name] MRN: [id number] DOB: [birth year] Today's Date: 12/10/2013    History of Present Illness Sarah [name] is a 33 y.o. female with a history of Multiple Sclerosis diagnosed in 2003. Pt adm with MS exacerbation with c/o visual and balance disturbances, as well as Rt sided weakness.     PT Comments    Patient walking well with use of RW. She is highly motivated to progress and able to complete stair training. Patient is hoping that she can go home today with her fiance. If patient does not DC with attempt ambulation without RW next session  Follow Up Recommendations  Outpatient PT;Supervision/Assistance - 24 hour     Equipment Recommendations  None recommended by PT    Recommendations for Other Services       Precautions / Restrictions Precautions Precautions: None    Mobility  Bed Mobility Overal bed mobility: Modified Independent                Transfers Overall transfer level: Modified independent                  Ambulation/Gait Ambulation/Gait assistance: Supervision Ambulation Distance (Feet): 20 Feet Assistive device: Rolling walker (2 wheeled) Gait Pattern/deviations: Step-through pattern Gait velocity: decreased    General Gait Details: Patient used RW this session. Continues to be guarded with gait and cautious. May try without RW next session if patient still here   Stairs Stairs: Yes Stairs assistance: Min guard Stair Management: One rail Left;Step to pattern;Forwards Number of Stairs: 5 General stair comments: Paitent having difficulty lifting toe up on top of steps but compensates well with knee flexion  Wheelchair Mobility    Modified Rankin (Stroke Patients Only)       Balance                                    Cognition Arousal/Alertness: Awake/alert Behavior During Therapy: WFL for tasks assessed/performed Overall Cognitive Status: Within Functional  Limits for tasks assessed                      Exercises      General Comments        Pertinent Vitals/[name] Denied [name]    Home Living                      Prior Function            PT Goals (current goals can now be found in the care plan section) Progress towards PT goals: Progressing toward goals    Frequency  Min 4X/week    PT Plan Current plan remains appropriate    Co-evaluation             End of Session Equipment Utilized During Treatment: Gait belt Activity Tolerance: Patient tolerated treatment well Patient left: with call bell/phone within reach;in bed     Time: 9470-9628 PT Time Calculation (min): 16 min  Charges:  $Gait Training: 8-22 mins                    G Codes:      Tonia Brooms Robinette 12/10/2013, 11:56 AM 12/10/2013 [place] PTA [phone] pager [phone] office

## 2013-12-10 NOTE — Telephone Encounter (Signed)
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

## 2013-12-10 NOTE — Progress Notes (Signed)
[facility] PROGRESS NOTE  Sarah [name] ZOX:[id number] DOB: 1980-12-01 DOA: 12/07/2013 PCP: No PCP Per Patient  Assessment/Plan: 1. MS Exacerbation - Cont on High Dose IV Steroids, Gilenya Rx. Neuro following with recs for cont IV solumedrol 500mg  q12hrs for total of 6 doses. PT/OT consulted - recs for outpt PT. Pt has since noted much improvement. 2. Anemia - Normal CBC thus far 3. DVT Prophylaxis with Lovenox.  4. Vaginal yeast infection: Topical antifungal was ordered  Code Status: Full Family Communication: Pt in room (indicate person spoken with, relationship, and if by phone, the number) Disposition Plan: Pending  Consultants:  Neurology  HPI/Subjective: Feels much better. No complaints. Eager to go home.  Objective: Filed Vitals:   12/09/13 0546 12/09/13 1433 12/09/13 2123 12/10/13 0549  BP: 119/63 118/70 124/69 126/72  Pulse: 72 62 58 50  Temp: 97.8 F (36.6 C) 99.4 F (37.4 C) 98.7 F (37.1 C) 98 F (36.7 C)  TempSrc:  Oral Oral   Resp: 18 20 19 18   Height:      Weight:      SpO2: 100% 100% 100% 100%    Intake/Output Summary (Last 24 hours) at 12/10/13 0936 Last data filed at 12/09/13 1300  Gross per 24 hour  Intake    240 ml  Output      0 ml  Net    240 ml   Filed Weights   12/07/13 1736  Weight: 86.818 kg (191 lb 6.4 oz)    Exam:   General:  Awake, in nad  Cardiovascular: regular, s1, s2  Respiratory: normal resp effort, no wheezing  Abdomen: soft, nondistended  Musculoskeletal: perfused, no clubbing   Data Reviewed: Basic Metabolic Panel:  Recent Labs Lab 12/07/13 1735 12/08/13 0538  NA 141 141  Sarah 4.0 4.0  CL 101 105  CO2 29 24  GLUCOSE 90 113*  BUN 10 9  CREATININE 0.63 0.58  CALCIUM 8.9 9.2   Liver Function Tests: No results found for this basename: AST, ALT, ALKPHOS, BILITOT, PROT, ALBUMIN,  in the last 168 hours No results found for this basename: LIPASE, AMYLASE,  in the last 168 hours No results found for this  basename: AMMONIA,  in the last 168 hours CBC:  Recent Labs Lab 12/07/13 1735 12/08/13 0538  WBC 7.7 7.4  HGB 11.4* 12.3  HCT 34.3* 36.6  MCV 91.7 90.6  PLT 288 307   Cardiac Enzymes: No results found for this basename: CKTOTAL, CKMB, CKMBINDEX, TROPONINI,  in the last 168 hours BNP (last 3 results) No results found for this basename: PROBNP,  in the last 8760 hours CBG: No results found for this basename: GLUCAP,  in the last 168 hours  No results found for this or any previous visit (from the past 240 hour(s)).   Studies: No results found.  Scheduled Meds: . baclofen  5 mg Oral BID  . clotrimazole  1 Applicatorful Vaginal QHS  . enoxaparin (LOVENOX) injection  40 mg Subcutaneous Q24H  . famotidine  20 mg Oral BID  . methylPREDNISolone (SOLU-MEDROL) injection  500 mg Intravenous Q12H  . sodium chloride  3 mL Intravenous Q12H   Continuous Infusions:   Principal Problem:   Exacerbation of multiple sclerosis Active Problems:   Anemia, iron deficiency    Time spent: 44min    Sarah [name] Sarah [name]  [facility] Pager [phone]. If 7PM-7AM, please contact night-coverage at [email or web address], password [email or web address] 12/10/2013, 9:36 AM  LOS: 3 days

## 2013-12-10 NOTE — Progress Notes (Signed)
NEURO HOSPITALIST PROGRESS NOTE   SUBJECTIVE:                                                                                                                        Patient is feeling much improved. Vision is back to her baseline and right arm and leg weakness has improved to 95%.   OBJECTIVE:                                                                                                                           Vital signs in last 24 hours: Temp:  [98 F (36.7 C)-99.4 F (37.4 C)] 98 F (36.7 C) (05/04 0549) Pulse Rate:  [50-62] 50 (05/04 0549) Resp:  [18-20] 18 (05/04 0549) BP: (118-126)/(69-72) 126/72 mmHg (05/04 0549) SpO2:  [100 %] 100 % (05/04 0549)  Intake/Output from previous day: 05/03 0701 - 05/04 0700 In: 480 [P.O.:480] Out: -  Intake/Output this shift: Total I/O In: 240 [P.O.:240] Out: -  Nutritional status: General  Past Medical History  Diagnosis Date  . Multiple sclerosis      Neurologic Exam:  Mental Status: Alert, oriented, thought content appropriate.  Speech fluent without evidence of aphasia.  Able to follow 3 step commands without difficulty. Cranial Nerves: II: Visual fields grossly normal, pupils equal, round, reactive to light and accommodation III,IV, VI: ptosis not present, extra-ocular motions intact bilaterally V,VII: smile symmetric, facial light touch sensation normal bilaterally VIII: hearing normal bilaterally IX,X: gag reflex present XI: bilateral shoulder shrug XII: midline tongue extension without atrophy or fasciculations  Motor: Right : Upper extremity   4/5 Basline   Left:     Upper extremity   5/5  Lower extremity   4/5 Baseline    Lower extremity   5/5 Tone and bulk:normal tone throughout; no atrophy noted Sensory: Pinprick and light touch intact throughout, bilaterally Deep Tendon Reflexes:  Right: Upper Extremity   Left: Upper extremity   biceps (C-5 to C-6) 2/4   biceps (C-5 to  C-6) 2/4 tricep (C7) 2/4    triceps (C7) 2/4 Brachioradialis (C6) 2/4  Brachioradialis (C6) 2/4  Lower Extremity Lower Extremity  quadriceps (L-2 to L-4) 2/4   quadriceps (L-2 to L-4) 2/4 Achilles (S1) 2/4  Achilles (S1) 2/4  Plantars: Right: downgoing   Left: downgoing Cerebellar: normal finger-to-nose,  normal heel-to-shin test CV: pulses palpable throughout    Lab Results: Basic Metabolic Panel:  Recent Labs Lab 12/07/13 1735 12/08/13 0538  NA 141 141  K 4.0 4.0  CL 101 105  CO2 29 24  GLUCOSE 90 113*  BUN 10 9  CREATININE 0.63 0.58  CALCIUM 8.9 9.2    Liver Function Tests: No results found for this basename: AST, ALT, ALKPHOS, BILITOT, PROT, ALBUMIN,  in the last 168 hours No results found for this basename: LIPASE, AMYLASE,  in the last 168 hours No results found for this basename: AMMONIA,  in the last 168 hours  CBC:  Recent Labs Lab 12/07/13 1735 12/08/13 0538  WBC 7.7 7.4  HGB 11.4* 12.3  HCT 34.3* 36.6  MCV 91.7 90.6  PLT 288 307    Cardiac Enzymes: No results found for this basename: CKTOTAL, CKMB, CKMBINDEX, TROPONINI,  in the last 168 hours  Lipid Panel: No results found for this basename: CHOL, TRIG, HDL, CHOLHDL, VLDL, LDLCALC,  in the last 168 hours  CBG: No results found for this basename: GLUCAP,  in the last 168 hours  Microbiology: No results found for this or any previous visit.  Coagulation Studies: No results found for this basename: LABPROT, INR,  in the last 72 hours  Imaging: No results found.     MEDICATIONS                                                                                                                        Scheduled: . baclofen  5 mg Oral BID  . clotrimazole  1 Applicatorful Vaginal QHS  . enoxaparin (LOVENOX) injection  40 mg Subcutaneous Q24H  . famotidine  20 mg Oral BID  . methylPREDNISolone (SOLU-MEDROL) injection  500 mg Intravenous Q12H  . sodium chloride  3 mL Intravenous Q12H     ASSESSMENT/PLAN:                                                                                                            MS exacerbation.  S/P 6 doses solumedrol and feeling back to her baseline.  No complaints of retro-orbital pain or right sided weakness.  After her final dose patient may be discharged from neurology standpoint.  She is to follow up with Dr. Krista Blue as out patient.   Neurology S/O   Assessment and plan discussed with with attending physician and they are in agreement.    Shanon Brow  [facility] [phone]  12/10/2013, 12:47 PM

## 2013-12-20 NOTE — Telephone Encounter (Signed)
Sarah [name], give her a follow up appt with me in 2-4 weeks.

## 2013-12-21 NOTE — Telephone Encounter (Signed)
Called Patient left message asking her to call back to schedule appt.

## 2013-12-24 NOTE — Telephone Encounter (Signed)
Called patient and left her a message to schedule follow up in the next 2-4 weeks with Dr.Yan. Asked patient to give the office a call back.

## 2013-12-25 NOTE — Telephone Encounter (Signed)
Called patient and left another message to call back and schedule  a follow up with Dr.Yan.

## 2013-12-26 ENCOUNTER — Emergency Department (HOSPITAL_COMMUNITY): Payer: No Typology Code available for payment source

## 2013-12-26 ENCOUNTER — Encounter (HOSPITAL_COMMUNITY): Payer: Self-pay | Admitting: Emergency Medicine

## 2013-12-26 ENCOUNTER — Emergency Department (HOSPITAL_COMMUNITY)
Admission: EM | Admit: 2013-12-26 | Discharge: 2013-12-26 | Disposition: A | Discharge status: Home / Self Care | Payer: No Typology Code available for payment source | Attending: Emergency Medicine | Admitting: Emergency Medicine

## 2013-12-26 DIAGNOSIS — Y92410 Unspecified street and highway as the place of occurrence of the external cause: Secondary | ICD-10-CM | POA: Insufficient documentation

## 2013-12-26 DIAGNOSIS — Y9389 Activity, other specified: Secondary | ICD-10-CM | POA: Insufficient documentation

## 2013-12-26 DIAGNOSIS — Z79899 Other long term (current) drug therapy: Secondary | ICD-10-CM | POA: Insufficient documentation

## 2013-12-26 DIAGNOSIS — S99919A Unspecified injury of unspecified ankle, initial encounter: Secondary | ICD-10-CM

## 2013-12-26 DIAGNOSIS — S199XXA Unspecified injury of neck, initial encounter: Secondary | ICD-10-CM

## 2013-12-26 DIAGNOSIS — G35 Multiple sclerosis: Secondary | ICD-10-CM | POA: Insufficient documentation

## 2013-12-26 DIAGNOSIS — S99929A Unspecified injury of unspecified foot, initial encounter: Secondary | ICD-10-CM

## 2013-12-26 DIAGNOSIS — S0993XA Unspecified injury of face, initial encounter: Secondary | ICD-10-CM | POA: Insufficient documentation

## 2013-12-26 DIAGNOSIS — S0990XA Unspecified injury of head, initial encounter: Secondary | ICD-10-CM | POA: Insufficient documentation

## 2013-12-26 DIAGNOSIS — S8990XA Unspecified injury of unspecified lower leg, initial encounter: Secondary | ICD-10-CM | POA: Insufficient documentation

## 2013-12-26 LAB — COMPREHENSIVE METABOLIC PANEL
ALK PHOS: 83 U/L (ref 39–117)
ALT: 28 U/L (ref 0–35)
AST: 29 U/L (ref 0–37)
Albumin: 3.7 g/dL (ref 3.5–5.2)
BUN: 11 mg/dL (ref 6–23)
CHLORIDE: 105 meq/L (ref 96–112)
CO2: 25 meq/L (ref 19–32)
Calcium: 8.9 mg/dL (ref 8.4–10.5)
Creatinine, Ser: 0.74 mg/dL (ref 0.50–1.10)
GFR calc non Af Amer: >90 mL/min (ref >90)
GLUCOSE: 95 mg/dL (ref 70–99)
POTASSIUM: 4.5 meq/L (ref 3.7–5.3)
SODIUM: 141 meq/L (ref 137–147)
TOTAL PROTEIN: 7.4 g/dL (ref 6.0–8.3)
Total Bilirubin: 0.3 mg/dL (ref 0.3–1.2)

## 2013-12-26 LAB — CBC
HCT: 35 % — ABNORMAL LOW (ref 36.0–46.0)
HEMOGLOBIN: 11.9 g/dL — AB (ref 12.0–15.0)
MCH: 30.9 pg (ref 26.0–34.0)
MCHC: 34 g/dL (ref 30.0–36.0)
MCV: 90.9 fL (ref 78.0–100.0)
Platelets: 240 10*3/uL (ref 150–400)
RBC: 3.85 MIL/uL — ABNORMAL LOW (ref 3.87–5.11)
RDW: 13 % (ref 11.5–15.5)
WBC: 4.7 10*3/uL (ref 4.0–10.5)

## 2013-12-26 MED ORDER — LORAZEPAM 2 MG/ML IJ SOLN
1.0000 mg | Freq: Once | INTRAMUSCULAR | Status: AC
Start: 2013-12-26 — End: 2013-12-26
  Administered 2013-12-26: 1 mg via INTRAVENOUS
  Filled 2013-12-26: qty 1

## 2013-12-26 MED ORDER — TRAMADOL HCL 50 MG PO TABS: 50.0000 mg | ORAL_TABLET | Freq: Four times a day (QID) | ORAL | Status: DC | PRN

## 2013-12-26 MED ORDER — SODIUM CHLORIDE 0.9 % IV BOLUS (SEPSIS)
125.0000 mL | Freq: Once | INTRAVENOUS | Status: AC
  Administered 2013-12-26: 125 mL via INTRAVENOUS

## 2013-12-26 MED ORDER — DIAZEPAM 5 MG PO TABS: 5.0000 mg | ORAL_TABLET | Freq: Two times a day (BID) | ORAL | Status: AC

## 2013-12-26 MED ORDER — IBUPROFEN 600 MG PO TABS: 600.0000 mg | ORAL_TABLET | Freq: Three times a day (TID) | ORAL | Status: AC
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
## 2013-12-26 NOTE — ED Notes (Signed)
Husband at bedside.  
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
## 2014-01-07 ENCOUNTER — Encounter: Payer: Self-pay | Admitting: Nurse Practitioner

## 2014-01-07 ENCOUNTER — Telehealth: Payer: Self-pay | Admitting: Nurse Practitioner

## 2014-01-07 NOTE — Telephone Encounter (Signed)
Called patient regarding rescheduling 04/17/14 appointment per Carolyn's schedule, but patient's voicemail was full. Printed and sent letter with new appointment time.

## 2014-01-11 ENCOUNTER — Ambulatory Visit: Payer: Self-pay | Admitting: Neurology

## 2014-01-11 ENCOUNTER — Telehealth: Payer: Self-pay
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
## 2014-04-17 ENCOUNTER — Ambulatory Visit: Payer: Self-pay | Admitting: Nurse Practitioner

## 2014-05-02 ENCOUNTER — Ambulatory Visit: Payer: Self-pay | Admitting: Nurse Practitioner

## 2015-05-23 LAB — OB RESULTS CONSOLE HIV ANTIBODY (ROUTINE TESTING): HIV: NONREACTIVE

## 2015-05-23 LAB — OB RESULTS CONSOLE ANTIBODY SCREEN: ANTIBODY SCREEN: NEGATIVE

## 2015-05-23 LAB — OB RESULTS CONSOLE HEPATITIS B SURFACE ANTIGEN: Hepatitis B Surface Ag: NEGATIVE

## 2015-05-23 LAB — OB RESULTS CONSOLE RUBELLA ANTIBODY, IGM: RUBELLA: IMMUNE

## 2015-05-23 LAB — OB RESULTS CONSOLE HGB/HCT, BLOOD
HCT: 34 %
HEMOGLOBIN: 11 g/dL

## 2015-05-23 LAB — OB RESULTS CONSOLE GBS: STREP GROUP B AG: NEGATIVE

## 2015-05-23 LAB — OB RESULTS CONSOLE ABO/RH: RH TYPE: POSITIVE

## 2015-05-23 LAB — OB RESULTS CONSOLE RPR: RPR: NONREACTIVE

## 2015-05-23 LAB — OB RESULTS CONSOLE PLATELET COUNT: Platelets: 318 10*3/uL

## 2015-06-05 LAB — OB RESULTS CONSOLE GC/CHLAMYDIA
Chlamydia: NEGATIVE
Gonorrhea: NEGATIVE

## 2015-08-10 HISTORY — PX: TUBAL LIGATION: SHX77

## 2015-08-10 HISTORY — DX: Maternal care for unspecified type scar from previous cesarean delivery: O34.219

## 2015-08-22 ENCOUNTER — Encounter: Payer: Self-pay | Admitting: *Deleted

## 2015-08-22 DIAGNOSIS — O09299 Supervision of pregnancy with other poor reproductive or obstetric history, unspecified trimester: Secondary | ICD-10-CM | POA: Insufficient documentation

## 2015-08-22 DIAGNOSIS — O34219 Maternal care for unspecified type scar from previous cesarean delivery: Secondary | ICD-10-CM | POA: Insufficient documentation

## 2015-08-22 DIAGNOSIS — G35 Multiple sclerosis: Secondary | ICD-10-CM

## 2015-08-22 DIAGNOSIS — O0990 Supervision of high risk pregnancy, unspecified, unspecified trimester: Secondary | ICD-10-CM

## 2015-08-22 DIAGNOSIS — Z98891 History of uterine scar from previous surgery: Secondary | ICD-10-CM

## 2015-08-22 DIAGNOSIS — O99350 Diseases of the nervous system complicating pregnancy, unspecified trimester: Secondary | ICD-10-CM

## 2015-08-22 HISTORY — DX: Supervision of high risk pregnancy, unspecified, unspecified trimester: O09.90

## 2015-08-22 HISTORY — DX: Supervision of pregnancy with other poor reproductive or obstetric history, unspecified trimester: O09.299

## 2015-08-22 HISTORY — DX: Multiple sclerosis: O99.350

## 2015-08-22 HISTORY — DX: Multiple sclerosis: G35

## 2015-09-09 ENCOUNTER — Ambulatory Visit (INDEPENDENT_AMBULATORY_CARE_PROVIDER_SITE_OTHER): Payer: Managed Care, Other (non HMO) | Admitting: Advanced Practice Midwife

## 2015-09-09 ENCOUNTER — Encounter: Payer: Self-pay | Admitting: Advanced Practice Midwife

## 2015-09-09 VITALS — BP 127/70 | HR 71 | Temp 98.1°F | Wt 214.0 lb

## 2015-09-09 DIAGNOSIS — O99350 Diseases of the nervous system complicating pregnancy, unspecified trimester: Secondary | ICD-10-CM

## 2015-09-09 DIAGNOSIS — Z3482 Encounter for supervision of other normal pregnancy, second trimester: Secondary | ICD-10-CM

## 2015-09-09 DIAGNOSIS — Z8759 Personal history of other complications of pregnancy, childbirth and the puerperium: Secondary | ICD-10-CM | POA: Insufficient documentation

## 2015-09-09 DIAGNOSIS — O0932 Supervision of pregnancy with insufficient antenatal care, second trimester: Secondary | ICD-10-CM

## 2015-09-09 DIAGNOSIS — N390 Urinary tract infection, site not specified: Secondary | ICD-10-CM

## 2015-09-09 DIAGNOSIS — R82998 Other abnormal findings in urine: Secondary | ICD-10-CM

## 2015-09-09 DIAGNOSIS — N3942 Incontinence without sensory awareness: Secondary | ICD-10-CM

## 2015-09-09 DIAGNOSIS — G35D Multiple sclerosis, unspecified: Secondary | ICD-10-CM

## 2015-09-09 DIAGNOSIS — G35 Multiple sclerosis: Secondary | ICD-10-CM

## 2015-09-09 DIAGNOSIS — O0990 Supervision of high risk pregnancy, unspecified, unspecified trimester: Secondary | ICD-10-CM

## 2015-09-09 HISTORY — DX: Supervision of pregnancy with insufficient antenatal care, second trimester: O09.32

## 2015-09-09 HISTORY — DX: Personal history of other complications of pregnancy, childbirth and the puerperium: Z87.59

## 2015-09-09 LAB — POCT URINALYSIS DIP (DEVICE)
BILIRUBIN URINE: NEGATIVE
GLUCOSE, UA: NEGATIVE mg/dL
Ketones, ur: NEGATIVE mg/dL
Nitrite: NEGATIVE
Protein, ur: NEGATIVE mg/dL
Specific Gravity, Urine: 1.02 (ref 1.005–1.030)
UROBILINOGEN UA: 1 mg/dL (ref 0.0–1.0)
pH: 7 (ref 5.0–8.0)

## 2015-09-09 NOTE — Progress Notes (Signed)
New OB, See SmartSet  Subjective:    Sarah [name] is a [id number] [redacted]w[redacted]d being seen today for her first obstetrical visit.  Her obstetrical history is significant for Prev C/S x 3, Late care, hx 22 wk iufd, MS. Patient does intend to breast feed. Pregnancy history fully reviewed.  Patient reports urinary leaking almost all day.  Filed Vitals:   09/09/15 0808  BP: 127/70  Pulse: 71  Temp: 98.1 F (36.7 C)  Weight: 214 lb (97.07 kg)    HISTORY: OB History  Gravida Para Term Preterm AB SAB TAB Ectopic Multiple Living  5 4 2 2  0 0 0 0 1 4    # Outcome Date GA Lbr Len/2nd Weight Sex Delivery Anes PTL Lv  5 Current           4A Preterm 11/24/07 [redacted]w[redacted]d  5 lb 6 oz (2.438 kg) F CS-Unspec Spinal N Y     Comments: early c/s due to baby a girl starting to lose too much weight  4B Preterm 11/24/07 [redacted]w[redacted]d  5 lb 4 oz (2.381 kg) M CS-Unspec Spinal N Y  3 Term 03/12/05 [redacted]w[redacted]d  8 lb 10 oz (3.912 kg) M CS-Unspec Spinal N Y  2 Term 07/10/03 [redacted]w[redacted]d  8 lb 9 oz (3.884 kg) F CS-Unspec EPI N Y  1 Preterm 2003 [redacted]w[redacted]d    Vag-Spont   ND     Past Medical History  Diagnosis Date  . Multiple sclerosis ([place])   . Heart murmur   . Multiple sclerosis ([place])   . Melanoma ([place]) 1996    on head  . Headache    Past Surgical History  Procedure Laterality Date  . Cesarean section    . Melanoma excision  age 52    on head   Family History  Problem Relation Age of Onset  . Diabetes Mother   . Diabetes Maternal Uncle   . Hypertension Maternal Grandmother      Exam    Uterus:  Fundal Height: 24 cm  Pelvic Exam:    Perineum: No Hemorrhoids, Normal Perineum   Vulva: Bartholin's, Urethra, Skene's normal   Vagina:  normal mucosa, normal discharge   pH:    Cervix: multiparous appearance   Adnexa: no mass, fullness, tenderness   Bony Pelvis: gynecoid  System: Breast:  normal appearance, no masses or tenderness   Skin: normal coloration and turgor, no rashes    Neurologic: oriented, grossly non-focal   Extremities: normal strength, tone, and muscle mass   HEENT neck supple with midline trachea   Mouth/Teeth mucous membranes moist, pharynx normal without lesions   Neck supple and no masses   Cardiovascular: regular rate and rhythm, no murmurs or gallops   Respiratory:  appears well, vitals normal, no respiratory distress, acyanotic, normal RR, ear and throat exam is normal, neck free of mass or lymphadenopathy, chest clear, no wheezing, crepitations, rhonchi, normal symmetric air entry   Abdomen: soft, non-tender; bowel sounds normal; no masses,  no organomegaly   Urinary: urethral meatus normal      Assessment:    Pregnancy: [id number] Patient Active Problem List   Diagnosis Date Noted  . History of twin pregnancy in prior pregnancy 09/09/2015  . Late prenatal care affecting pregnancy in second trimester, antepartum 09/09/2015  . Supervision of high-risk pregnancy 08/22/2015  . History of C-section 08/22/2015  . Prior pregnancy with fetal demise 08/22/2015  . Multiple sclerosis ([place]) 08/22/2015  . Multiple sclerosis affecting pregnancy ([place]) 08/22/2015  . Anemia,  iron deficiency 09/09/2012  . Urinary incontinence 09/09/2012        Plan:     Initial labs drawn. Prenatal vitamins. Problem list reviewed and updated. Genetic Screening discussed Quad Screen: too late.  Ultrasound discussed; fetal survey: ordered.  Follow up in 4 weeks. 50% of 30 min visit spent on counseling and coordination of care.   Encouraged to make appt with Neurologist to let them know she is pregnant (has not seen them lately)    [facility] 09/09/2015

## 2015-09-09 NOTE — Progress Notes (Signed)
Patient reports recent bladder incontinence in past for weeks, unsure if it is related to MS or not

## 2015-09-09 NOTE — Patient Instructions (Signed)

## 2015-09-11 ENCOUNTER — Other Ambulatory Visit: Payer: Self-pay | Admitting: *Deleted

## 2015-09-11 DIAGNOSIS — O0932 Supervision of pregnancy with insufficient antenatal care, second trimester: Secondary | ICD-10-CM

## 2015-09-14 LAB — CULTURE, OB URINE

## 2015-09-16 ENCOUNTER — Ambulatory Visit (HOSPITAL_COMMUNITY)
Admission: RE | Admit: 2015-09-16 | Discharge: 2015-09-16 | Disposition: A | Discharge status: Home / Self Care | Payer: Managed Care, Other (non HMO) | Source: Ambulatory Visit | Attending: Advanced Practice Midwife | Admitting: Advanced Practice Midwife

## 2015-09-16 ENCOUNTER — Other Ambulatory Visit: Payer: Self-pay | Admitting: Advanced Practice Midwife

## 2015-09-16 ENCOUNTER — Encounter (HOSPITAL_COMMUNITY): Payer: Self-pay

## 2015-09-16 ENCOUNTER — Encounter: Payer: Self-pay | Admitting: Family Medicine

## 2015-09-16 VITALS — BP 119/56 | HR 91 | Wt 211.0 lb

## 2015-09-16 DIAGNOSIS — Z3482 Encounter for supervision of other normal pregnancy, second trimester: Secondary | ICD-10-CM

## 2015-09-16 DIAGNOSIS — O99212 Obesity complicating pregnancy, second trimester: Secondary | ICD-10-CM | POA: Diagnosis not present

## 2015-09-16 DIAGNOSIS — O09892 Supervision of other high risk pregnancies, second trimester: Secondary | ICD-10-CM

## 2015-09-16 DIAGNOSIS — Z85820 Personal history of malignant melanoma of skin: Secondary | ICD-10-CM | POA: Insufficient documentation

## 2015-09-16 DIAGNOSIS — Z3A25 25 weeks gestation of pregnancy: Secondary | ICD-10-CM | POA: Diagnosis present

## 2015-09-16 DIAGNOSIS — Z3689 Encounter for other specified antenatal screening: Secondary | ICD-10-CM

## 2015-09-16 DIAGNOSIS — O34219 Maternal care for unspecified type scar from previous cesarean delivery: Secondary | ICD-10-CM

## 2015-09-16 DIAGNOSIS — O99352 Diseases of the nervous system complicating pregnancy, second trimester: Secondary | ICD-10-CM | POA: Insufficient documentation

## 2015-09-16 DIAGNOSIS — G35 Multiple sclerosis: Secondary | ICD-10-CM | POA: Insufficient documentation

## 2015-09-16 DIAGNOSIS — O09212 Supervision of pregnancy with history of pre-term labor, second trimester: Secondary | ICD-10-CM

## 2015-09-16 DIAGNOSIS — O09292 Supervision of pregnancy with other poor reproductive or obstetric history, second trimester: Secondary | ICD-10-CM | POA: Diagnosis not present

## 2015-09-16 DIAGNOSIS — O99350 Diseases of the nervous system complicating pregnancy, unspecified trimester: Secondary | ICD-10-CM

## 2015-09-16 DIAGNOSIS — G35D Multiple sclerosis, unspecified: Secondary | ICD-10-CM

## 2015-09-16 DIAGNOSIS — O0990 Supervision of high risk pregnancy, unspecified, unspecified trimester: Secondary | ICD-10-CM

## 2015-09-16 MED ORDER — NITROFURANTOIN MONOHYD MACRO 100 MG PO CAPS: 100.0000 mg | ORAL_CAPSULE | Freq: Two times a day (BID) | ORAL | Status: DC

## 2015-09-16 NOTE — Progress Notes (Signed)
Has staph and enterococcus in urine  Both sensitive to Macrobid  Rx put in

## 2015-09-16 NOTE — Progress Notes (Signed)
MATERNAL FETAL MEDICINE CONSULT  Patient Name: Sarah [name] Medical Record Number:  [id number] Date of Birth: 1981/01/30 Requesting Physician Name:  Seabron Spates, CNM Date of Service: 09/16/2015  Chief Complaint Multiple sclerosis  History of Present Illness Sarah [name] was seen today secondary to multiple sclerosis at the request of Seabron Spates, CNM.  The patient is a 35 y.o. [id number] [redacted]w[redacted]d with an EDD of 12/27/2015, by Last Menstrual Period dating method.  Ms. Boeck was diagnosed with MS in 2002.  Her predominant symptoms have been left sided numbness, bladder incontinence, and blurred vision.  She does report bladder incontinence recently, but not numbness or blurred vision above her baseline.  She has been on medications for MS before (she does not remember the name of the most recent med), but stopped it when she learned she was pregnant.  She reports that she has not had a flare of her MS after delivery with any of her 4 prior pregnancies.  She did report that she had seizures after her second delivery, but reports a cause was never determined.  She has not had any seizures since that time.  She has no other issues in this pregnancy and has no acute complaints.  Review of Systems Pertinent items are noted in HPI.  Patient History OB History  Gravida Para Term Preterm AB SAB TAB Ectopic Multiple Living  5 4 2 2  0 0 0 0 1 4    # Outcome Date GA Lbr Len/2nd Weight Sex Delivery Anes PTL Lv  5 Current           4A Preterm 11/24/07 [redacted]w[redacted]d  5 lb 6 oz (2.438 kg) F CS-Unspec Spinal N Y     Comments: early c/s due to baby a girl starting to lose too much weight  4B Preterm 11/24/07 [redacted]w[redacted]d  5 lb 4 oz (2.381 kg) M CS-Unspec Spinal N Y  3 Term 03/12/05 [redacted]w[redacted]d  8 lb 10 oz (3.912 kg) M CS-Unspec Spinal N Y  2 Term 07/10/03 [redacted]w[redacted]d  8 lb 9 oz (3.884 kg) F CS-Unspec EPI N Y  1 Preterm 2003 [redacted]w[redacted]d    Vag-Spont   ND      Past Medical History  Diagnosis Date  . Multiple sclerosis ([place])    . Heart murmur   . Multiple sclerosis ([place])   . Melanoma ([place]) 1996    on head  . Headache     Past Surgical History  Procedure Laterality Date  . Cesarean section    . Melanoma excision  age 26    on head    Social History   Social History  . Marital Status: Married    Spouse Name: N/A  . Number of Children: N/A  . Years of Education: N/A   Social History Main Topics  . Smoking status: Never Smoker   . Smokeless tobacco: Never Used  . Alcohol Use: No  . Drug Use: No  . Sexual Activity: Yes   Other Topics Concern  . Not on file   Social History Narrative    Family History  Problem Relation Age of Onset  . Diabetes Mother   . Diabetes Maternal Uncle   . Hypertension Maternal Grandmother    In addition, the patient has no family history of mental retardation, birth defects, or genetic diseases.  Physical Examination Vitals - BP 119/56, Pulse 91, Weight 211 lbs General appearance - alert, well appearing, and in no distress  Assessment and Recommendations 1.  Multiple Sclerosis.  Although Ms. Laningham does have bladder incontinence which is a common symptoms of her MS, she thinks this is more related to her pregnancy rather than MS as she is not having any of her other MS symptoms at this time.  I advised Ms. Hladik to make an appointment to see her Neurologist Dr. Krista Blue soon to discuss this further.  Typically MS remains dormant during pregnancy and in many cases improves.  However, there is an increased risk of a flare in the first 6 weeks post partum so Ms. Lawson should be followed closely after delivery.  MS does not require any change in typical prenatal care nor does it require antenatal fetal testing unless the disease becomes active.  If a flare does occur they are typically treated with corticosteroids, which are safe in pregnancy after 10 weeks of gestation.  I spent 30 minutes with Ms. Pafford today of which 50% was face-to-face counseling.  Thank you for  referring Ms. Rottmann to the [facility].  Please do not hesitate to contact us with questions.   Jolyn Lent, MD

## 2015-09-17 ENCOUNTER — Telehealth: Payer: Self-pay | Admitting: *Deleted

## 2015-09-17 MED ORDER — NITROFURANTOIN MONOHYD MACRO 100 MG PO CAPS: 100.0000 mg | ORAL_CAPSULE | Freq: Two times a day (BID) | ORAL | Status: DC

## 2015-09-17 NOTE — Telephone Encounter (Signed)
Called pt and informed her of +UTI requiring treatment. She voiced understanding and requested Rx to be sent to [facility] on [address].

## 2015-09-27 ENCOUNTER — Encounter: Payer: Self-pay | Admitting: *Deleted

## 2015-10-02 ENCOUNTER — Encounter: Payer: Self-pay | Admitting: Obstetrics & Gynecology

## 2015-10-02 ENCOUNTER — Ambulatory Visit (INDEPENDENT_AMBULATORY_CARE_PROVIDER_SITE_OTHER): Payer: Managed Care, Other (non HMO) | Admitting: Obstetrics & Gynecology

## 2015-10-02 VITALS — BP 128/74 | HR 72 | Wt 214.6 lb

## 2015-10-02 DIAGNOSIS — O34219 Maternal care for unspecified type scar from previous cesarean delivery: Secondary | ICD-10-CM

## 2015-10-02 DIAGNOSIS — G35 Multiple sclerosis: Secondary | ICD-10-CM

## 2015-10-02 DIAGNOSIS — O99352 Diseases of the nervous system complicating pregnancy, second trimester: Secondary | ICD-10-CM

## 2015-10-02 DIAGNOSIS — O0992 Supervision of high risk pregnancy, unspecified, second trimester: Secondary | ICD-10-CM

## 2015-10-02 DIAGNOSIS — Z23 Encounter for immunization: Secondary | ICD-10-CM

## 2015-10-02 DIAGNOSIS — O99350 Diseases of the nervous system complicating pregnancy, unspecified trimester: Secondary | ICD-10-CM

## 2015-10-02 DIAGNOSIS — Z8759 Personal history of other complications of pregnancy, childbirth and the puerperium: Secondary | ICD-10-CM

## 2015-10-02 LAB — CBC
HCT: 30.9 % — ABNORMAL LOW (ref 36.0–46.0)
Hemoglobin: 10.5 g/dL — ABNORMAL LOW (ref 12.0–15.0)
MCH: 30.4 pg (ref 26.0–34.0)
MCHC: 34 g/dL (ref 30.0–36.0)
MCV: 89.6 fL (ref 78.0–100.0)
MPV: 10.5 fL (ref 8.6–12.4)
PLATELETS: 219 10*3/uL (ref 150–400)
RBC: 3.45 MIL/uL — ABNORMAL LOW (ref 3.87–5.11)
RDW: 13.5 % (ref 11.5–15.5)
WBC: 5.7 10*3/uL (ref 4.0–10.5)

## 2015-10-02 LAB — POCT URINALYSIS DIP (DEVICE)
Bilirubin Urine: NEGATIVE
GLUCOSE, UA: NEGATIVE mg/dL
HGB URINE DIPSTICK: NEGATIVE
Ketones, ur: NEGATIVE mg/dL
Leukocytes, UA: NEGATIVE
NITRITE: NEGATIVE
PROTEIN: NEGATIVE mg/dL
Specific Gravity, Urine: 1.02 (ref 1.005–1.030)
UROBILINOGEN UA: 1 mg/dL (ref 0.0–1.0)
pH: 6.5 (ref 5.0–8.0)

## 2015-10-02 MED ORDER — TETANUS-DIPHTH-ACELL PERTUSSIS 5-2.5-18.5 LF-MCG/0.5 IM SUSP
0.5000 mL | Freq: Once | INTRAMUSCULAR | Status: AC
  Administered 2015-10-02: 0.5 mL via INTRAMUSCULAR

## 2015-10-02 NOTE — Progress Notes (Signed)
Subjective:  Sarah [name] is a 35 y.o. [phone] at [redacted]w[redacted]d being seen today for ongoing prenatal care.  She is currently monitored for the following issues for this high-risk pregnancy and has Anemia, iron deficiency; Urinary incontinence; Supervision of high-risk pregnancy; Previous cesarean delivery, antepartum condition or complication; Prior pregnancy with fetal demise; Multiple sclerosis ([place]); Multiple sclerosis affecting pregnancy ([place]); History of twin pregnancy in prior pregnancy; Late prenatal care affecting pregnancy in second trimester, antepartum; and History of melanoma on her problem list.  Patient reports no complaints.  Contractions: Not present. Vag. Bleeding: None.  Movement: Present. Denies leaking of fluid.   The following portions of the patient's history were reviewed and updated as appropriate: allergies, current medications, past family history, past medical history, past social history, past surgical history and problem list. Problem list updated.  Objective:   Filed Vitals:   10/02/15 0822  BP: 128/74  Pulse: 72  Weight: 214 lb 9.6 oz (97.342 kg)    Fetal Status: Fetal Heart Rate (bpm): 145 Fundal Height: 28 cm Movement: Present     General:  Alert, oriented and cooperative. Patient is in no acute distress.  Skin: Skin is warm and dry. No rash noted.   Cardiovascular: Normal heart rate noted  Respiratory: Normal respiratory effort, no problems with respiration noted  Abdomen: Soft, gravid, appropriate for gestational age. Pain/Pressure: Present     Pelvic: Vag. Bleeding: None     Cervical exam deferred        Extremities: Normal range of motion.  Edema: None  Mental Status: Normal mood and affect. Normal behavior. Normal judgment and thought content.   Urinalysis: Urine Protein: Negative Urine Glucose: Negative  Assessment and Plan:  Pregnancy: [id number] at [redacted]w[redacted]d  1. Multiple sclerosis affecting pregnancy [facility]) See problem list for MFM recommendations.   Patient to follow up with [facility] Neurology.  2. Previous cesarean delivery, antepartum condition or complication Will need repeat cesarean section at 39 weeks  3. Need for Tdap vaccination 4. Supervision of high-risk pregnancy, second trimester - Glucose Tolerance, 1 HR (50g) w/o Fasting - CBC - RPR - HIV antibody (with reflex) - Tdap (BOOSTRIX) injection 0.5 mL; Inject 0.5 mLs into the muscle once.   Preterm labor symptoms and general obstetric precautions including but not limited to vaginal bleeding, contractions, leaking of fluid and fetal movement were reviewed in detail with the patient. Please refer to After Visit Summary for other counseling recommendations.  Return in about 3 weeks (around 10/23/2015) for OB Visit.   Osborne Oman, MD

## 2015-10-02 NOTE — Patient Instructions (Signed)
AREA PEDIATRIC/FAMILY PRACTICE PHYSICIANS  [facility] [address] [place], [place] [address] Phone - [phone]   Fax - [phone]  [facility] [address] [place], [place]  [address] Phone - [phone]   Fax - [phone]  [facility] [address] [place], [place]  [address] Phone - [phone]   Fax - [phone]  [facility] [address] [place], [place]  [address] Phone - [phone]   Fax - [phone]  [facility] [address] [place], [place]  [address] Phone - [phone]   Fax - [phone]  [facility] [address] [place], [place]  [address] Phone - [phone]   Fax - [phone]  [facility] [address] [place], [place]  [address] Phone - [phone]   Fax - [phone]  [facility] [address] [place], [place]  [address] Phone - [phone]   Fax - [phone]  [facility] [address] [place], [place]  [address] Phone - [phone]   Fax - [phone] [facility] [address] [place], [place]  [address] Phone - [phone]   Fax - [phone]  [facility] [address] [place], [place]  [address] Phone - [phone]   Fax - [phone]  [facility] [address] [place], [place]  [address] Phone - [phone]   Fax - [phone]  [facility] [address] [place], [place]  [address] Phone - [phone]   Fax - [phone]  [facility] [address] [place], [place]  [address] Phone - [phone]  [facility] [address] [place], [place]  [address] Phone - [phone]   Fax - [phone]  [facility] [address] [place], [place]  [address] Phone - [phone]   Fax - [phone]  [facility] [address] [place], [place]  [address] Phone - [phone]   Fax - [phone]   [facility] [address] [place], [place]  [address] Phone - [phone]   Fax - [phone]   [facility] [address] [place], [place]  [address] Phone - [phone]   Fax - [phone]  [facility] [address] [place], [place]  [address] Phone - [phone]   Fax - [phone]  [facility] [address] [place], [place]  [address] Phone - [phone]   Fax - [phone]  [facility] [address] [place], [place]  [address] Phone - [phone]   Fax - [phone]  [facility] [address] [place], [place]  [address] Phone - [phone]   Fax - [phone] [facility] [address] [place], [place]  [address] Phone - [phone]   Fax - [phone]  [facility] [address] [place], [place]  [address] Phone - [phone]   Fax - [phone]  [facility] [address] [place],   [address] Phone - [phone]   Fax - [phone]   

## 2015-10-02 NOTE — Progress Notes (Signed)
28 week labs today and tdap.

## 2015-10-03 LAB — HIV ANTIBODY (ROUTINE TESTING W REFLEX): HIV 1&2 Ab, 4th Generation: NONREACTIVE

## 2015-10-03 LAB — RPR

## 2015-10-03 LAB — GLUCOSE TOLERANCE, 1 HOUR (50G) W/O FASTING: GLUCOSE, 1 HR, GESTATIONAL: 129 mg/dL (ref <140)

## 2015-10-23 ENCOUNTER — Ambulatory Visit (INDEPENDENT_AMBULATORY_CARE_PROVIDER_SITE_OTHER): Payer: Managed Care, Other (non HMO) | Admitting: Family

## 2015-10-23 ENCOUNTER — Encounter: Payer: Self-pay | Admitting: Family

## 2015-10-23 VITALS — BP 122/63 | HR 88 | Temp 98.1°F | Wt 214.1 lb

## 2015-10-23 DIAGNOSIS — O0993 Supervision of high risk pregnancy, unspecified, third trimester: Secondary | ICD-10-CM

## 2015-10-23 DIAGNOSIS — G35 Multiple sclerosis: Secondary | ICD-10-CM

## 2015-10-23 DIAGNOSIS — O99350 Diseases of the nervous system complicating pregnancy, unspecified trimester: Secondary | ICD-10-CM

## 2015-10-23 NOTE — Progress Notes (Signed)
Subjective:  Sarah [name] is a 35 y.o. [phone] at [redacted]w[redacted]d being seen today for ongoing prenatal care.  She is currently monitored for the following issues for this high-risk pregnancy and has Anemia, iron deficiency; Urinary incontinence; Supervision of high-risk pregnancy; Previous cesarean delivery, antepartum condition or complication; Prior pregnancy with fetal demise; Multiple sclerosis ([place]); Multiple sclerosis affecting pregnancy ([place]); History of twin pregnancy in prior pregnancy; Late prenatal care affecting pregnancy in second trimester, antepartum; and History of melanoma on her problem list.   -Patient states she has not seen her neurologist. She does not plan on seeming them during this pregnancy due to concern for another stillbirth pregnancy due to [place] medications from neurologist. She is currently not on any MS medications. Denies any symptoms or concerns about MS today.  -Has not been taking prenatal vitamin.  Patient reports no complaints.  Contractions: Not present.  .  Movement: Present. Denies leaking of fluid.   The following portions of the patient's history were reviewed and updated as appropriate: allergies, current medications, past family history, past medical history, past social history, past surgical history and problem list. Problem list updated.  Objective:   Filed Vitals:   10/23/15 0915  BP: 122/63  Pulse: 88  Temp: 98.1 F (36.7 C)  Weight: 214 lb 1.6 oz (97.115 kg)    Fetal Status: Fetal Heart Rate (bpm): 123   Movement: Present     General:  Alert, oriented and cooperative. Patient is in no acute distress.  Skin: Skin is warm and dry. No rash noted.   Cardiovascular: Normal heart rate noted  Respiratory: Normal respiratory effort, no problems with respiration noted  Abdomen: Soft, gravid, appropriate for gestational age. Pain/Pressure: Absent     Pelvic:       Cervical exam deferred        Extremities: Normal range of motion.  Edema: None  Mental  Status: Normal mood and affect. Normal behavior. Normal judgment and thought content.   Urinalysis:      Assessment and Plan:  Pregnancy: [id number] at [redacted]w[redacted]d  1. Multiple sclerosis affecting pregnancy ([place]) MS under good control currently. No concerns. Will not be visiting neurologist this pregnancy and patient aware of risk and accepts them.   2. Supervision of high-risk pregnancy, third trimester Doing well. No concerns today. Discuss scheduling of repeat C-section at next visit.    Preterm labor symptoms and general obstetric precautions including but not limited to vaginal bleeding, contractions, leaking of fluid and fetal movement were reviewed in detail with the patient. Please refer to After Visit Summary for other counseling recommendations.  Return in about 2 weeks (around 11/06/2015).   Katheren Shams, DO

## 2015-10-23 NOTE — Progress Notes (Signed)
I examined pt and agree with documentation above and resident plan of care.  Message routed to [place] to schedule repeat csection.  Growth ultrasound ordered.  Urine results not available at discharge.

## 2015-10-24 ENCOUNTER — Encounter (HOSPITAL_COMMUNITY): Payer: Self-pay | Admitting: *Deleted

## 2015-10-31 ENCOUNTER — Other Ambulatory Visit: Payer: Self-pay | Admitting: Family

## 2015-10-31 ENCOUNTER — Encounter (HOSPITAL_COMMUNITY): Payer: Self-pay

## 2015-10-31 ENCOUNTER — Ambulatory Visit (HOSPITAL_COMMUNITY)
Admission: RE | Admit: 2015-10-31 | Discharge: 2015-10-31 | Disposition: A | Discharge status: Home / Self Care | Payer: Managed Care, Other (non HMO) | Source: Ambulatory Visit | Attending: Family | Admitting: Family

## 2015-10-31 ENCOUNTER — Other Ambulatory Visit (HOSPITAL_COMMUNITY): Payer: Self-pay | Admitting: *Deleted

## 2015-10-31 VITALS — BP 101/56 | HR 72 | Wt 215.2 lb

## 2015-10-31 DIAGNOSIS — G35 Multiple sclerosis: Secondary | ICD-10-CM

## 2015-10-31 DIAGNOSIS — O09213 Supervision of pregnancy with history of pre-term labor, third trimester: Secondary | ICD-10-CM | POA: Insufficient documentation

## 2015-10-31 DIAGNOSIS — O09299 Supervision of pregnancy with other poor reproductive or obstetric history, unspecified trimester: Secondary | ICD-10-CM

## 2015-10-31 DIAGNOSIS — O34219 Maternal care for unspecified type scar from previous cesarean delivery: Secondary | ICD-10-CM

## 2015-10-31 DIAGNOSIS — O99213 Obesity complicating pregnancy, third trimester: Secondary | ICD-10-CM

## 2015-10-31 DIAGNOSIS — Z3A31 31 weeks gestation of pregnancy: Secondary | ICD-10-CM

## 2015-10-31 DIAGNOSIS — O09293 Supervision of pregnancy with other poor reproductive or obstetric history, third trimester: Secondary | ICD-10-CM

## 2015-10-31 DIAGNOSIS — O99350 Diseases of the nervous system complicating pregnancy, unspecified trimester: Principal | ICD-10-CM

## 2015-10-31 DIAGNOSIS — O0993 Supervision of high risk pregnancy, unspecified, third trimester: Secondary | ICD-10-CM

## 2015-10-31 DIAGNOSIS — Z8759 Personal history of other complications of pregnancy, childbirth and the puerperium: Secondary | ICD-10-CM

## 2015-10-31 DIAGNOSIS — O09893 Supervision of other high risk pregnancies, third trimester: Secondary | ICD-10-CM

## 2015-10-31 DIAGNOSIS — G35D Multiple sclerosis, unspecified: Secondary | ICD-10-CM

## 2015-10-31 DIAGNOSIS — O99353 Diseases of the nervous system complicating pregnancy, third trimester: Secondary | ICD-10-CM | POA: Insufficient documentation

## 2015-11-04 ENCOUNTER — Encounter: Payer: Self-pay | Admitting: *Deleted

## 2015-11-06 ENCOUNTER — Ambulatory Visit (INDEPENDENT_AMBULATORY_CARE_PROVIDER_SITE_OTHER): Payer: Managed Care, Other (non HMO) | Admitting: Obstetrics & Gynecology

## 2015-11-06 VITALS — BP 104/67 | HR 87 | Wt 216.8 lb

## 2015-11-06 DIAGNOSIS — O09299 Supervision of pregnancy with other poor reproductive or obstetric history, unspecified trimester: Secondary | ICD-10-CM | POA: Diagnosis not present

## 2015-11-06 DIAGNOSIS — Z36 Encounter for antenatal screening of mother: Secondary | ICD-10-CM | POA: Diagnosis not present

## 2015-11-06 DIAGNOSIS — G35 Multiple sclerosis: Secondary | ICD-10-CM

## 2015-11-06 DIAGNOSIS — O34219 Maternal care for unspecified type scar from previous cesarean delivery: Secondary | ICD-10-CM

## 2015-11-06 DIAGNOSIS — O99350 Diseases of the nervous system complicating pregnancy, unspecified trimester: Secondary | ICD-10-CM

## 2015-11-06 DIAGNOSIS — O0993 Supervision of high risk pregnancy, unspecified, third trimester: Secondary | ICD-10-CM

## 2015-11-06 LAB — POCT URINALYSIS DIP (DEVICE)
BILIRUBIN URINE: NEGATIVE
Glucose, UA: NEGATIVE mg/dL
HGB URINE DIPSTICK: NEGATIVE
Ketones, ur: NEGATIVE mg/dL
Leukocytes, UA: NEGATIVE
NITRITE: NEGATIVE
PH: 6 (ref 5.0–8.0)
PROTEIN: NEGATIVE mg/dL
Specific Gravity, Urine: 1.02 (ref 1.005–1.030)
UROBILINOGEN UA: 1 mg/dL (ref 0.0–1.0)

## 2015-11-06 NOTE — Patient Instructions (Signed)
Cesarean section scheduled 12/20/15 at 11:30 am. Arrive to hospital at 10 am. Nothing to eat of drink after midnight before surgery.  Return to clinic for any scheduled appointments or obstetric concerns, or go to [facility] for evaluation.

## 2015-11-06 NOTE — Progress Notes (Signed)
Subjective:  Sarah [name] is a 35 y.o. [phone] at [redacted]w[redacted]d being seen today for ongoing prenatal care.  She is currently monitored for the following issues for this high-risk pregnancy and has Anemia, iron deficiency; Urinary incontinence; Supervision of high-risk pregnancy; Previous cesarean delivery, antepartum condition or complication; Prior pregnancy with fetal demise; Multiple sclerosis ([place]); Multiple sclerosis affecting pregnancy ([place]); History of twin pregnancy in prior pregnancy; Late prenatal care affecting pregnancy in second trimester, antepartum; and History of melanoma on her problem list.  Patient reports no complaints.  Contractions: Irregular. Vag. Bleeding: None.  Movement: Present. Denies leaking of fluid.   The following portions of the patient's history were reviewed and updated as appropriate: allergies, current medications, past family history, past medical history, past social history, past surgical history and problem list. Problem list updated.  Objective:   Filed Vitals:   11/06/15 0835  BP: 104/67  Pulse: 87  Weight: 216 lb 12.8 oz (98.34 kg)    Fetal Status: Fetal Heart Rate (bpm): NST Fundal Height: 34 cm Movement: Present     General:  Alert, oriented and cooperative. Patient is in no acute distress.  Skin: Skin is warm and dry. No rash noted.   Cardiovascular: Normal heart rate noted  Respiratory: Normal respiratory effort, no problems with respiration noted  Abdomen: Soft, gravid, appropriate for gestational age. Pain/Pressure: Present     Pelvic: Vag. Bleeding: None    Cervical exam deferred        Extremities: Normal range of motion.  Edema: Mild pitting, slight indentation  Mental Status: Normal mood and affect. Normal behavior. Normal judgment and thought content.   Urinalysis: Urine Protein: Negative Urine Glucose: Negative NST performed today was reviewed and was found to be reactive.  AFI was also normal.   Assessment and Plan:  Pregnancy: [id number]  at [redacted]w[redacted]d  1. Prior pregnancy with fetal demise Stable testing today.  Continue recommended antenatal testing and prenatal care. - Amniotic fluid index with NST  2. Previous cesarean delivery, antepartum condition or complication Scheduled on 12/20/15; patient informed  3. Multiple sclerosis affecting pregnancy ([place]) Stable, followed by Neurology   4. Supervision of high-risk pregnancy, third trimester Preterm labor symptoms and general obstetric precautions including but not limited to vaginal bleeding, contractions, leaking of fluid and fetal movement were reviewed in detail with the patient. Please refer to After Visit Summary for other counseling recommendations.  Return for 2x/week testing and appointments as scheduled.   Osborne Oman, MD

## 2015-11-07 ENCOUNTER — Ambulatory Visit (HOSPITAL_COMMUNITY): Payer: Managed Care, Other (non HMO)

## 2015-11-10 ENCOUNTER — Ambulatory Visit (INDEPENDENT_AMBULATORY_CARE_PROVIDER_SITE_OTHER): Payer: Managed Care, Other (non HMO) | Admitting: *Deleted

## 2015-11-10 VITALS — BP 112/69 | HR 84

## 2015-11-10 DIAGNOSIS — O09299 Supervision of pregnancy with other poor reproductive or obstetric history, unspecified trimester: Secondary | ICD-10-CM

## 2015-11-10 DIAGNOSIS — O09293 Supervision of pregnancy with other poor reproductive or obstetric history, third trimester: Secondary | ICD-10-CM

## 2015-11-10 NOTE — Progress Notes (Signed)
nst reactive

## 2015-11-13 ENCOUNTER — Ambulatory Visit (INDEPENDENT_AMBULATORY_CARE_PROVIDER_SITE_OTHER): Payer: Managed Care, Other (non HMO) | Admitting: Obstetrics & Gynecology

## 2015-11-13 VITALS — BP 102/66 | HR 99 | Wt 217.7 lb

## 2015-11-13 DIAGNOSIS — D509 Iron deficiency anemia, unspecified: Secondary | ICD-10-CM

## 2015-11-13 DIAGNOSIS — O0993 Supervision of high risk pregnancy, unspecified, third trimester: Secondary | ICD-10-CM

## 2015-11-13 DIAGNOSIS — Z36 Encounter for antenatal screening of mother: Secondary | ICD-10-CM

## 2015-11-13 DIAGNOSIS — O99353 Diseases of the nervous system complicating pregnancy, third trimester: Secondary | ICD-10-CM

## 2015-11-13 DIAGNOSIS — O09293 Supervision of pregnancy with other poor reproductive or obstetric history, third trimester: Secondary | ICD-10-CM | POA: Diagnosis not present

## 2015-11-13 DIAGNOSIS — O09299 Supervision of pregnancy with other poor reproductive or obstetric history, unspecified trimester: Secondary | ICD-10-CM

## 2015-11-13 DIAGNOSIS — O99013 Anemia complicating pregnancy, third trimester: Secondary | ICD-10-CM

## 2015-11-13 DIAGNOSIS — G35 Multiple sclerosis: Secondary | ICD-10-CM

## 2015-11-13 LAB — POCT URINALYSIS DIP (DEVICE)
Glucose, UA: NEGATIVE mg/dL
HGB URINE DIPSTICK: NEGATIVE
Ketones, ur: NEGATIVE mg/dL
Leukocytes, UA: NEGATIVE
NITRITE: NEGATIVE
PH: 6 (ref 5.0–8.0)
PROTEIN: NEGATIVE mg/dL
UROBILINOGEN UA: 2 mg/dL — AB (ref 0.0–1.0)

## 2015-11-13 NOTE — Progress Notes (Signed)
Breastfeeding tip of the week reviewed. 

## 2015-11-13 NOTE — Patient Instructions (Signed)

## 2015-11-13 NOTE — Progress Notes (Signed)
Subjective:  Sarah [name] is a 35 y.o. [phone] at [redacted]w[redacted]d being seen today for ongoing prenatal care.  She is currently monitored for the following issues for this high-risk pregnancy and has Anemia, iron deficiency; Urinary incontinence; Supervision of high-risk pregnancy; Previous cesarean delivery, antepartum condition or complication; Prior pregnancy with fetal demise; Multiple sclerosis ([place]); Multiple sclerosis affecting pregnancy ([place]); History of twin pregnancy in prior pregnancy; Late prenatal care affecting pregnancy in second trimester, antepartum; and History of melanoma on her problem list.  Patient reports no complaints.  Contractions: Irregular. Vag. Bleeding: None.  Movement: Present. Denies leaking of fluid.   The following portions of the patient's history were reviewed and updated as appropriate: allergies, current medications, past family history, past medical history, past social history, past surgical history and problem list. Problem list updated.  Objective:   Filed Vitals:   11/13/15 0854  BP: 102/66  Pulse: 99  Weight: 217 lb 11.2 oz (98.748 kg)    Fetal Status: Fetal Heart Rate (bpm): NST   Movement: Present     General:  Alert, oriented and cooperative. Patient is in no acute distress.  Skin: Skin is warm and dry. No rash noted.   Cardiovascular: Normal heart rate noted  Respiratory: Normal respiratory effort, no problems with respiration noted  Abdomen: Soft, gravid, appropriate for gestational age. Pain/Pressure: Present     Pelvic: Vag. Bleeding: None     Cervical exam deferred        Extremities: Normal range of motion.  Edema: Trace  Mental Status: Normal mood and affect. Normal behavior. Normal judgment and thought content.   Urinalysis:      Assessment and Plan:  Pregnancy: [id number] at [redacted]w[redacted]d  1. Prior pregnancy with fetal demise 22 weeks, NST reactive today  2. Supervision of high-risk pregnancy, third trimester Growth [place] schdeuled  Preterm  labor symptoms and general obstetric precautions including but not limited to vaginal bleeding, contractions, leaking of fluid and fetal movement were reviewed in detail with the patient. Please refer to After Visit Summary for other counseling recommendations.  Return in about 4 days (around 11/17/2015) for 2x/wk as scheduled.   Woodroe Mode, MD

## 2015-11-14 ENCOUNTER — Ambulatory Visit (HOSPITAL_COMMUNITY): Payer: Managed Care, Other (non HMO)

## 2015-11-17 ENCOUNTER — Ambulatory Visit (INDEPENDENT_AMBULATORY_CARE_PROVIDER_SITE_OTHER): Payer: Managed Care, Other (non HMO) | Admitting: *Deleted

## 2015-11-17 VITALS — BP 117/62 | HR 87

## 2015-11-17 DIAGNOSIS — O09299 Supervision of pregnancy with other poor reproductive or obstetric history, unspecified trimester: Secondary | ICD-10-CM | POA: Diagnosis not present

## 2015-11-17 NOTE — Progress Notes (Signed)
NST reactive.

## 2015-11-20 ENCOUNTER — Ambulatory Visit (INDEPENDENT_AMBULATORY_CARE_PROVIDER_SITE_OTHER): Payer: Managed Care, Other (non HMO) | Admitting: Family Medicine

## 2015-11-20 VITALS — BP 110/67 | HR 75 | Wt 213.3 lb

## 2015-11-20 DIAGNOSIS — O09293 Supervision of pregnancy with other poor reproductive or obstetric history, third trimester: Secondary | ICD-10-CM

## 2015-11-20 DIAGNOSIS — O09299 Supervision of pregnancy with other poor reproductive or obstetric history, unspecified trimester: Secondary | ICD-10-CM

## 2015-11-20 DIAGNOSIS — O0993 Supervision of high risk pregnancy, unspecified, third trimester: Secondary | ICD-10-CM

## 2015-11-20 DIAGNOSIS — Z36 Encounter for antenatal screening of mother: Secondary | ICD-10-CM | POA: Diagnosis not present

## 2015-11-20 DIAGNOSIS — O34219 Maternal care for unspecified type scar from previous cesarean delivery: Secondary | ICD-10-CM

## 2015-11-20 LAB — POCT URINALYSIS DIP (DEVICE)
GLUCOSE, UA: NEGATIVE mg/dL
Hgb urine dipstick: NEGATIVE
Ketones, ur: NEGATIVE mg/dL
LEUKOCYTES UA: NEGATIVE
NITRITE: NEGATIVE
Protein, ur: 30 mg/dL — AB
UROBILINOGEN UA: 4 mg/dL — AB (ref 0.0–1.0)
pH: 6.5 (ref 5.0–8.0)

## 2015-11-20 NOTE — Progress Notes (Signed)
Subjective:  Sarah [name] is a 34 y.o. [phone] at [redacted]w[redacted]d being seen today for ongoing prenatal care.  She is currently monitored for the following issues for this high-risk pregnancy and has Anemia, iron deficiency; Urinary incontinence; Supervision of high-risk pregnancy; Previous cesarean delivery, antepartum condition or complication; Prior pregnancy with fetal demise; Multiple sclerosis ([place]); Multiple sclerosis affecting pregnancy ([place]); History of twin pregnancy in prior pregnancy; Late prenatal care affecting pregnancy in second trimester, antepartum; and History of melanoma on her problem list.  Patient reports occasional contractions.  Contractions: Irregular. Vag. Bleeding: None.  Movement: Present. Denies leaking of fluid.   The following portions of the patient's history were reviewed and updated as appropriate: allergies, current medications, past family history, past medical history, past social history, past surgical history and problem list. Problem list updated.  Objective:   Filed Vitals:   11/20/15 0747  BP: 110/67  Pulse: 75  Weight: 213 lb 4.8 oz (96.752 kg)    Fetal Status: Fetal Heart Rate (bpm): NST   Movement: Present     General:  Alert, oriented and cooperative. Patient is in no acute distress.  Skin: Skin is warm and dry. No rash noted.   Cardiovascular: Normal heart rate noted  Respiratory: Normal respiratory effort, no problems with respiration noted  Abdomen: Soft, gravid, appropriate for gestational age. Pain/Pressure: Present     Pelvic: Vag. Bleeding: None     Cervical exam deferred        Extremities: Normal range of motion.  Edema: None  Mental Status: Normal mood and affect. Normal behavior. Normal judgment and thought content.   Urinalysis: Urine Protein: 1+ Urine Glucose: Negative  Assessment and Plan:  Pregnancy: [id number] at [redacted]w[redacted]d  1. Supervision of high-risk pregnancy, third trimester Wants BTL - will sign paperwork  2. Prior pregnancy with  fetal demise NST reactive.  Continue twice weekly - Amniotic fluid index with NST  3. Previous cesarean delivery, antepartum condition or complication Plans on repeat cesarean  Preterm labor symptoms and general obstetric precautions including but not limited to vaginal bleeding, contractions, leaking of fluid and fetal movement were reviewed in detail with the patient. Please refer to After Visit Summary for other counseling recommendations.  No Follow-up on file.   Truett Mainland, DO

## 2015-11-20 NOTE — Patient Instructions (Signed)

## 2015-11-21 ENCOUNTER — Ambulatory Visit (HOSPITAL_COMMUNITY): Payer: Managed Care, Other (non HMO)

## 2015-11-24 ENCOUNTER — Ambulatory Visit (INDEPENDENT_AMBULATORY_CARE_PROVIDER_SITE_OTHER): Payer: Managed Care, Other (non HMO) | Admitting: *Deleted

## 2015-11-24 VITALS — BP 125/66 | HR 78

## 2015-11-24 DIAGNOSIS — O09292 Supervision of pregnancy with other poor reproductive or obstetric history, second trimester: Secondary | ICD-10-CM | POA: Diagnosis not present

## 2015-11-24 DIAGNOSIS — O99350 Diseases of the nervous system complicating pregnancy, unspecified trimester: Secondary | ICD-10-CM

## 2015-11-24 DIAGNOSIS — G35 Multiple sclerosis: Secondary | ICD-10-CM

## 2015-11-24 DIAGNOSIS — O99352 Diseases of the nervous system complicating pregnancy, second trimester: Secondary | ICD-10-CM

## 2015-11-24 DIAGNOSIS — O0932 Supervision of pregnancy with insufficient antenatal care, second trimester: Secondary | ICD-10-CM

## 2015-11-24 DIAGNOSIS — O09299 Supervision of pregnancy with other poor reproductive or obstetric history, unspecified trimester: Secondary | ICD-10-CM

## 2015-11-24 NOTE — Progress Notes (Signed)
Patient reports menstrual like cramps starting last night and a lot of mucous yellow d/c.

## 2015-11-24 NOTE — Progress Notes (Signed)
NST reactive.

## 2015-11-27 ENCOUNTER — Ambulatory Visit (INDEPENDENT_AMBULATORY_CARE_PROVIDER_SITE_OTHER): Payer: Managed Care, Other (non HMO) | Admitting: Family

## 2015-11-27 ENCOUNTER — Encounter (HOSPITAL_COMMUNITY): Payer: Self-pay

## 2015-11-27 ENCOUNTER — Ambulatory Visit (HOSPITAL_COMMUNITY)
Admission: RE | Admit: 2015-11-27 | Discharge: 2015-11-27 | Disposition: A | Discharge status: Home / Self Care | Payer: Managed Care, Other (non HMO) | Source: Ambulatory Visit | Attending: Obstetrics and Gynecology | Admitting: Obstetrics and Gynecology

## 2015-11-27 VITALS — BP 113/67 | HR 85 | Wt 215.7 lb

## 2015-11-27 DIAGNOSIS — O34219 Maternal care for unspecified type scar from previous cesarean delivery: Secondary | ICD-10-CM | POA: Insufficient documentation

## 2015-11-27 DIAGNOSIS — Z113 Encounter for screening for infections with a predominantly sexual mode of transmission: Secondary | ICD-10-CM | POA: Diagnosis not present

## 2015-11-27 DIAGNOSIS — O09213 Supervision of pregnancy with history of pre-term labor, third trimester: Secondary | ICD-10-CM | POA: Insufficient documentation

## 2015-11-27 DIAGNOSIS — G35 Multiple sclerosis: Secondary | ICD-10-CM | POA: Diagnosis not present

## 2015-11-27 DIAGNOSIS — O09293 Supervision of pregnancy with other poor reproductive or obstetric history, third trimester: Secondary | ICD-10-CM

## 2015-11-27 DIAGNOSIS — O0993 Supervision of high risk pregnancy, unspecified, third trimester: Secondary | ICD-10-CM

## 2015-11-27 DIAGNOSIS — O99213 Obesity complicating pregnancy, third trimester: Secondary | ICD-10-CM | POA: Insufficient documentation

## 2015-11-27 DIAGNOSIS — O09299 Supervision of pregnancy with other poor reproductive or obstetric history, unspecified trimester: Secondary | ICD-10-CM

## 2015-11-27 DIAGNOSIS — O99350 Diseases of the nervous system complicating pregnancy, unspecified trimester: Secondary | ICD-10-CM | POA: Diagnosis not present

## 2015-11-27 DIAGNOSIS — Z3A35 35 weeks gestation of pregnancy: Secondary | ICD-10-CM | POA: Insufficient documentation

## 2015-11-27 LAB — POCT URINALYSIS DIP (DEVICE)
Bilirubin Urine: NEGATIVE
Glucose, UA: NEGATIVE mg/dL
HGB URINE DIPSTICK: NEGATIVE
Ketones, ur: NEGATIVE mg/dL
Leukocytes, UA: NEGATIVE
Nitrite: NEGATIVE
PROTEIN: NEGATIVE mg/dL
Specific Gravity, Urine: 1.015 (ref 1.005–1.030)
UROBILINOGEN UA: 2 mg/dL — AB (ref 0.0–1.0)
pH: 6.5 (ref 5.0–8.0)

## 2015-11-27 LAB — OB RESULTS CONSOLE GBS: GBS: NEGATIVE

## 2015-11-27 NOTE — Progress Notes (Signed)
US for growth done today 

## 2015-11-27 NOTE — Progress Notes (Signed)
Subjective:  Sarah [name] is a 35 y.o. [phone] at [redacted]w[redacted]d being seen today for ongoing prenatal care.  She is currently monitored for the following issues for this high-risk pregnancy and has Anemia, iron deficiency; Urinary incontinence; Supervision of high-risk pregnancy; Previous cesarean delivery, antepartum condition or complication; Prior pregnancy with fetal demise; Multiple sclerosis ([place]); Multiple sclerosis affecting pregnancy ([place]); History of twin pregnancy in prior pregnancy; Late prenatal care affecting pregnancy in second trimester, antepartum; and History of melanoma on her problem list.  Patient reports no complaints.  Contractions: Irregular. Vag. Bleeding: None.  Movement: Present. Denies leaking of fluid.   The following portions of the patient's history were reviewed and updated as appropriate: allergies, current medications, past family history, past medical history, past social history, past surgical history and problem list. Problem list updated.  Objective:   Filed Vitals:   11/27/15 0951  BP: 113/67  Pulse: 85  Weight: 215 lb 11.2 oz (97.841 kg)    Fetal Status: Fetal Heart Rate (bpm): NST-R Fundal Height: 36 cm Movement: Present     General:  Alert, oriented and cooperative. Patient is in no acute distress.  Skin: Skin is warm and dry. No rash noted.   Cardiovascular: Normal heart rate noted  Respiratory: Normal respiratory effort, no problems with respiration noted  Abdomen: Soft, gravid, appropriate for gestational age. Pain/Pressure: Present     Pelvic: Vag. Bleeding: None     Cervical exam performed Dilation: Closed      Extremities: Normal range of motion.  Edema: Trace  Mental Status: Normal mood and affect. Normal behavior. Normal judgment and thought content.   Urinalysis:      Assessment and Plan:  Pregnancy: [id number] at [redacted]w[redacted]d  1. Prior pregnancy with fetal demise - NST reactive  2. Supervision of high-risk pregnancy, third trimester - Culture,  beta strep (group b only) - GC/Chlamydia probe amp ([place])not at [facility] - Growth [place] today w/BPP:  75%ile, BPP 8/8  3. Previous cesarean delivery, antepartum condition or complication - Repeat scheduled 12/20/15  Preterm labor symptoms and general obstetric precautions including but not limited to vaginal bleeding, contractions, leaking of fluid and fetal movement were reviewed in detail with the patient. Please refer to After Visit Summary for other counseling recommendations.  Return in about 4 days (around 12/01/2015) for 2x/wk as scheduled.   Venia Carbon Michiel Cowboy, CNM

## 2015-11-28 ENCOUNTER — Ambulatory Visit (HOSPITAL_COMMUNITY): Payer: Managed Care, Other (non HMO)

## 2015-11-28 LAB — GC/CHLAMYDIA PROBE AMP (~~LOC~~) NOT AT ARMC
CHLAMYDIA, DNA PROBE: NEGATIVE
NEISSERIA GONORRHEA: NEGATIVE

## 2015-11-29 LAB — CULTURE, BETA STREP (GROUP B ONLY)

## 2015-12-01 ENCOUNTER — Ambulatory Visit (INDEPENDENT_AMBULATORY_CARE_PROVIDER_SITE_OTHER): Payer: Managed Care, Other (non HMO) | Admitting: *Deleted

## 2015-12-01 VITALS — BP 119/64 | HR 77

## 2015-12-01 DIAGNOSIS — O09299 Supervision of pregnancy with other poor reproductive or obstetric history, unspecified trimester: Secondary | ICD-10-CM | POA: Diagnosis not present

## 2015-12-01 NOTE — Progress Notes (Signed)
NST reactive.

## 2015-12-04 ENCOUNTER — Ambulatory Visit (INDEPENDENT_AMBULATORY_CARE_PROVIDER_SITE_OTHER): Payer: Managed Care, Other (non HMO) | Admitting: Family Medicine

## 2015-12-04 VITALS — BP 114/69 | HR 72 | Wt 217.1 lb

## 2015-12-04 DIAGNOSIS — O0993 Supervision of high risk pregnancy, unspecified, third trimester: Secondary | ICD-10-CM

## 2015-12-04 DIAGNOSIS — O09293 Supervision of pregnancy with other poor reproductive or obstetric history, third trimester: Secondary | ICD-10-CM | POA: Diagnosis not present

## 2015-12-04 DIAGNOSIS — Z36 Encounter for antenatal screening of mother: Secondary | ICD-10-CM

## 2015-12-04 DIAGNOSIS — O34219 Maternal care for unspecified type scar from previous cesarean delivery: Secondary | ICD-10-CM

## 2015-12-04 DIAGNOSIS — O09299 Supervision of pregnancy with other poor reproductive or obstetric history, unspecified trimester: Secondary | ICD-10-CM

## 2015-12-04 LAB — POCT URINALYSIS DIP (DEVICE)
Bilirubin Urine: NEGATIVE
GLUCOSE, UA: NEGATIVE mg/dL
Hgb urine dipstick: NEGATIVE
Ketones, ur: NEGATIVE mg/dL
Leukocytes, UA: NEGATIVE
Nitrite: NEGATIVE
PROTEIN: NEGATIVE mg/dL
Specific Gravity, Urine: 1.01 (ref 1.005–1.030)
UROBILINOGEN UA: 1 mg/dL (ref 0.0–1.0)
pH: 6.5 (ref 5.0–8.0)

## 2015-12-04 NOTE — Progress Notes (Signed)
Pt reports swelling of labia causing discomfort

## 2015-12-04 NOTE — Patient Instructions (Signed)

## 2015-12-04 NOTE — Progress Notes (Signed)
Subjective:  Sarah [name] is a 35 y.o. [phone] at [redacted]w[redacted]d being seen today for ongoing prenatal care.  She is currently monitored for the following issues for this high-risk pregnancy and has Anemia, iron deficiency; Urinary incontinence; Supervision of high-risk pregnancy; Previous cesarean delivery, antepartum condition or complication; Prior pregnancy with fetal demise; Multiple sclerosis ([place]); Multiple sclerosis affecting pregnancy ([place]); History of twin pregnancy in prior pregnancy; Late prenatal care affecting pregnancy in second trimester, antepartum; and History of melanoma on her problem list.  Patient reports no complaints.  Contractions: Irregular. Vag. Bleeding: None.  Movement: Present. Denies leaking of fluid.   The following portions of the patient's history were reviewed and updated as appropriate: allergies, current medications, past family history, past medical history, past social history, past surgical history and problem list. Problem list updated.  Objective:   Filed Vitals:   12/04/15 0752  BP: 114/69  Pulse: 72  Weight: 217 lb 1.6 oz (98.476 kg)    Fetal Status: Fetal Heart Rate (bpm): NST   Movement: Present     General:  Alert, oriented and cooperative. Patient is in no acute distress.  Skin: Skin is warm and dry. No rash noted.   Cardiovascular: Normal heart rate noted  Respiratory: Normal respiratory effort, no problems with respiration noted  Abdomen: Soft, gravid, appropriate for gestational age. Pain/Pressure: Present     Pelvic: Vag. Bleeding: None     Cervical exam deferred        Extremities: Normal range of motion.  Edema: Trace  Mental Status: Normal mood and affect. Normal behavior. Normal judgment and thought content.   Urinalysis:      Assessment and Plan:  Pregnancy: [id number] at [redacted]w[redacted]d  1. Supervision of high-risk pregnancy, third trimester Normal FHT  2. Prior pregnancy with fetal demise NST reactive, AFI: 13 - Amniotic fluid index with  NST  3. Previous cesarean delivery, antepartum condition or complication Scheduled for repeat  Preterm labor symptoms and general obstetric precautions including but not limited to vaginal bleeding, contractions, leaking of fluid and fetal movement were reviewed in detail with the patient. Please refer to After Visit Summary for other counseling recommendations.  No Follow-up on file.   Truett Mainland, DO

## 2015-12-05 ENCOUNTER — Ambulatory Visit (HOSPITAL_COMMUNITY): Payer: Managed Care, Other (non HMO)

## 2015-12-08 ENCOUNTER — Ambulatory Visit (INDEPENDENT_AMBULATORY_CARE_PROVIDER_SITE_OTHER): Payer: Managed Care, Other (non HMO) | Admitting: General Practice

## 2015-12-08 ENCOUNTER — Other Ambulatory Visit (HOSPITAL_COMMUNITY): Payer: Self-pay | Admitting: *Deleted

## 2015-12-08 VITALS — BP 118/72 | HR 68

## 2015-12-08 DIAGNOSIS — O09299 Supervision of pregnancy with other poor reproductive or obstetric history, unspecified trimester: Secondary | ICD-10-CM

## 2015-12-08 DIAGNOSIS — O09293 Supervision of pregnancy with other poor reproductive or obstetric history, third trimester: Secondary | ICD-10-CM

## 2015-12-08 NOTE — Progress Notes (Signed)
Patient here for NST only today.

## 2015-12-08 NOTE — Progress Notes (Signed)
NST reactive.

## 2015-12-11 ENCOUNTER — Ambulatory Visit (INDEPENDENT_AMBULATORY_CARE_PROVIDER_SITE_OTHER): Payer: Managed Care, Other (non HMO) | Admitting: Obstetrics & Gynecology

## 2015-12-11 ENCOUNTER — Other Ambulatory Visit (HOSPITAL_COMMUNITY): Payer: Self-pay | Admitting: Obstetrics and Gynecology

## 2015-12-11 ENCOUNTER — Ambulatory Visit (HOSPITAL_COMMUNITY)
Admission: RE | Admit: 2015-12-11 | Discharge: 2015-12-11 | Disposition: A | Discharge status: Home / Self Care | Payer: Managed Care, Other (non HMO) | Source: Ambulatory Visit | Attending: Family | Admitting: Family

## 2015-12-11 ENCOUNTER — Encounter (HOSPITAL_COMMUNITY): Payer: Self-pay

## 2015-12-11 VITALS — BP 120/73 | HR 70 | Wt 217.0 lb

## 2015-12-11 VITALS — BP 115/65 | HR 64 | Wt 217.4 lb

## 2015-12-11 DIAGNOSIS — O99213 Obesity complicating pregnancy, third trimester: Secondary | ICD-10-CM | POA: Diagnosis not present

## 2015-12-11 DIAGNOSIS — Z3A37 37 weeks gestation of pregnancy: Secondary | ICD-10-CM | POA: Diagnosis not present

## 2015-12-11 DIAGNOSIS — G35 Multiple sclerosis: Secondary | ICD-10-CM | POA: Insufficient documentation

## 2015-12-11 DIAGNOSIS — O09299 Supervision of pregnancy with other poor reproductive or obstetric history, unspecified trimester: Secondary | ICD-10-CM

## 2015-12-11 DIAGNOSIS — O0993 Supervision of high risk pregnancy, unspecified, third trimester: Secondary | ICD-10-CM

## 2015-12-11 DIAGNOSIS — O34219 Maternal care for unspecified type scar from previous cesarean delivery: Secondary | ICD-10-CM

## 2015-12-11 DIAGNOSIS — O09213 Supervision of pregnancy with history of pre-term labor, third trimester: Secondary | ICD-10-CM | POA: Insufficient documentation

## 2015-12-11 DIAGNOSIS — O09293 Supervision of pregnancy with other poor reproductive or obstetric history, third trimester: Secondary | ICD-10-CM

## 2015-12-11 DIAGNOSIS — O99350 Diseases of the nervous system complicating pregnancy, unspecified trimester: Secondary | ICD-10-CM | POA: Insufficient documentation

## 2015-12-11 DIAGNOSIS — G35D Multiple sclerosis, unspecified: Secondary | ICD-10-CM

## 2015-12-11 DIAGNOSIS — O09893 Supervision of other high risk pregnancies, third trimester: Secondary | ICD-10-CM

## 2015-12-11 DIAGNOSIS — Z8759 Personal history of other complications of pregnancy, childbirth and the puerperium: Secondary | ICD-10-CM

## 2015-12-11 LAB — POCT URINALYSIS DIP (DEVICE)
Bilirubin Urine: NEGATIVE
GLUCOSE, UA: NEGATIVE mg/dL
HGB URINE DIPSTICK: NEGATIVE
Ketones, ur: NEGATIVE mg/dL
LEUKOCYTES UA: NEGATIVE
NITRITE: NEGATIVE
PROTEIN: NEGATIVE mg/dL
Specific Gravity, Urine: 1.01 (ref 1.005–1.030)
UROBILINOGEN UA: 1 mg/dL (ref 0.0–1.0)
pH: 6 (ref 5.0–8.0)

## 2015-12-11 NOTE — Patient Instructions (Signed)
Cesarean Delivery, Care After  Refer to this sheet in the next few weeks. These instructions provide you with information on caring for yourself after your procedure. Your health care provider may also give you specific instructions. Your treatment has been planned according to current medical practices, but problems sometimes occur. Call your health care provider if you have any problems or questions after you go home.  HOME CARE INSTRUCTIONS   Only take over-the-counter or prescription medications as directed by your health care provider.   Do not drink alcohol, especially if you are breastfeeding or taking medication to relieve pain.   Do not chew or smoke tobacco.   Continue to use good perineal care. Good perineal care includes:    Wiping your perineum from front to back.    Keeping your perineum clean.   Check your surgical cut (incision) daily for increased redness, drainage, swelling, or separation of skin.   Clean your incision gently with soap and water every day, and then pat it dry. If your health care provider says it is okay, leave the incision uncovered. Use a bandage (dressing) if the incision is draining fluid or appears irritated. If the adhesive strips across the incision do not fall off within 7 days, carefully peel them off.   Hug a pillow when coughing or sneezing until your incision is healed. This helps to relieve pain.   Do not use tampons or douche until your health care provider says it is okay.   Shower, wash your hair, and take tub baths as directed by your health care provider.   Wear a well-fitting bra that provides breast support.   Limit wearing support panties or control-top hose.   Drink enough fluids to keep your urine clear or pale yellow.   Eat high-fiber foods such as whole grain cereals and breads, brown rice, beans, and fresh fruits and vegetables every day. These foods may help prevent or relieve constipation.   Resume activities such as climbing stairs,  driving, lifting, exercising, or traveling as directed by your health care provider.   Talk to your health care provider about resuming sexual activities. This is dependent upon your risk of infection, your rate of healing, and your comfort and desire to resume sexual activity.   Try to have someone help you with your household activities and your newborn for at least a few days after you leave the hospital.   Rest as much as possible. Try to rest or take a nap when your newborn is sleeping.   Increase your activities gradually.   Keep all of your scheduled postpartum appointments. It is very important to keep your scheduled follow-up appointments. At these appointments, your health care provider will be checking to make sure that you are healing physically and emotionally.  SEEK MEDICAL CARE IF:    You are passing large clots from your vagina. Save any clots to show your health care provider.   You have a foul smelling discharge from your vagina.   You have trouble urinating.   You are urinating frequently.   You have pain when you urinate.   You have a change in your bowel movements.   You have increasing redness, pain, or swelling near your incision.   You have pus draining from your incision.   Your incision is separating.   You have painful, hard, or reddened breasts.   You have a severe headache.   You have blurred vision or see spots.   You feel sad   or depressed.   You have thoughts of hurting yourself or your newborn.   You have questions about your care, the care of your newborn, or medications.   You are dizzy or light-headed.   You have a rash.   You have pain, redness, or swelling at the site of the removed intravenous access (IV) tube.   You have nausea or vomiting.   You stopped breastfeeding and have not had a menstrual period within 12 weeks of stopping.   You are not breastfeeding and have not had a menstrual period within 12 weeks of delivery.   You have a fever.  SEEK  IMMEDIATE MEDICAL CARE IF:   You have persistent pain.   You have chest pain.   You have shortness of breath.   You faint.   You have leg pain.   You have stomach pain.   Your vaginal bleeding saturates 2 or more sanitary pads in 1 hour.  MAKE SURE YOU:    Understand these instructions.   Will watch your condition.   Will get help right away if you are not doing well or get worse.     This information is not intended to replace advice given to you by your health care provider. Make sure you discuss any questions you have with your health care provider.     Document Released: 04/17/2002 Document Revised: 08/16/2014 Document Reviewed: 03/22/2012  Elsevier Interactive Patient Education 2016 [employer]

## 2015-12-11 NOTE — Progress Notes (Signed)
Subjective:NST done  Sarah [name] is a 35 y.o. [phone] at [redacted]w[redacted]d being seen today for ongoing prenatal care.  She is currently monitored for the following issues for this high-risk pregnancy and has Anemia, iron deficiency; Urinary incontinence; Supervision of high-risk pregnancy; Previous cesarean delivery, antepartum condition or complication; Prior pregnancy with fetal demise; Multiple sclerosis ([place]); Multiple sclerosis affecting pregnancy ([place]); History of twin pregnancy in prior pregnancy; Late prenatal care affecting pregnancy in second trimester, antepartum; and History of melanoma on her problem list.  Patient reports no complaints.  Contractions: Irregular. Vag. Bleeding: None.  Movement: Present. Denies leaking of fluid.   The following portions of the patient's history were reviewed and updated as appropriate: allergies, current medications, past family history, past medical history, past social history, past surgical history and problem list. Problem list updated.  Objective:   Filed Vitals:   12/11/15 0756  BP: 120/73  Pulse: 70  Weight: 217 lb (98.431 kg)    Fetal Status: Fetal Heart Rate (bpm): NST Fundal Height: 38 cm Movement: Present     General:  Alert, oriented and cooperative. Patient is in no acute distress.  Skin: Skin is warm and dry. No rash noted.   Cardiovascular: Normal heart rate noted  Respiratory: Normal respiratory effort, no problems with respiration noted  Abdomen: Soft, gravid, appropriate for gestational age. Pain/Pressure: Present     Pelvic: Vag. Bleeding: None     Cervical exam deferred        Extremities: Normal range of motion.  Edema: None  Mental Status: Normal mood and affect. Normal behavior. Normal judgment and thought content.   Urinalysis: Urine Protein: Negative Urine Glucose: Negative  Assessment and Plan:  Pregnancy: [id number] at [redacted]w[redacted]d  1. Prior pregnancy with fetal demise NST reactive today - Fetal nonstress test  Term labor  symptoms and general obstetric precautions including but not limited to vaginal bleeding, contractions, leaking of fluid and fetal movement were reviewed in detail with the patient. Please refer to After Visit Summary for other counseling recommendations.  Return in about 4 days (around 12/15/2015) for already has 5/8 and 5/11 appt. Woodroe Mode, MD

## 2015-12-12 ENCOUNTER — Ambulatory Visit (HOSPITAL_COMMUNITY): Payer: Managed Care, Other (non HMO)

## 2015-12-12 ENCOUNTER — Encounter (HOSPITAL_COMMUNITY): Payer: Self-pay

## 2015-12-15 ENCOUNTER — Ambulatory Visit (INDEPENDENT_AMBULATORY_CARE_PROVIDER_SITE_OTHER): Payer: Managed Care, Other (non HMO) | Admitting: *Deleted

## 2015-12-15 DIAGNOSIS — O09299 Supervision of pregnancy with other poor reproductive or obstetric history, unspecified trimester: Secondary | ICD-10-CM

## 2015-12-15 DIAGNOSIS — O09293 Supervision of pregnancy with other poor reproductive or obstetric history, third trimester: Secondary | ICD-10-CM | POA: Diagnosis not present

## 2015-12-15 NOTE — Progress Notes (Signed)
NST reactive.

## 2015-12-17 NOTE — Patient Instructions (Signed)
Sarah [name]  12/17/2015   Your procedure is scheduled on:  12/20/2015  Enter through the Main Entrance of [facility] up the phone at the desk and dial [phone].   Call this number if you have problems the morning of surgery: [phone]   Remember:   Do not eat food:After Midnight.  Do not drink clear liquids: After Midnight.  Take these medicines the morning of surgery with A SIP OF WATER: none   Do not wear jewelry, make-up or nail polish.  Do not wear lotions, powders, or perfumes. You may wear deodorant.  Do not shave 48 hours prior to surgery.  Do not bring valuables to the hospital.  [facility] is not   responsible for any belongings or valuables brought to the hospital.  Contacts, dentures or bridgework may not be worn into surgery.  Leave suitcase in the car. After surgery it may be brought to your room.  For patients admitted to the hospital, checkout time is 11:00 AM the day of              discharge.   Patients discharged the day of surgery will not be allowed to drive             home.  Name and phone number of your driver: na  Special Instructions:   Shower using CHG 2 nights before surgery and the night before surgery.  If you shower the day of surgery use CHG.  Use special wash - you have one bottle of CHG for all showers.  You should use approximately 1/3 of the bottle for each shower.   Please read over the following fact sheets that you were given:   Surgical Site Infection Prevention

## 2015-12-18 ENCOUNTER — Ambulatory Visit (INDEPENDENT_AMBULATORY_CARE_PROVIDER_SITE_OTHER): Payer: Managed Care, Other (non HMO) | Admitting: Family Medicine

## 2015-12-18 ENCOUNTER — Encounter (HOSPITAL_COMMUNITY)
Admission: RE | Admit: 2015-12-18 | Discharge: 2015-12-18 | Disposition: A | Discharge status: Home / Self Care | Payer: Managed Care, Other (non HMO) | Source: Ambulatory Visit | Attending: Obstetrics & Gynecology | Admitting: Obstetrics & Gynecology

## 2015-12-18 VITALS — BP 115/69 | HR 68 | Wt 216.5 lb

## 2015-12-18 DIAGNOSIS — G35 Multiple sclerosis: Secondary | ICD-10-CM

## 2015-12-18 DIAGNOSIS — O99353 Diseases of the nervous system complicating pregnancy, third trimester: Secondary | ICD-10-CM

## 2015-12-18 DIAGNOSIS — O09299 Supervision of pregnancy with other poor reproductive or obstetric history, unspecified trimester: Secondary | ICD-10-CM

## 2015-12-18 DIAGNOSIS — O09293 Supervision of pregnancy with other poor reproductive or obstetric history, third trimester: Secondary | ICD-10-CM

## 2015-12-18 DIAGNOSIS — Z36 Encounter for antenatal screening of mother: Secondary | ICD-10-CM

## 2015-12-18 DIAGNOSIS — O0993 Supervision of high risk pregnancy, unspecified, third trimester: Secondary | ICD-10-CM

## 2015-12-18 LAB — POCT URINALYSIS DIP (DEVICE)
BILIRUBIN URINE: NEGATIVE
GLUCOSE, UA: NEGATIVE mg/dL
Hgb urine dipstick: NEGATIVE
KETONES UR: NEGATIVE mg/dL
Leukocytes, UA: NEGATIVE
NITRITE: NEGATIVE
Protein, ur: NEGATIVE mg/dL
Specific Gravity, Urine: 1.015 (ref 1.005–1.030)
Urobilinogen, UA: 2 mg/dL — ABNORMAL HIGH (ref 0.0–1.0)
pH: 6.5 (ref 5.0–8.0)

## 2015-12-18 LAB — CBC
HEMATOCRIT: 32.5 % — AB (ref 36.0–46.0)
HEMOGLOBIN: 10.7 g/dL — AB (ref 12.0–15.0)
MCH: 28.7 pg (ref 26.0–34.0)
MCHC: 32.9 g/dL (ref 30.0–36.0)
MCV: 87.1 fL (ref 78.0–100.0)
Platelets: 222 10*3/uL (ref 150–400)
RBC: 3.73 MIL/uL — AB (ref 3.87–5.11)
RDW: 14.2 % (ref 11.5–15.5)
WBC: 5.8 10*3/uL (ref 4.0–10.5)

## 2015-12-18 LAB — ABO/RH: ABO/RH(D): O POS

## 2015-12-18 NOTE — Progress Notes (Signed)
Rpt C/S on 5/13.

## 2015-12-18 NOTE — Progress Notes (Signed)
Subjective:  Sarah [name] is a 35 y.o. [phone] at [redacted]w[redacted]d being seen today for ongoing prenatal care.  She is currently monitored for the following issues for this high-risk pregnancy and has Anemia, iron deficiency; Urinary incontinence; Supervision of high-risk pregnancy; Previous cesarean delivery, antepartum condition or complication; Prior pregnancy with fetal demise; Multiple sclerosis ([place]); Multiple sclerosis affecting pregnancy ([place]); History of twin pregnancy in prior pregnancy; Late prenatal care affecting pregnancy in second trimester, antepartum; and History of melanoma on her problem list.  Patient reports no complaints.  Contractions: Irregular. Vag. Bleeding: None.  Movement: Present. Denies leaking of fluid.   The following portions of the patient's history were reviewed and updated as appropriate: allergies, current medications, past family history, past medical history, past social history, past surgical history and problem list. Problem list updated.  Objective:   Filed Vitals:   12/18/15 0841  BP: 115/69  Pulse: 68  Weight: 216 lb 8 oz (98.204 kg)    Fetal Status: Fetal Heart Rate (bpm): NST   Movement: Present     General:  Alert, oriented and cooperative. Patient is in no acute distress.  Skin: Skin is warm and dry. No rash noted.   Cardiovascular: Normal heart rate noted  Respiratory: Normal respiratory effort, no problems with respiration noted  Abdomen: Soft, gravid, appropriate for gestational age. Pain/Pressure: Present     Pelvic: Vag. Bleeding: None     Cervical exam deferred        Extremities: Normal range of motion.  Edema: Trace  Mental Status: Normal mood and affect. Normal behavior. Normal judgment and thought content.   Urinalysis:      Assessment and Plan:  Pregnancy: [id number] at [redacted]w[redacted]d  1. Prior pregnancy with fetal demise NST reactive.  AFI normal. - Amniotic fluid index with NST  2. Supervision of high-risk pregnancy, third trimester C/s  scheduled in 2 days.  Term labor symptoms and general obstetric precautions including but not limited to vaginal bleeding, contractions, leaking of fluid and fetal movement were reviewed in detail with the patient. Please refer to After Visit Summary for other counseling recommendations.  Return in about 6 weeks (around 01/28/2016) for PP as scheduled.   Truett Mainland, DO

## 2015-12-19 ENCOUNTER — Encounter (HOSPITAL_COMMUNITY)
Admission: RE | Admit: 2015-12-19 | Discharge: 2015-12-19 | Disposition: A | Discharge status: Home / Self Care | Payer: Managed Care, Other (non HMO) | Source: Ambulatory Visit

## 2015-12-19 LAB — RPR: RPR: NONREACTIVE

## 2015-12-20 ENCOUNTER — Inpatient Hospital Stay (HOSPITAL_COMMUNITY)
Admission: RE | Admit: 2015-12-20 | Discharge: 2015-12-22 | DRG: 765 | Disposition: A | Discharge status: Home / Self Care | Payer: Managed Care, Other (non HMO) | Source: Ambulatory Visit | Attending: Obstetrics & Gynecology | Admitting: Obstetrics & Gynecology

## 2015-12-20 ENCOUNTER — Inpatient Hospital Stay (HOSPITAL_COMMUNITY)
Admission: AD | Admit: 2015-12-20 | Discharge: 2015-12-20 | Disposition: A | Discharge status: Home / Self Care | Payer: Managed Care, Other (non HMO) | Source: Ambulatory Visit | Attending: Obstetrics & Gynecology | Admitting: Obstetrics & Gynecology

## 2015-12-20 ENCOUNTER — Inpatient Hospital Stay (HOSPITAL_COMMUNITY): Payer: Managed Care, Other (non HMO) | Admitting: Anesthesiology

## 2015-12-20 ENCOUNTER — Encounter (HOSPITAL_COMMUNITY): Payer: Self-pay | Admitting: *Deleted

## 2015-12-20 ENCOUNTER — Encounter (HOSPITAL_COMMUNITY)
Admission: RE | Disposition: A | Discharge status: Home / Self Care | Payer: Self-pay | Source: Ambulatory Visit | Attending: Obstetrics & Gynecology

## 2015-12-20 DIAGNOSIS — O09299 Supervision of pregnancy with other poor reproductive or obstetric history, unspecified trimester: Secondary | ICD-10-CM

## 2015-12-20 DIAGNOSIS — O99354 Diseases of the nervous system complicating childbirth: Secondary | ICD-10-CM | POA: Diagnosis not present

## 2015-12-20 DIAGNOSIS — O0990 Supervision of high risk pregnancy, unspecified, unspecified trimester: Secondary | ICD-10-CM

## 2015-12-20 DIAGNOSIS — D509 Iron deficiency anemia, unspecified: Secondary | ICD-10-CM | POA: Diagnosis present

## 2015-12-20 DIAGNOSIS — O9902 Anemia complicating childbirth: Secondary | ICD-10-CM | POA: Diagnosis present

## 2015-12-20 DIAGNOSIS — Z3A39 39 weeks gestation of pregnancy: Secondary | ICD-10-CM

## 2015-12-20 DIAGNOSIS — Z85820 Personal history of malignant melanoma of skin: Secondary | ICD-10-CM | POA: Diagnosis not present

## 2015-12-20 DIAGNOSIS — Z98891 History of uterine scar from previous surgery: Secondary | ICD-10-CM

## 2015-12-20 DIAGNOSIS — Z833 Family history of diabetes mellitus: Secondary | ICD-10-CM | POA: Diagnosis not present

## 2015-12-20 DIAGNOSIS — G35 Multiple sclerosis: Secondary | ICD-10-CM | POA: Diagnosis not present

## 2015-12-20 DIAGNOSIS — O34219 Maternal care for unspecified type scar from previous cesarean delivery: Secondary | ICD-10-CM | POA: Diagnosis present

## 2015-12-20 DIAGNOSIS — O34211 Maternal care for low transverse scar from previous cesarean delivery: Principal | ICD-10-CM | POA: Diagnosis present

## 2015-12-20 DIAGNOSIS — Z302 Encounter for sterilization: Secondary | ICD-10-CM

## 2015-12-20 DIAGNOSIS — Z8249 Family history of ischemic heart disease and other diseases of the circulatory system: Secondary | ICD-10-CM | POA: Diagnosis not present

## 2015-12-20 HISTORY — DX: History of uterine scar from previous surgery: Z98.891

## 2015-12-20 LAB — PREPARE RBC (CROSSMATCH)

## 2015-12-20 SURGERY — Surgical Case
Anesthesia: Spinal

## 2015-12-20 MED ORDER — ONDANSETRON HCL 4 MG/2ML IJ SOLN: 4.0000 mg | Freq: Three times a day (TID) | INTRAMUSCULAR | Status: DC | PRN

## 2015-12-20 MED ORDER — DIPHENHYDRAMINE HCL 25 MG PO CAPS
25.0000 mg | ORAL_CAPSULE | ORAL | Status: DC | PRN
  Filled 2015-12-20: qty 1

## 2015-12-20 MED ORDER — DIBUCAINE 1 % RE OINT: 1.0000 "application " | TOPICAL_OINTMENT | RECTAL | Status: DC | PRN

## 2015-12-20 MED ORDER — MORPHINE SULFATE (PF) 0.5 MG/ML IJ SOLN
INTRAMUSCULAR | Status: DC | PRN
  Administered 2015-12-20: .1 mg via INTRATHECAL

## 2015-12-20 MED ORDER — NALBUPHINE HCL 10 MG/ML IJ SOLN: 5.0000 mg | INTRAMUSCULAR | Status: DC | PRN

## 2015-12-20 MED ORDER — NALOXONE HCL 2 MG/2ML IJ SOSY: 1.0000 ug/kg/h | PREFILLED_SYRINGE | INTRAVENOUS | Status: DC | PRN

## 2015-12-20 MED ORDER — DIPHENHYDRAMINE HCL 25 MG PO CAPS: 25.0000 mg | ORAL_CAPSULE | ORAL | Status: DC | PRN

## 2015-12-20 MED ORDER — NALOXONE HCL 2 MG/2ML IJ SOSY
1.0000 ug/kg/h | PREFILLED_SYRINGE | INTRAMUSCULAR | Status: DC | PRN
  Filled 2015-12-20: qty 2

## 2015-12-20 MED ORDER — NALOXONE HCL 0.4 MG/ML IJ SOLN: 0.4000 mg | INTRAMUSCULAR | Status: DC | PRN

## 2015-12-20 MED ORDER — MEPERIDINE HCL 25 MG/ML IJ SOLN: 6.2500 mg | INTRAMUSCULAR | Status: DC | PRN

## 2015-12-20 MED ORDER — ONDANSETRON HCL 4 MG/2ML IJ SOLN
INTRAMUSCULAR | Status: DC | PRN
  Administered 2015-12-20: 4 mg via INTRAVENOUS

## 2015-12-20 MED ORDER — CITRIC ACID-SODIUM CITRATE 334-500 MG/5ML PO SOLN
ORAL | Status: AC
  Administered 2015-12-20: 10:00:00
  Filled 2015-12-20: qty 15

## 2015-12-20 MED ORDER — IBUPROFEN 600 MG PO TABS
600.0000 mg | ORAL_TABLET | Freq: Four times a day (QID) | ORAL | Status: DC
  Administered 2015-12-20 – 2015-12-22 (×7): 600 mg via ORAL
  Filled 2015-12-20 (×7): qty 1

## 2015-12-20 MED ORDER — KETOROLAC TROMETHAMINE 30 MG/ML IJ SOLN
30.0000 mg | Freq: Four times a day (QID) | INTRAMUSCULAR | Status: AC | PRN
  Administered 2015-12-20: 30 mg via INTRAVENOUS
  Filled 2015-12-20: qty 1

## 2015-12-20 MED ORDER — TETANUS-DIPHTH-ACELL PERTUSSIS 5-2.5-18.5 LF-MCG/0.5 IM SUSP: 0.5000 mL | Freq: Once | INTRAMUSCULAR | Status: DC

## 2015-12-20 MED ORDER — OXYTOCIN 10 UNIT/ML IJ SOLN
40.0000 [IU] | INTRAVENOUS | Status: DC | PRN
  Administered 2015-12-20: 40 [IU] via INTRAVENOUS

## 2015-12-20 MED ORDER — ERYTHROMYCIN 5 MG/GM OP OINT
TOPICAL_OINTMENT | OPHTHALMIC | Status: AC
Start: 2015-12-20 — End: 2015-12-21
  Filled 2015-12-20: qty 1

## 2015-12-20 MED ORDER — BUPIVACAINE IN DEXTROSE 0.75-8.25 % IT SOLN
INTRATHECAL | Status: DC | PRN
  Administered 2015-12-20: 11.25 mg via INTRATHECAL

## 2015-12-20 MED ORDER — OXYCODONE HCL 5 MG PO TABS
5.0000 mg | ORAL_TABLET | ORAL | Status: DC | PRN
  Administered 2015-12-21 – 2015-12-22 (×5): 5 mg via ORAL
  Filled 2015-12-20 (×5): qty 1

## 2015-12-20 MED ORDER — FENTANYL CITRATE (PF) 100 MCG/2ML IJ SOLN
INTRAMUSCULAR | Status: AC
  Filled 2015-12-20: qty 2

## 2015-12-20 MED ORDER — PHENYLEPHRINE 8 MG IN D5W 100 ML (0.08MG/ML) PREMIX OPTIME
INJECTION | INTRAVENOUS | Status: AC
  Filled 2015-12-20: qty 100

## 2015-12-20 MED ORDER — ACETAMINOPHEN 325 MG PO TABS: 650.0000 mg | ORAL_TABLET | ORAL | Status: DC | PRN

## 2015-12-20 MED ORDER — PHENYLEPHRINE 8 MG IN D5W 100 ML (0.08MG/ML) PREMIX OPTIME
INJECTION | INTRAVENOUS | Status: DC | PRN
  Administered 2015-12-20: 60 ug/min via INTRAVENOUS

## 2015-12-20 MED ORDER — SCOPOLAMINE 1 MG/3DAYS TD PT72: 1.0000 | MEDICATED_PATCH | Freq: Once | TRANSDERMAL | Status: DC

## 2015-12-20 MED ORDER — WITCH HAZEL-GLYCERIN EX PADS: 1.0000 "application " | MEDICATED_PAD | CUTANEOUS | Status: DC | PRN

## 2015-12-20 MED ORDER — LACTATED RINGERS IV SOLN
INTRAVENOUS | Status: DC
  Administered 2015-12-20: 11:00:00 via INTRAVENOUS

## 2015-12-20 MED ORDER — COCONUT OIL OIL
1.0000 "application " | TOPICAL_OIL | Status: DC | PRN
  Administered 2015-12-22: 1 via TOPICAL

## 2015-12-20 MED ORDER — SIMETHICONE 80 MG PO CHEW
80.0000 mg | CHEWABLE_TABLET | ORAL | Status: DC
  Administered 2015-12-20 – 2015-12-22 (×2): 80 mg via ORAL
  Filled 2015-12-20 (×2): qty 1

## 2015-12-20 MED ORDER — METOCLOPRAMIDE HCL 5 MG/ML IJ SOLN: 10.0000 mg | Freq: Once | INTRAMUSCULAR | Status: DC | PRN

## 2015-12-20 MED ORDER — MORPHINE SULFATE (PF) 0.5 MG/ML IJ SOLN
INTRAMUSCULAR | Status: AC
  Filled 2015-12-20: qty 10

## 2015-12-20 MED ORDER — FENTANYL CITRATE (PF) 100 MCG/2ML IJ SOLN
INTRAMUSCULAR | Status: DC | PRN
  Administered 2015-12-20: 12.5 ug via INTRATHECAL

## 2015-12-20 MED ORDER — KETOROLAC TROMETHAMINE 30 MG/ML IJ SOLN: 30.0000 mg | Freq: Four times a day (QID) | INTRAMUSCULAR | Status: DC | PRN

## 2015-12-20 MED ORDER — CEFAZOLIN SODIUM-DEXTROSE 2-4 GM/100ML-% IV SOLN
2.0000 g | INTRAVENOUS | Status: AC
  Administered 2015-12-20: 2 g via INTRAVENOUS

## 2015-12-20 MED ORDER — CEFAZOLIN SODIUM-DEXTROSE 2-4 GM/100ML-% IV SOLN
INTRAVENOUS | Status: AC
  Filled 2015-12-20: qty 100

## 2015-12-20 MED ORDER — NALBUPHINE HCL 10 MG/ML IJ SOLN: 5.0000 mg | Freq: Once | INTRAMUSCULAR | Status: AC | PRN

## 2015-12-20 MED ORDER — DIPHENHYDRAMINE HCL 25 MG PO CAPS: 25.0000 mg | ORAL_CAPSULE | Freq: Four times a day (QID) | ORAL | Status: DC | PRN

## 2015-12-20 MED ORDER — DIPHENHYDRAMINE HCL 50 MG/ML IJ SOLN: 12.5000 mg | INTRAMUSCULAR | Status: DC | PRN

## 2015-12-20 MED ORDER — SCOPOLAMINE 1 MG/3DAYS TD PT72
MEDICATED_PATCH | TRANSDERMAL | Status: AC
  Filled 2015-12-20: qty 1

## 2015-12-20 MED ORDER — NALBUPHINE HCL 10 MG/ML IJ SOLN
5.0000 mg | Freq: Once | INTRAMUSCULAR | Status: AC | PRN
  Administered 2015-12-20: 5 mg via INTRAVENOUS

## 2015-12-20 MED ORDER — SODIUM CHLORIDE 0.9% FLUSH: 3.0000 mL | INTRAVENOUS | Status: DC | PRN

## 2015-12-20 MED ORDER — ONDANSETRON HCL 4 MG/2ML IJ SOLN
4.0000 mg | Freq: Three times a day (TID) | INTRAMUSCULAR | Status: DC | PRN
  Administered 2015-12-20: 4 mg via INTRAVENOUS
  Filled 2015-12-20: qty 2

## 2015-12-20 MED ORDER — SIMETHICONE 80 MG PO CHEW
80.0000 mg | CHEWABLE_TABLET | Freq: Three times a day (TID) | ORAL | Status: DC
  Administered 2015-12-21 – 2015-12-22 (×4): 80 mg via ORAL
  Filled 2015-12-20 (×4): qty 1

## 2015-12-20 MED ORDER — SCOPOLAMINE 1 MG/3DAYS TD PT72
MEDICATED_PATCH | TRANSDERMAL | Status: DC | PRN
  Administered 2015-12-20: 1 via TRANSDERMAL

## 2015-12-20 MED ORDER — LACTATED RINGERS IV BOLUS (SEPSIS)
500.0000 mL | Freq: Once | INTRAVENOUS | Status: AC
  Administered 2015-12-20: 500 mL via INTRAVENOUS

## 2015-12-20 MED ORDER — OXYTOCIN 10 UNIT/ML IJ SOLN
INTRAMUSCULAR | Status: AC
  Filled 2015-12-20: qty 4

## 2015-12-20 MED ORDER — OXYTOCIN 40 UNITS IN LACTATED RINGERS INFUSION - SIMPLE MED: 2.5000 [IU]/h | INTRAVENOUS | Status: AC

## 2015-12-20 MED ORDER — LACTATED RINGERS IV SOLN
INTRAVENOUS | Status: DC | PRN
  Administered 2015-12-20: 11:00:00 via INTRAVENOUS

## 2015-12-20 MED ORDER — SODIUM CHLORIDE 0.9 % IR SOLN
Status: DC | PRN
  Administered 2015-12-20: 1000 mL

## 2015-12-20 MED ORDER — BUPIVACAINE HCL (PF) 0.5 % IJ SOLN
INTRAMUSCULAR | Status: DC | PRN
  Administered 2015-12-20: 30 mL

## 2015-12-20 MED ORDER — SIMETHICONE 80 MG PO CHEW: 80.0000 mg | CHEWABLE_TABLET | ORAL | Status: DC | PRN

## 2015-12-20 MED ORDER — NALBUPHINE HCL 10 MG/ML IJ SOLN: 5.0000 mg | Freq: Once | INTRAMUSCULAR | Status: DC | PRN

## 2015-12-20 MED ORDER — ONDANSETRON HCL 4 MG/2ML IJ SOLN
INTRAMUSCULAR | Status: AC
  Filled 2015-12-20: qty 2

## 2015-12-20 MED ORDER — BUPIVACAINE HCL (PF) 0.5 % IJ SOLN
INTRAMUSCULAR | Status: AC
  Filled 2015-12-20: qty 30

## 2015-12-20 MED ORDER — LACTATED RINGERS IV SOLN
INTRAVENOUS | Status: DC
  Administered 2015-12-20 – 2015-12-21 (×2): 999 mL via INTRAVENOUS
  Administered 2015-12-21: 10:00:00 via INTRAVENOUS

## 2015-12-20 MED ORDER — VITAMIN K1 1 MG/0.5ML IJ SOLN
INTRAMUSCULAR | Status: AC
  Filled 2015-12-20: qty 0.5

## 2015-12-20 MED ORDER — PRENATAL MULTIVITAMIN CH
1.0000 | ORAL_TABLET | Freq: Every day | ORAL | Status: DC
Start: 2015-12-20 — End: 2015-12-22
  Administered 2015-12-21 – 2015-12-22 (×2): 1 via ORAL
  Filled 2015-12-20 (×2): qty 1

## 2015-12-20 MED ORDER — ZOLPIDEM TARTRATE 5 MG PO TABS: 5.0000 mg | ORAL_TABLET | Freq: Every evening | ORAL | Status: DC | PRN

## 2015-12-20 MED ORDER — NALBUPHINE HCL 10 MG/ML IJ SOLN
5.0000 mg | INTRAMUSCULAR | Status: DC | PRN
  Filled 2015-12-20: qty 1

## 2015-12-20 MED ORDER — SENNOSIDES-DOCUSATE SODIUM 8.6-50 MG PO TABS
2.0000 | ORAL_TABLET | ORAL | Status: DC
  Administered 2015-12-20 – 2015-12-22 (×2): 2 via ORAL
  Filled 2015-12-20 (×3): qty 2

## 2015-12-20 MED ORDER — OXYCODONE HCL 5 MG PO TABS: 10.0000 mg | ORAL_TABLET | ORAL | Status: DC | PRN

## 2015-12-20 MED ORDER — KETOROLAC TROMETHAMINE 30 MG/ML IJ SOLN: 30.0000 mg | Freq: Four times a day (QID) | INTRAMUSCULAR | Status: AC | PRN

## 2015-12-20 MED ORDER — MENTHOL 3 MG MT LOZG: 1.0000 | LOZENGE | OROMUCOSAL | Status: DC | PRN

## 2015-12-20 MED ORDER — FENTANYL CITRATE (PF) 100 MCG/2ML IJ SOLN: 25.0000 ug | INTRAMUSCULAR | Status: DC | PRN

## 2015-12-20 MED ORDER — LACTATED RINGERS IV SOLN: INTRAVENOUS | Status: DC

## 2015-12-20 SURGICAL SUPPLY — 43 items
BENZOIN TINCTURE PRP APPL 2/3 (GAUZE/BANDAGES/DRESSINGS) ×3 IMPLANT
BINDER ABD UNIV 10 28-50 (GAUZE/BANDAGES/DRESSINGS) ×1 IMPLANT
BINDER ABD UNIV 12 45-62 (WOUND CARE) IMPLANT
BINDER ABDOM UNIV 10 (GAUZE/BANDAGES/DRESSINGS) ×3
BINDER ABDOMINAL 46IN 62IN (WOUND CARE)
CLAMP CORD UMBIL (MISCELLANEOUS) IMPLANT
CLIP FILSHIE TUBAL LIGA STRL (Clip) ×3 IMPLANT
CLOSURE WOUND 1/2 X4 (GAUZE/BANDAGES/DRESSINGS) ×1
CLOTH BEACON ORANGE TIMEOUT ST (SAFETY) ×3 IMPLANT
DRSG OPSITE POSTOP 4X10 (GAUZE/BANDAGES/DRESSINGS) ×3 IMPLANT
DRSG PAD ABDOMINAL 8X10 ST (GAUZE/BANDAGES/DRESSINGS) ×3 IMPLANT
DURAPREP 26ML APPLICATOR (WOUND CARE) ×3 IMPLANT
ELECT REM PT RETURN 9FT ADLT (ELECTROSURGICAL) ×3
ELECTRODE REM PT RTRN 9FT ADLT (ELECTROSURGICAL) ×1 IMPLANT
EXTRACTOR VACUUM KIWI (MISCELLANEOUS) IMPLANT
GLOVE BIO SURGEON STRL SZ7 (GLOVE) ×3 IMPLANT
GLOVE BIOGEL PI IND STRL 7.0 (GLOVE) ×2 IMPLANT
GLOVE BIOGEL PI INDICATOR 7.0 (GLOVE) ×4
GOWN STRL REUS W/TWL LRG LVL3 (GOWN DISPOSABLE) ×6 IMPLANT
GOWN STRL REUS W/TWL XL LVL3 (GOWN DISPOSABLE) ×3 IMPLANT
HEMOSTAT ARISTA ABSORB 3G PWDR (MISCELLANEOUS) ×3 IMPLANT
KIT ABG SYR 3ML LUER SLIP (SYRINGE) IMPLANT
NEEDLE HYPO 22GX1.5 SAFETY (NEEDLE) ×3 IMPLANT
NEEDLE HYPO 25X5/8 SAFETYGLIDE (NEEDLE) IMPLANT
NS IRRIG 1000ML POUR BTL (IV SOLUTION) ×3 IMPLANT
PACK C SECTION WH (CUSTOM PROCEDURE TRAY) ×3 IMPLANT
PAD OB MATERNITY 4.3X12.25 (PERSONAL CARE ITEMS) ×3 IMPLANT
PENCIL SMOKE EVAC W/HOLSTER (ELECTROSURGICAL) ×3 IMPLANT
RTRCTR C-SECT PINK 25CM LRG (MISCELLANEOUS) IMPLANT
SPONGE GAUZE 4X4 12PLY STER LF (GAUZE/BANDAGES/DRESSINGS) ×6 IMPLANT
SPONGE SURGIFOAM ABS GEL 12-7 (HEMOSTASIS) IMPLANT
STRIP CLOSURE SKIN 1/2X4 (GAUZE/BANDAGES/DRESSINGS) ×2 IMPLANT
SUT PDS AB 0 CTX 60 (SUTURE) ×6 IMPLANT
SUT PLAIN 0 NONE (SUTURE) IMPLANT
SUT SILK 0 TIES 10X30 (SUTURE) IMPLANT
SUT VIC AB 0 CT1 36 (SUTURE) ×15 IMPLANT
SUT VIC AB 3-0 CT1 27 (SUTURE) ×2
SUT VIC AB 3-0 CT1 TAPERPNT 27 (SUTURE) ×1 IMPLANT
SUT VIC AB 4-0 KS 27 (SUTURE) IMPLANT
SYR CONTROL 10ML LL (SYRINGE) ×3 IMPLANT
TAPE CLOTH SURG 4X10 WHT LF (GAUZE/BANDAGES/DRESSINGS) ×3 IMPLANT
TOWEL OR 17X24 6PK STRL BLUE (TOWEL DISPOSABLE) ×3 IMPLANT
TRAY FOLEY CATH SILVER 14FR (SET/KITS/TRAYS/PACK) ×3 IMPLANT

## 2015-12-20 NOTE — Anesthesia Preprocedure Evaluation (Signed)
Anesthesia Evaluation  Patient identified by MRN, date of birth, ID band Patient awake    Reviewed: Allergy & Precautions, NPO status , Patient's Chart, lab work & pertinent test results  Airway Mallampati: II  TM Distance: >3 FB Neck ROM: Full    Dental no notable dental hx.    Pulmonary neg pulmonary ROS,    Pulmonary exam normal breath sounds clear to auscultation       Cardiovascular negative cardio ROS Normal cardiovascular exam Rhythm:Regular Rate:Normal     Neuro/Psych Multiple sclerosis negative psych ROS   GI/Hepatic negative GI ROS, Neg liver ROS,   Endo/Other  negative endocrine ROS  Renal/GU negative Renal ROS  negative genitourinary   Musculoskeletal negative musculoskeletal ROS (+)   Abdominal   Peds negative pediatric ROS (+)  Hematology negative hematology ROS (+)   Anesthesia Other Findings   Reproductive/Obstetrics (+) Pregnancy                             Anesthesia Physical Anesthesia Plan  ASA: II  Anesthesia Plan: Spinal   Post-op Pain Management:    Induction:   Airway Management Planned: Natural Airway  Additional Equipment:   Intra-op Plan:   Post-operative Plan:   Informed Consent: I have reviewed the patients History and Physical, chart, labs and discussed the procedure including the risks, benefits and alternatives for the proposed anesthesia with the patient or authorized representative who has indicated his/her understanding and acceptance.   Dental advisory given  Plan Discussed with: CRNA  Anesthesia Plan Comments:         Anesthesia Quick Evaluation

## 2015-12-20 NOTE — Op Note (Addendum)
Cesarean Section Operative Report  Sarah [name]  12/20/2015  Indications: Scheduled Proceedure/Maternal Request   Pre-operative Diagnosis: cpt [id number] - REPEAT c-section x's 4, undesired fertility.   Post-operative Diagnosis: Same   Surgeon: Surgeon(s) and Role:    * Lavonia Drafts, MD - Primary  Attending Attestation: I was present and scrubbed for the entire procedure.   Assistants:    Gwynne Edinger, MD    Anesthesia: spinal    Estimated Blood Loss: 800 ml  Total IV Fluids: 2500 ml LR  Urine Output:: 250 ml clear yellow urine  Specimens: none  Findings: Viable female infant in cephalic presentation; Apgars 9/9; weight 3220g; arterial cord pH not obtained; clear amniotic fluid; intact placenta with three vessel cord; normal uterus, fallopian tubes and ovaries bilaterally. Significant adhesions abdominal wall to lower uterine segment.  Baby condition / location:  Couplet care / Skin to Skin   Complications: no complications  Indications: Sarah [name] is a 35 y.o. [id number] with an IUP [redacted]w[redacted]d presenting for repeat c/s (4th) and BTL  The risks, benefits, complications, treatment options, and expected outcomes were discussed with the patient . The patient concurred with the proposed plan, giving informed consent. identified as Sarah [name] and the procedure verified as C-Section Delivery.  Procedure Details:  The patient was taken back to the operative suite where spinal anesthesia was placed.  A time out was held and the above information confirmed.   After induction of anesthesia, the patient was draped and prepped in the usual sterile manner and placed in a dorsal supine position with a leftward tilt. A transverse incision was made approximately 2 cm superior to the patient's prior c/s scar and carried down through the subcutaneous tissue to the fascia. Fascial incision was made and sharply extended transversely. The fascia was separated from the underlying  rectus tissue superiorly and inferiorly. The peritoneum was identified and sharply entered and extended longitudinally. Dense adhesions to the lower uterine segment were encountered and were lysed so as to provide exposure to the lower uterine segment.  After removal of an adhesion on the right side of the lower uterine incision a uterine window was noted that reveal no tissue planes between the abdominal wall tha tthe amniotic sac. A low transverse uterine incision was made and extended with bandage scissors and then bluntly. The infant was delivered in cephalic presentation.  The infant was viable with Apgars and weight as above. The umbilical cord was wrapped around the infants body and feet but, was noted to be loose. The umbilical cord was clamped and cut. Cord blood was obtained for evaluation. Cord ph was not sent. The placenta was removed Intact and appeared normal. The uterine outline, tubes and ovaries appeared normal after exteriorization of the uterus. The uterus was cleared of all cots and debris. The uterine incision was closed with running locked sutures of 0 Vicryl in one layer. Several figure of eight sutures were required to achieve hemostasis at the hysterotomy site.  Hemostasis was observed. The patient had previously indicated that if dense adhesions were encountered, that she would like a BTL, and she was consented for such. We explained to patient that indeed dense adhesions were encountered, and she confirmed her desire to proceed with BTL. The Fallopian tubes were identified bilaterally.  A Filshie clip was placed on each tube without difficulty 3 cm from the cornua.  There was no bleeding.  The uterus was returned to the pelvis.  The gutters were evaluated  and cleared of all clots.  Excellent hemostasis was again appreciated. Arista hemostatic agent then applied to the lower uterine incision.  The rectus muscles and peritoneum were then reapproximated with one 0 vicryl suture. The rectus  muscles were examined and hemostasis observed. The fascia was then reapproximated with running sutures of 0 looped PDS.  A total of 30 ml 0.5% Marcaine was injected subcutaneously at the margins of the incision. The subcuticular closure was performed using 3-0Vicryl. The skin was closed with 4-0Vicryl.   Instrument, sponge, and needle counts were correct prior the abdominal closure and were correct at the conclusion of the case.   Disposition: PACU - hemodynamically stable.   Maternal Condition: stable   Signed: Ennis Forts 12/20/2015 12:55 PM   Attestation of Attending Supervision of Fellow: Evaluation and management procedures were performed by the Fellow under my supervision and collaboration.  I have reviewed the Fellow's note and chart, and amended the note as appropriate.  I was scrubbed for the entire procedure. I agree with the management and plan.  Mickie Badders L. Harraway-Smith, M.D., Cherlynn June

## 2015-12-20 NOTE — Anesthesia Postprocedure Evaluation (Signed)
Anesthesia Post Note  Patient: Sarah [name]  Procedure(s) Performed: Procedure(s) (LRB): CESAREAN SECTION WITH BILATERAL TUBAL LIGATION (N/A)  Patient location during evaluation: PACU Anesthesia Type: Spinal Level of consciousness: oriented and awake and alert Pain management: pain level controlled Vital Signs Assessment: post-procedure vital signs reviewed and stable Respiratory status: spontaneous breathing, respiratory function stable and patient connected to nasal cannula oxygen Cardiovascular status: blood pressure returned to baseline and stable Postop Assessment: no headache, no backache and spinal receding Anesthetic complications: no     Last Vitals:  Filed Vitals:   12/20/15 1241 12/20/15 1242  BP:    Pulse: 64 63  Temp:    Resp: 19 18    Last Pain: There were no vitals filed for this visit. Pain Goal:                 Montez Hageman

## 2015-12-20 NOTE — Addendum Note (Signed)
Addendum  created 12/20/15 1544 by Montez Hageman, MD   Modules edited: Orders, PRL Based Order Sets

## 2015-12-20 NOTE — Anesthesia Procedure Notes (Signed)
Spinal Patient location during procedure: OR Staffing Anesthesiologist: Asante Ritacco Performed by: anesthesiologist  Preanesthetic Checklist Completed: patient identified, site marked, surgical consent, pre-op evaluation, timeout performed, IV checked, risks and benefits discussed and monitors and equipment checked Spinal Block Patient position: sitting Prep: ChloraPrep Patient monitoring: heart rate, continuous pulse ox and blood pressure Approach: midline Location: L4-5 Injection technique: single-shot Needle Needle type: Sprotte  Needle gauge: 24 G Needle length: 9 cm Additional Notes Expiration date of kit checked and confirmed. Patient tolerated procedure well, without complications.     

## 2015-12-20 NOTE — H&P (Signed)
Obstetric Preoperative History and Physical  Sarah [name] is a 35 y.o. [phone] with IUP at [redacted]w[redacted]d presenting for presenting for scheduled cesarean section with bilateral tubal ligation.   Pt with no acute complaints. Pt with MS.  No flares this pregnancy. Off meds since pregnancy.        Prenatal Course [facility]  with onset of care at 24 weeks Pregnancy complications or risks: Patient Active Problem List   Diagnosis Date Noted  . History of melanoma 09/16/2015  . History of twin pregnancy in prior pregnancy 09/09/2015  . Late prenatal care affecting pregnancy in second trimester, antepartum 09/09/2015  . Supervision of high-risk pregnancy 08/22/2015  . Previous cesarean delivery, antepartum condition or complication 0000000  . Prior pregnancy with fetal demise 08/22/2015  . Multiple sclerosis (Sarah [name]) 08/22/2015  . Multiple sclerosis affecting pregnancy (Sarah [name]) 08/22/2015  . Anemia, iron deficiency 09/09/2012  . Urinary incontinence 09/09/2012   She plans to breastfeed She desires bilateral tubal ligation for postpartum contraception.   Prenatal labs and studies: ABO, Rh: --/--/O POS, O POS (05/11 1010) Antibody: NEG (05/11 1010) Rubella: Immune (10/14 0000) RPR: Non Reactive (05/11 1010)  HBsAg: Negative (10/14 0000)  HIV: NONREACTIVE (02/23 0850)  [id number] (04/20 0000) 1 hr Glucola  129 Genetic screening too late to care Anatomy US normal  Prenatal Transfer Tool  Maternal Diabetes: No Genetic Screening: Declined Maternal Ultrasounds/Referrals: Normal Fetal Ultrasounds or other Referrals:  None Maternal Substance Abuse:  No Significant Maternal Medications:  None Significant Maternal Lab Results: None  Past Medical History  Diagnosis Date  . Multiple sclerosis (Sarah [name])   . Heart murmur   . Multiple sclerosis (Sarah [name])   . Melanoma (Sarah [name]) 1996    on head  . Headache     Past Surgical History  Procedure Laterality Date  . Cesarean section    .  Melanoma excision  age 21    on head    OB History  Gravida Para Term Preterm AB SAB TAB Ectopic Multiple Living  5 4 2 2  0 0 0 0 1 3    # Outcome Date GA Lbr Len/2nd Weight Sex Delivery Anes PTL Lv  5 Current           4A Preterm 11/24/07 [redacted]w[redacted]d  5 lb 6 oz (2.438 kg) F CS-Unspec Spinal N      Comments: Early c/s due to IUGR of girl.  Infant died at 19 months of age.  4B Preterm 11/24/07 [redacted]w[redacted]d  5 lb 4 oz (2.381 kg) M CS-Unspec Spinal N Y  3 Term 03/12/05 [redacted]w[redacted]d  8 lb 10 oz (3.912 kg) M CS-Unspec Spinal N Y  2 Term 07/10/03 [redacted]w[redacted]d  8 lb 9 oz (3.884 kg) F CS-Unspec EPI N Y  1 Preterm 2003 [redacted]w[redacted]d    Vag-Spont   ND      Social History   Social History  . Marital Status: Divorced    Spouse Name: N/A  . Number of Children: N/A  . Years of Education: N/A   Social History Main Topics  . Smoking status: Never Smoker   . Smokeless tobacco: Never Used  . Alcohol Use: No  . Drug Use: No  . Sexual Activity: Yes   Other Topics Concern  . Not on file   Social History Narrative    Family History  Problem Relation Age of Onset  . Diabetes Mother   . Diabetes Maternal Uncle   . Hypertension Maternal Grandmother  Prescriptions prior to admission  Medication Sig Dispense Refill Last Dose  . acetaminophen (TYLENOL) 500 MG tablet Take 500 mg by mouth every 6 (six) hours as needed for moderate pain.    Taking  . Prenat-Fe Carbonyl-FA-Omega 3 (ONE-A-DAY WOMENS PRENATAL 1) 28-0.8-235 MG CAPS Take 1 tablet by mouth. Reported on 10/23/2015   Taking    No Known Allergies  Review of Systems: Negative except for what is mentioned in HPI.  Physical Exam: LMP 03/22/2015 FHR by Doppler: 130's bpm CONSTITUTIONAL: Well-developed, well-nourished female in no acute distress.  HENT:  Normocephalic, atraumatic, External right and left ear normal. Oropharynx is clear and moist EYES: Conjunctivae and EOM are normal. Pupils are equal, round, and reactive to light. No scleral icterus.  NECK: Normal  range of motion, supple, no masses SKIN: Skin is warm and dry. No rash noted. Not diaphoretic. No erythema. No pallor. [place]: Alert and oriented to person, place, and time.  PSYCHIATRIC: Normal mood and affect. Normal behavior. Normal judgment and thought content. CARDIOVASCULAR: Normal heart rate noted, regular rhythm RESPIRATORY: Effort and breath sounds normal, no problems with respiration noted ABDOMEN: Soft, nontender, nondistended, gravid. Well-healed Pfannenstiel incision. PELVIC: Deferred MUSCULOSKELETAL: Normal range of motion. No edema and no tenderness. 2+ distal pulses.   Pertinent Labs/Studies:   Results for orders placed or performed during the hospital encounter of 12/18/15 (from the past 72 hour(s))  CBC     Status: Abnormal   Collection Time: 12/18/15 10:10 AM  Result Value Ref Range   WBC 5.8 4.0 - 10.5 K/uL   RBC 3.73 (L) 3.87 - 5.11 MIL/uL   Hemoglobin 10.7 (L) 12.0 - 15.0 g/dL   HCT 32.5 (L) 36.0 - 46.0 %   MCV 87.1 78.0 - 100.0 fL   MCH 28.7 26.0 - 34.0 pg   MCHC 32.9 30.0 - 36.0 g/dL   RDW 14.2 11.5 - 15.5 %   Platelets 222 150 - 400 K/uL  RPR     Status: None   Collection Time: 12/18/15 10:10 AM  Result Value Ref Range   RPR Ser Ql Non Reactive Non Reactive    Comment: (NOTE) Performed At: Sarah [name] [address] [place], [place] [id number] Lindon Romp MD [id number]   Type and screen     Status: None   Collection Time: 12/18/15 10:10 AM  Result Value Ref Range   ABO/RH(D) O POS    Antibody Screen NEG    Sample Expiration 12/21/2015   ABO/Rh     Status: None   Collection Time: 12/18/15 10:10 AM  Result Value Ref Range   ABO/RH(D) O POS     Assessment and Plan :Sarah [name] is a 35 y.o. [id number] at [redacted]w[redacted]d being admitted being admitted for scheduled cesarean section with bilateral tubal ligation. The risks of cesarean section discussed with the patient included but were not limited to: bleeding which may require transfusion or  reoperation; infection which may require antibiotics; injury to bowel, bladder, ureters or other surrounding organs; injury to the fetus; need for additional procedures including hysterectomy in the event of a life-threatening hemorrhage; placental abnormalities wth subsequent pregnancies, incisional problems, thromboembolic phenomenon and other postoperative/anesthesia complications. The patient concurred with the proposed plan, giving informed written consent for the procedure. Patient has been NPO since last night she will remain NPO for procedure. Anesthesia and OR aware. Preoperative prophylactic antibiotics and SCDs ordered on call to the OR. To OR when ready.    Mora Pedraza L. Ihor Dow, MD, Cherlynn June  Attending [place], [place]

## 2015-12-20 NOTE — Progress Notes (Signed)
Pt moving / will retake

## 2015-12-20 NOTE — Transfer of Care (Signed)
Immediate Anesthesia Transfer of Care Note  Patient: Sarah [name]  Procedure(s) Performed: Procedure(s): CESAREAN SECTION WITH BILATERAL TUBAL LIGATION (N/A)  Patient Location: PACU  Anesthesia Type:Spinal  Level of Consciousness: awake  Airway & Oxygen Therapy: Patient Spontanous Breathing  Post-op Assessment: Report given to RN and Post -op Vital signs reviewed and stable  Post vital signs: stable  Last Vitals:  Filed Vitals:   12/20/15 1021 12/20/15 1022  BP: 137/117 137/87  Pulse: 68 87  Temp:    Resp:      Last Pain: There were no vitals filed for this visit.       Complications: No apparent anesthesia complications

## 2015-12-20 NOTE — Lactation Note (Signed)
This note was copied from a baby's chart. Lactation Consultation Note  Patient Name: Girl Mikayela Mentzel [id number] Date: 12/20/2015 Reason for consult: Initial assessment Baby at 6 hr of life and mom reports bf is going well. She denies breast or nipple pain, voiced no concerns. Mom was sleepy from medication so eduction may need to be repeated. Discussed baby behavior, feeding frequency, baby belly size, voids, wt loss, breast changes, and nipple care. Demonstrated manual expression, colostrum noted bilaterally, spoon in room. Given lactation handouts. Aware of OP services and support group.     Maternal Data Has patient been taught Hand Expression?: Yes Does the patient have breastfeeding experience prior to this delivery?: Yes  Feeding Feeding Type: Breast Fed Length of feed: 17 min  LATCH Score/Interventions Latch: Grasps breast easily, tongue down, lips flanged, rhythmical sucking.  Audible Swallowing: A few with stimulation  Type of Nipple: Everted at rest and after stimulation  Comfort (Breast/Nipple): Soft / non-tender     Hold (Positioning): No assistance needed to correctly position infant at breast.  LATCH Score: 9  Lactation Tools Discussed/Used WIC Program: No   Consult Status Consult Status: Follow-up Date: 12/21/15 Follow-up type: In-patient    Denzil Hughes 12/20/2015, 5:53 PM

## 2015-12-21 ENCOUNTER — Encounter (HOSPITAL_COMMUNITY): Payer: Self-pay | Admitting: Obstetrics & Gynecology

## 2015-12-21 LAB — CBC
HCT: 27.1 % — ABNORMAL LOW (ref 36.0–46.0)
HEMOGLOBIN: 9 g/dL — AB (ref 12.0–15.0)
MCH: 29 pg (ref 26.0–34.0)
MCHC: 33.2 g/dL (ref 30.0–36.0)
MCV: 87.4 fL (ref 78.0–100.0)
Platelets: 196 10*3/uL (ref 150–400)
RBC: 3.1 MIL/uL — AB (ref 3.87–5.11)
RDW: 14.2 % (ref 11.5–15.5)
WBC: 9.6 10*3/uL (ref 4.0–10.5)

## 2015-12-21 LAB — BIRTH TISSUE RECOVERY COLLECTION (PLACENTA DONATION)

## 2015-12-21 MED ORDER — LACTATED RINGERS IV BOLUS (SEPSIS)
1000.0000 mL | Freq: Once | INTRAVENOUS | Status: AC
  Administered 2015-12-21: 1000 mL via INTRAVENOUS

## 2015-12-21 NOTE — Anesthesia Postprocedure Evaluation (Signed)
Anesthesia Post Note  Patient: Sarah [name]  Procedure(s) Performed: Procedure(s) (LRB): CESAREAN SECTION WITH BILATERAL TUBAL LIGATION (N/A)  Patient location during evaluation: Mother Baby Anesthesia Type: Spinal Level of consciousness: awake Pain management: pain level controlled Vital Signs Assessment: post-procedure vital signs reviewed and stable Respiratory status: spontaneous breathing Cardiovascular status: stable Postop Assessment: no headache, no backache, spinal receding, patient able to bend at knees, no signs of nausea or vomiting and adequate PO intake Anesthetic complications: no     Last Vitals:  Filed Vitals:   12/21/15 0220 12/21/15 0740  BP: 130/71 112/64  Pulse: 78 67  Temp: 36.8 C 36.8 C  Resp: 20 18    Last Pain:  Filed Vitals:   12/21/15 0742  PainSc: 2    Pain Goal:                 Nekeya Briski

## 2015-12-21 NOTE — Progress Notes (Signed)
Subjective: Postpartum Day 1: Cesarean Delivery & BTL Has eaten graham crackers w/o difficulty- getting ready to order breakfast.  Drinking, ambulating well. Foley still in.  No flatus yet.  Lochia and pain wnl.  Denies dizziness, lightheadedness, or sob. No complaints.  Called overnight by RN for decreased urinary output, had total of 1.5L IVF bolus  Objective: Vital signs in last 24 hours: Temp:  [97.4 F (36.3 C)-98.5 F (36.9 C)] 98.3 F (36.8 C) (05/14 0220) Pulse Rate:  [58-87] 78 (05/14 0220) Resp:  [11-29] 20 (05/14 0220) BP: (77-141)/(49-117) 130/71 mmHg (05/14 0220) SpO2:  [93 %-100 %] 98 % (05/14 0220) Weight:  [97.977 kg (216 lb)] 97.977 kg (216 lb) (05/13 0959)   OUTPUT: Total urine output since surgery: 1050ml, EBL at time of surgery (400 over last 12 hrs=>50ml/hr), Emesis 26ml: 856ml= 1833 total Intake: 752ml po, 2,231ml IV= 3000 total  Physical Exam:  General: alert, cooperative and no distress Lochia: appropriate Uterine Fundus: firm Incision: healing well, no significant drainage, no dehiscence, no significant erythema. Abd binder in place DVT Evaluation: No evidence of DVT seen on physical exam.   Recent Labs  12/18/15 1010 12/21/15 0455  HGB 10.7* 9.0*  HCT 32.5* 27.1*    Assessment/Plan: Status post Cesarean section. Doing well postoperatively.  Continue current care. Breastfeeding S/P BTL  Sarah [name] 12/21/2015, 7:18 AM

## 2015-12-21 NOTE — Lactation Note (Signed)
This note was copied from a baby's chart. Lactation Consultation Note  Follow up visit made.  Baby is receiving double phototherapy.  Mom states baby has been latching but now fussy with latch attempts.  Assisted with cradle and football hold.  Baby showing good feeding cues but unable to sustain a latch.  24 mm nipple shield applied and baby nursed for 5 minutes then fell asleep.  Mom shown hand expression and breast massage.  No milk obtained with hand expression.  Recommended initiating pumping with DEBP to stimulate milk supply and provide extra calories.  Mom agreeable.  Discussed colostrum and milk coming to volume.  Mom obtained drops after pumping for 15 minutes.  Milk was finger fed to baby.  Baby continues to act hungry but when put to the breast she is fussy then falls asleep.  Discussed with parents the possible use of a SNS with formula.  Mom states she would like to wait and prefers baby receive breast milk only at this time. Mom will continue to attempt to latch baby and pump/hand express every 3 hours.  Patient Name: Sarah [name] [id number] Date: 12/21/2015 Reason for consult: Follow-up assessment;Difficult latch;Hyperbilirubinemia   Maternal Data    Feeding Feeding Type: Breast Fed Length of feed: 5 min  LATCH Score/Interventions Latch: Repeated attempts needed to sustain latch, nipple held in mouth throughout feeding, stimulation needed to elicit sucking reflex.  Audible Swallowing: None  Type of Nipple: Everted at rest and after stimulation  Comfort (Breast/Nipple): Soft / non-tender     Hold (Positioning): Assistance needed to correctly position infant at breast and maintain latch.  LATCH Score: 6  Lactation Tools Discussed/Used Tools: Nipple Shields Nipple shield size: 24 Pump Review: Setup, frequency, and cleaning;Milk Storage Initiated by:: [place] Date initiated:: 12/21/15   Consult Status Consult Status: Follow-up Date: 12/22/15 Follow-up type:  In-patient    Ave Filter 12/21/2015, 2:01 PM

## 2015-12-21 NOTE — Addendum Note (Signed)
Addendum  created 12/21/15 0803 by Ignacia Bayley, CRNA   Modules edited: Clinical Notes   Clinical Notes:  File: [id number]

## 2015-12-22 LAB — TYPE AND SCREEN
ABO/RH(D): O POS
ANTIBODY SCREEN: NEGATIVE
UNIT DIVISION: 0
Unit division: 0

## 2015-12-22 MED ORDER — COCONUT OIL OIL
TOPICAL_OIL | Status: AC
  Administered 2015-12-22: 1 via TOPICAL
  Filled 2015-12-22: qty 120

## 2015-12-22 MED ORDER — OXYCODONE HCL 10 MG PO TABS: 10.0000 mg | ORAL_TABLET | ORAL | Status: DC | PRN

## 2015-12-22 MED ORDER — IBUPROFEN 600 MG PO TABS: 600.0000 mg | ORAL_TABLET | Freq: Four times a day (QID) | ORAL | Status: DC

## 2015-12-22 NOTE — Discharge Summary (Signed)
OB Discharge Summary  Patient Name: Sarah [name] DOB: 1981/04/21 MRN: [id number]  Date of admission: 12/20/2015 Delivering MD: Lavonia Drafts   Date of discharge: 12/22/2015  Admitting diagnosis: cpt [id number] - REPEAT c-section x's 4 Intrauterine pregnancy: [redacted]w[redacted]d     Secondary diagnosis:Principal Problem:   Previous cesarean delivery, antepartum condition or complication Active Problems:   Anemia, iron deficiency   Supervision of high-risk pregnancy   Prior pregnancy with fetal demise   Status post repeat low transverse cesarean section  Additional problems:none     Discharge diagnosis: Term Pregnancy Delivered                                                                     Post partum procedures:none  Augmentation: none  Complications: None  Hospital course:  Sceduled C/S   34 y.o. yo [id number] at [redacted]w[redacted]d was admitted to the hospital 12/20/2015 for scheduled cesarean section with the following indication:Elective Repeat.  Membrane Rupture Time/Date: 11:12 AM ,12/20/2015   Patient delivered a Viable infant.12/20/2015  Details of operation can be found in separate operative note.  Pateint had an uncomplicated postpartum course.  She is ambulating, tolerating a regular diet, passing flatus, and urinating well. Patient is discharged home in stable condition on  12/22/2015          Physical exam  Filed Vitals:   12/21/15 0740 12/21/15 1200 12/21/15 1754 12/22/15 0551  BP: 112/64 121/65 130/65 126/67  Pulse: 67 68 80 86  Temp: 98.2 F (36.8 C) 98.4 F (36.9 C) 98.4 F (36.9 C) 98.3 F (36.8 C)  TempSrc:   Oral Oral  Resp: 18 20 18 18   Height:      Weight:      SpO2: 98% 100%     General: alert, cooperative and no distress Lochia: appropriate Uterine Fundus: firm Incision: Healing well with no significant drainage, No significant erythema DVT Evaluation: No evidence of DVT seen on physical exam. No cords or calf tenderness. No significant calf/ankle  edema. Labs: Lab Results  Component Value Date   WBC 9.6 12/21/2015   HGB 9.0* 12/21/2015   HCT 27.1* 12/21/2015   MCV 87.4 12/21/2015   PLT 196 12/21/2015   CMP Latest Ref Rng 12/26/2013  Glucose 70 - 99 mg/dL 95  BUN 6 - 23 mg/dL 11  Creatinine 0.50 - 1.10 mg/dL 0.74  Sodium 137 - 147 mEq/L 141  Potassium 3.7 - 5.3 mEq/L 4.5  Chloride 96 - 112 mEq/L 105  CO2 19 - 32 mEq/L 25  Calcium 8.4 - 10.5 mg/dL 8.9  Total Protein 6.0 - 8.3 g/dL 7.4  Total Bilirubin 0.3 - 1.2 mg/dL 0.3  Alkaline Phos 39 - 117 U/L 83  AST 0 - 37 U/L 29  ALT 0 - 35 U/L 28    Discharge instruction: per After Visit Summary and "Baby and Me Booklet".  After Visit Meds:    Medication List    ASK your doctor about these medications        acetaminophen 500 MG tablet  Commonly known as:  TYLENOL  Take 500 mg by mouth every 6 (six) hours as needed for moderate pain.     ONE-A-DAY WOMENS PRENATAL 1 28-0.8-235 MG Caps  Take 1 tablet by mouth. Reported on 10/23/2015        Diet: routine diet  Activity: Advance as tolerated. Pelvic rest for 6 weeks.   Outpatient follow up:6 weeks Follow up Appt:Future Appointments Date Time Provider [place]  01/28/2016 12:45 PM Woodroe Mode, MD [place]   Follow up visit: No Follow-up on file.  Postpartum contraception: Tubal Ligation  Newborn Data: Live born female  Birth Weight: 7 lb 1.6 oz (3220 g) APGAR: 9, 9  Baby Feeding: Bottle and Breast Disposition:home with mother   12/22/2015 Koren Shiver, CNM

## 2015-12-25 ENCOUNTER — Encounter (HOSPITAL_COMMUNITY): Payer: Managed Care, Other (non HMO)

## 2015-12-26 ENCOUNTER — Telehealth: Payer: Self-pay | Admitting: *Deleted

## 2015-12-26 NOTE — Telephone Encounter (Signed)
[place] called from [employer] re: Short term disability for maternity. Have been unsuccessful in reaching client. Want to verify date of delivery- when admitted- type of delivery.  Called [employer] and information given.

## 2015-12-30 NOTE — Telephone Encounter (Signed)
Opened in error

## 2016-01-01 ENCOUNTER — Encounter (HOSPITAL_COMMUNITY): Payer: Self-pay | Admitting: Obstetrics & Gynecology

## 2016-01-28 ENCOUNTER — Ambulatory Visit: Payer: Managed Care, Other (non HMO) | Admitting: Obstetrics & Gynecology

## 2016-03-24 ENCOUNTER — Encounter (HOSPITAL_COMMUNITY): Payer: Self-pay | Admitting: *Deleted

## 2016-03-24 ENCOUNTER — Emergency Department (HOSPITAL_COMMUNITY): Payer: Managed Care, Other (non HMO)

## 2016-03-24 ENCOUNTER — Emergency Department (HOSPITAL_COMMUNITY)
Admission: EM | Admit: 2016-03-24 | Discharge: 2016-03-24 | Disposition: A | Discharge status: Home / Self Care | Payer: Managed Care, Other (non HMO) | Attending: Emergency Medicine | Admitting: Emergency Medicine

## 2016-03-24 DIAGNOSIS — N390 Urinary tract infection, site not specified: Secondary | ICD-10-CM | POA: Diagnosis not present

## 2016-03-24 DIAGNOSIS — G35 Multiple sclerosis: Secondary | ICD-10-CM | POA: Diagnosis not present

## 2016-03-24 DIAGNOSIS — R200 Anesthesia of skin: Secondary | ICD-10-CM

## 2016-03-24 DIAGNOSIS — N3000 Acute cystitis without hematuria: Secondary | ICD-10-CM | POA: Diagnosis not present

## 2016-03-24 DIAGNOSIS — Z85820 Personal history of malignant melanoma of skin: Secondary | ICD-10-CM | POA: Insufficient documentation

## 2016-03-24 DIAGNOSIS — R208 Other disturbances of skin sensation: Secondary | ICD-10-CM | POA: Diagnosis not present

## 2016-03-24 DIAGNOSIS — R531 Weakness: Secondary | ICD-10-CM

## 2016-03-24 LAB — COMPREHENSIVE METABOLIC PANEL
ALT: 17 U/L (ref 14–54)
AST: 18 U/L (ref 15–41)
Albumin: 3.8 g/dL (ref 3.5–5.0)
Alkaline Phosphatase: 103 U/L (ref 38–126)
Anion gap: 8 (ref 5–15)
BUN: 8 mg/dL (ref 6–20)
CHLORIDE: 104 mmol/L (ref 101–111)
CO2: 27 mmol/L (ref 22–32)
CREATININE: 0.65 mg/dL (ref 0.44–1.00)
Calcium: 8.9 mg/dL (ref 8.9–10.3)
GFR calc Af Amer: >60 mL/min (ref >60)
GFR calc non Af Amer: >60 mL/min (ref >60)
GLUCOSE: 95 mg/dL (ref 65–99)
POTASSIUM: 3.5 mmol/L (ref 3.5–5.1)
SODIUM: 139 mmol/L (ref 135–145)
Total Bilirubin: 0.3 mg/dL (ref 0.3–1.2)
Total Protein: 7.5 g/dL (ref 6.5–8.1)

## 2016-03-24 LAB — CBC WITH DIFFERENTIAL/PLATELET
Basophils Absolute: 0 10*3/uL (ref 0.0–0.1)
Basophils Relative: 0 %
EOS ABS: 0.1 10*3/uL (ref 0.0–0.7)
EOS PCT: 2 %
HCT: 34.3 % — ABNORMAL LOW (ref 36.0–46.0)
Hemoglobin: 10.9 g/dL — ABNORMAL LOW (ref 12.0–15.0)
LYMPHS ABS: 2.3 10*3/uL (ref 0.7–4.0)
LYMPHS PCT: 36 %
MCH: 28.2 pg (ref 26.0–34.0)
MCHC: 31.8 g/dL (ref 30.0–36.0)
MCV: 88.9 fL (ref 78.0–100.0)
MONOS PCT: 8 %
Monocytes Absolute: 0.5 10*3/uL (ref 0.1–1.0)
Neutro Abs: 3.4 10*3/uL (ref 1.7–7.7)
Neutrophils Relative %: 54 %
PLATELETS: 296 10*3/uL (ref 150–400)
RBC: 3.86 MIL/uL — ABNORMAL LOW (ref 3.87–5.11)
RDW: 14 % (ref 11.5–15.5)
WBC: 6.3 10*3/uL (ref 4.0–10.5)

## 2016-03-24 LAB — URINALYSIS, ROUTINE W REFLEX MICROSCOPIC
BILIRUBIN URINE: NEGATIVE
GLUCOSE, UA: NEGATIVE mg/dL
Ketones, ur: NEGATIVE mg/dL
Nitrite: POSITIVE — AB
PROTEIN: NEGATIVE mg/dL
SPECIFIC GRAVITY, URINE: 1.027 (ref 1.005–1.030)
pH: 6 (ref 5.0–8.0)

## 2016-03-24 LAB — URINE MICROSCOPIC-ADD ON

## 2016-03-24 LAB — PREGNANCY, URINE: PREG TEST UR: NEGATIVE

## 2016-03-24 MED ORDER — DEXTROSE 5 % IV SOLN
1.0000 g | Freq: Once | INTRAVENOUS | Status: AC
  Administered 2016-03-24: 1 g via INTRAVENOUS
  Filled 2016-03-24: qty 10

## 2016-03-24 MED ORDER — GADOBENATE DIMEGLUMINE 529 MG/ML IV SOLN
15.0000 mL | Freq: Once | INTRAVENOUS | Status: AC
Start: 2016-03-24 — End: 2016-03-24
  Administered 2016-03-24: 15 mL via INTRAVENOUS

## 2016-03-24 MED ORDER — CEPHALEXIN 500 MG PO CAPS: 500.0000 mg | ORAL_CAPSULE | Freq: Two times a day (BID) | ORAL | 0 refills | Status: DC

## 2016-03-24 NOTE — ED Notes (Signed)
Patient transported to MRI 

## 2016-03-24 NOTE — Discharge Instructions (Signed)
You have a UTI. Please take antibiotics as prescribed.  You also have new MS lesions on your MRI. You expressed preference to follow-up with your neurologist regarding this and starting treatment as outpatient. Please Call Dr. Krista Blue tomorrow to set up follow up.  Return without fail for worsening symptoms, including fever, severe abdominal or back pain, intractable vomiting, worsening numbness/weakness, confusion or any other symptoms concerning to you.

## 2016-03-24 NOTE — ED Triage Notes (Signed)
Pt reports hx of MS and having a flare up. Pt having decreased feeling to bilateral hands and feet. Reports heaviness to her legs, unsteady gait.

## 2016-03-24 NOTE — ED Notes (Signed)
Pt back in room. Family transported to bathroom.

## 2016-03-24 NOTE — ED Notes (Signed)
EDP at bedside  

## 2016-03-24 NOTE — Consult Note (Addendum)
Neurology Consult Note  Reason for Consultation: Possible MS flare  Requesting provider: Brantley Stage, MD  CC: numbness in legs and fingers, trouble walking  HPI: This is a 35-yo RH woman with MS diagnosed in 2003, initially treated with Betaseron for several years. Last flare was in 2015 with numbness in the legs and right-sided weakness; she was treated with IV Solumedrol with resolution of symptoms. Most recently she was being treated with Gilenya and took this for about one year before she discovered that she was pregnant and stopped it approximately one year ago. She is now three months post-partum and has not resumed her Gilenya. She has not seen her neurologist yet post-partum and currently has no upcoming visit scheduled.   She presents today complaining of tingling in the first and second digits and the tips of digits 3-5 on both hands. She also has numbness in both legs with dizziness and balance impairment. She states that the tingling in her fingers has been going on for many months. She reports having an EMG/NCV for evaluation of possible carpal tunnel syndrome but says this was unrevealing. The symptoms in her legs have been present for about one month. Over the past week, her leg numbness and balance have gotten worse and today she was having difficulty ambulating without holding onto the walls. She has a rolling walker at home but says she does not use it "because of my pride."   She is currently breast-feeding. She states that her baby sleep through the night. She feels fairly rested and tries to sleep when she can. She denies any recent fever or illness. She has not had any falls. She denies any change in her vision.   PMH:  Past Medical History:  Diagnosis Date  . Headache   . Heart murmur   . Melanoma ([place]) 1996   on head  . Multiple sclerosis ([place])   . Multiple sclerosis ([facility])     PSH:  Past Surgical History:  Procedure Laterality Date  . CESAREAN SECTION     x4  .  CESAREAN SECTION WITH BILATERAL TUBAL LIGATION N/A 12/20/2015   Procedure: CESAREAN SECTION WITH BILATERAL TUBAL LIGATION;  Surgeon: Lavonia Drafts, MD;  Location: [facility];  Service: Obstetrics;  Laterality: N/A;  . MELANOMA EXCISION  age 78   on head  . TUBAL LIGATION  2017    Family history: Family History  Problem Relation Age of Onset  . Diabetes Mother   . Diabetes Maternal Uncle   . Hypertension Maternal Grandmother     Social history:  Social History   Social History  . Marital status: Divorced    Spouse name: N/A  . Number of children: N/A  . Years of education: N/A   Occupational History  . Not on file.   Social History Main Topics  . Smoking status: Never Smoker  . Smokeless tobacco: Never Used  . Alcohol use No  . Drug use: No  . Sexual activity: Yes   Other Topics Concern  . Not on file   Social History Narrative  . No narrative on file    Current outpatient meds: Current Meds  Medication Sig  . acetaminophen (TYLENOL) 500 MG tablet Take 500 mg by mouth every 6 (six) hours as needed.    Current inpatient meds:  Current Facility-Administered Medications  Medication Dose Route Frequency Provider Last Rate Last Dose  . cefTRIAXone (ROCEPHIN) 1 g in dextrose 5 % 50 mL IVPB  1 g Intravenous Once  Forde Dandy, MD       Current Outpatient Prescriptions  Medication Sig Dispense Refill  . acetaminophen (TYLENOL) 500 MG tablet Take 500 mg by mouth every 6 (six) hours as needed.      Allergies: No Known Allergies  ROS: As per HPI. A full 14-point review of systems was performed and is otherwise unremarkable.   PE:  BP 129/73   Pulse 78   Temp 98.6 F (37 C) (Oral)   Resp 19   Ht 5\' 5"  (1.651 m)   Wt 84.8 kg (187 lb)   SpO2 100%   BMI 31.12 kg/m   General: WDWN, no acute distress. AAO x4. Speech clear, no dysarthria. No aphasia. Follows commands briskly. Affect is bright with congruent mood. Comportment is normal.  HEENT:  Normocephalic. Lhermitte's sign absent. Neck supple without LAD. MMM, OP clear. Dentition good. Sclerae anicteric. No conjunctival injection.  CV: Regular, no murmur. Carotid pulses full and symmetric, no bruits. Distal pulses 2+ and symmetric.  Lungs: CTAB.  Abdomen: Soft, non-distended, non-tender. Bowel sounds present x4.  Extremities: No C/C/E. Neuro:  CN: Pupils are equal and round. They are symmetrically reactive from 3-->2 mm with the left pupil slightly sluggish. EOMI with breakup of smooth pursuits, no nystagmus. No reported diplopia. Facial sensation is intact to light touch. Face is symmetric at rest with normal strength and mobility. Hearing is intact to conversational voice. Palate elevates symmetrically and uvula is midline. Voice is normal in tone, pitch and quality. Bilateral SCM and trapezii are 5/5. Tongue is midline with normal bulk and mobility.  Motor: Normal bulk and tone throughout. She has normal strength in both arms. In the legs, she has 4-/5 strength but this is somewhat effort-dependent as she is able to perform heel-shin well after she lifts her foot onto the opposite shin. No tremor or other abnormal movements. No drift.  Sensation: Intact to light touch. Pinprick is reduced on the left side. She has poor vibration in BLE to the knee, worse on the left. Joint position is poor at both great toes. DTRs: 2+, symmetric. Toes downgoing on the left, upgoing on the right. Hoffman's is absent bilaterally.  Coordination: Finger-to-nose and heel-to-shin are without dysmetria. Finger taps are normal in amplitude and speed, no decrement.   Labs:  Lab Results  Component Value Date   WBC 6.3 03/24/2016   HGB 10.9 (L) 03/24/2016   HCT 34.3 (L) 03/24/2016   PLT 296 03/24/2016   GLUCOSE 95 03/24/2016   ALT 17 03/24/2016   AST 18 03/24/2016   NA 139 03/24/2016   K 3.5 03/24/2016   CL 104 03/24/2016   CREATININE 0.65 03/24/2016   BUN 8 03/24/2016   CO2 27 03/24/2016   UA-->  cloudy, trace Hgb, positive nitrite, moderate leukest, WBCs too numerous to count, many bacteria Upreg negative  Imaging:  I have personally and independently reviewed MRI of the brain with and without contrast from 12/07/13. This shows numerous ovoid lesions in the bihemispheric white matter that are both periventricular and subcortical in location. Additional lesions are noted in the cerebellar hemispheres and the left middle cerebellar peduncle. Several enhancing lesions are present, located in the left middle cerebellar peduncle, left frontal white matter, left parietal white matter, the left splenium of the corpus callosum, and both cerebellar hemispheres. The corpus callosum volume appears normal. No T1 holes are appreciated.   I have personally and independently reviewed the MRI of the cervical spine with and without contrast from  12/07/13. There are several patchy areas of increased signal within the cervical cord, the most prominent and well-defined of which is at C2-3. Additional areas are noted at C3-4, C5, and T1-2. There is a focal area of enhancement in the left lateral aspect of the cord at C5-6 with another possible subtle area of enhancement noted in the dorsal aspect of the cord at C6-7.   I have reviewed the radiologist's reports on both of these studies and agree.   Assessment and Plan:  1. Possible MS flare: The patient reports BLE numbness and difficulty walking symptoms which she says she has had with previous attacks. These symptoms have been present for about one month. I suspect these represent a pseudoflare, particularly given that she has an active UTI today. That said, she is has been off of her immunomodulating therapy for about one year now since she became pregnant. She is now three months post-partum and has not followed up with her neurologist yet. She has not had a recent MRI so I will check MRI brain and cervical spine with and without contrast to assess disease activity  and to see if she does have any new lesions. Regardless, she will need to have her UTI treated before she can start high-dose Solumedrol. If MRI does not show any new lesions, then symptoms would be expected to improve after this has been adequately treated, +/- PT if needed. Should new lesions be present, then IV Solumedrol 1000 mg daily for 3-5 days would be started after her UTI has resolved. This could likely be arranged through her outpatient neurologist.   2. BLE numbness: This likely represents a pseudoflare as noted above. She denies any pain with this so no direct intervention is necessary apart from PT if symptoms truly limit her ability to ambulate.   3. UTI: Urine culture is pending. She has gotten ceftriaxone here in the ED. I will defer management to ED MD.   4. MS: This is a long-standing diagnosis with her last reported flare in 2015. She is off of her Gilenya and last took this about one year ago, stopping it after she became pregnant. She needs to contact her neurologist to make an appointment as soon as possible to discuss resumption of her Gilenya.    This was discussed with the patient and she is in agreement with the plan as noted above. She was given the opportunity to ask questions and these were addressed to her satisfaction.   I also discussed the case with Dr. Oleta Mouse in the ED at the time of my visit to coordinate care.   Thank you for the opportunity to participate in Ms. Degollado's care. Please feel free to call with any questions or concerns.     Neurology Addendum:  MRI of the brain and cervical spine with and without contrast have been completed. I have personally reviewed these scans and compared them to her prior studies from 2015. There has been interval development of numerous new white matter lesions involving both cerebral hemispheres since her last scan. There are 5-6 enhancing lesions involving both cerebral hemispheres and the splenium of the corpus callosum. Her  cervical spine appears to be relatively stable with a single area of contrast enhancement in the dorsal aspect of the cord at C4.   I reviewed the scans with the patient and her significant other on PACS at the bedside. At this time, I recommend treating her UTI. Given the chronicity of her symptoms (LE sx's present  for about one month), there is little urgency to initiating steroid treatment right now. Due to the necessity of high dose IV steroids, it is prudent to ensure adequate treatment of infection first. She was counseled that it would be recommended for her to get back on immunomodulating therapy as soon as possible given the activity of her disease on imaging. She was advised to contact her treating outpatient neurologist to discuss this further and to hopefully help arrange outpatient steroid treatment once her UTI has been treated. She feels that she is OK to go home and will be able to take care of necessary activities with good support from her family and her significant other.   They are in agreement with the plan as noted. They were given the chance to ask any questions and these were addressed to their satisfaction.   I discussed the above with ED MD, Dr. Oleta Mouse, to coordinate care.   A total of 32 minutes was spent, 100% of which was reviewing imaging with and counseling the patient and coordination of care.

## 2016-03-24 NOTE — ED Provider Notes (Signed)
[facility] DEPT Provider Note   CSN: [id number] Arrival date & time: 03/24/16  1201     History   Chief Complaint Chief Complaint  Patient presents with  . Weakness    HPI Sarah [name] is a 35 y.o. female.  HPI 35 year old female who presents with lower extremity weakness, numbness in her hands and feet, dizziness, gait instability ongoing for one month that is consistent with her typical multiple sclerosis flare. She sees Dr. Ronny Flurry from [facility] neurology. States that she was recently pregnant, is now 3 months postpartum. She stopped taking her MS medications what during her pregnancy as she has had miscarriages in the past. Has not restarted her medications, and over the past month has had progressive symptoms consistent with that of her MS flare. Her gait instability, episodes of dizziness and diplopia has been worsening. No eye pain, fevers or chills, cough, difficulty breathing. Has noted some urinary incontinence which is consistent with that of her flare as well. No dysuria. No abdominal pain. Has had some diarrhea over the course of the month and a few episodes of vomiting 2 days ago that is now resolved. Past Medical History:  Diagnosis Date  . Headache   . Heart murmur   . Melanoma ([place]) 1996   on head  . Multiple sclerosis ([place])   . Multiple sclerosis [facility])     Patient Active Problem List   Diagnosis Date Noted  . Status post repeat low transverse cesarean section 12/20/2015  . History of melanoma 09/16/2015  . History of twin pregnancy in prior pregnancy 09/09/2015  . Late prenatal care affecting pregnancy in second trimester, antepartum 09/09/2015  . Supervision of high-risk pregnancy 08/22/2015  . Previous cesarean delivery, antepartum condition or complication 0000000  . Prior pregnancy with fetal demise 08/22/2015  . Multiple sclerosis ([place]) 08/22/2015  . Multiple sclerosis affecting pregnancy ([place]) 08/22/2015  . Anemia, iron deficiency 09/09/2012  .  Urinary incontinence 09/09/2012    Past Surgical History:  Procedure Laterality Date  . CESAREAN SECTION     x4  . CESAREAN SECTION WITH BILATERAL TUBAL LIGATION N/A 12/20/2015   Procedure: CESAREAN SECTION WITH BILATERAL TUBAL LIGATION;  Surgeon: Lavonia Drafts, MD;  Location: [facility];  Service: Obstetrics;  Laterality: N/A;  . MELANOMA EXCISION  age 7   on head  . TUBAL LIGATION  2017    OB History    Gravida Para Term Preterm AB Living   5 5 3 2  0 4   SAB TAB Ectopic Multiple Live Births   0 0 0 1 5       Home Medications    Prior to Admission medications   Medication Sig Start Date End Date Taking? Authorizing Provider  acetaminophen (TYLENOL) 500 MG tablet Take 500 mg by mouth every 6 (six) hours as needed.   Yes Historical Provider, MD  cephALEXin (KEFLEX) 500 MG capsule Take 1 capsule (500 mg total) by mouth 2 (two) times daily. 03/24/16   Forde Dandy, MD    Family History Family History  Problem Relation Age of Onset  . Diabetes Mother   . Diabetes Maternal Uncle   . Hypertension Maternal Grandmother     Social History Social History  Substance Use Topics  . Smoking status: Never Smoker  . Smokeless tobacco: Never Used  . Alcohol use No     Allergies   Review of patient's allergies indicates no known allergies.   Review of Systems Review of Systems 10/14 systems reviewed  and are negative other than those stated in the HPI   Physical Exam Updated Vital Signs BP 145/95   Pulse 78   Temp 98.6 F (37 C) (Oral)   Resp 19   Ht 5\' 5"  (1.651 m)   Wt 187 lb (84.8 kg)   SpO2 100%   BMI 31.12 kg/m   Physical Exam Physical Exam  Nursing note and vitals reviewed. Constitutional: Well developed, well nourished, non-toxic, and in no acute distress Head: Normocephalic and atraumatic.  Mouth/Throat: Oropharynx is clear and moist.  Neck: Normal range of motion. Neck supple.  Cardiovascular: Normal rate and regular rhythm.     Pulmonary/Chest: Effort normal and breath sounds normal.  Abdominal: Soft. There is no tenderness. There is no rebound and no guarding.  Musculoskeletal: Normal range of motion.  Skin: Skin is warm and dry.  Psychiatric: Cooperative Neurological:  Alert, oriented to person, place, time, and situation. Memory grossly in tact. Fluent speech. No dysarthria or aphasia.  Cranial nerves: Diplopia with lateral gaze bilaterally. Pupils are symmetric, and reactive to light. EOMI without nystagmus. No gaze deviation. Facial muscles symmetric with activation. Sensation to light touch over face in tact bilaterally. Hearing grossly in tact. Palate elevates symmetrically. Head turn and shoulder shrug are intact. Tongue midline.  Reflexes defered.  Muscle bulk and tone normal. No pronator drift. Barely antigravity in bilateral lower extremities Sensation to light touch is diminished in bilateral lower extremities from feet up to knees, over tips of all fingers bilaterally Coordination reveals no dysmetria with finger to nose.     ED Treatments / Results  Labs (all labs ordered are listed, but only abnormal results are displayed) Labs Reviewed  CBC WITH DIFFERENTIAL/PLATELET - Abnormal; Notable for the following:       Result Value   RBC 3.86 (*)    Hemoglobin 10.9 (*)    HCT 34.3 (*)    All other components within normal limits  URINALYSIS, ROUTINE W REFLEX MICROSCOPIC (NOT AT [facility]) - Abnormal; Notable for the following:    APPearance CLOUDY (*)    Hgb urine dipstick TRACE (*)    Nitrite POSITIVE (*)    Leukocytes, UA MODERATE (*)    All other components within normal limits  URINE MICROSCOPIC-ADD ON - Abnormal; Notable for the following:    Squamous Epithelial / LPF 0-5 (*)    Bacteria, UA MANY (*)    All other components within normal limits  URINE CULTURE  COMPREHENSIVE METABOLIC PANEL  PREGNANCY, URINE    EKG  EKG Interpretation None       Radiology Mr Jeri Cos Wo  Contrast  Result Date: 03/24/2016 CLINICAL DATA:  35 y/o F; lower extremity weakness, numbness in hands and feet, dizziness, and gait instability for 1 month with history of multiple sclerosis. Currently 3 months postpartum. The patient stopped taking her MS medications during pregnancy. EXAM: MRI HEAD WITHOUT AND WITH CONTRAST MRI CERVICAL SPINE WITHOUT AND WITH CONTRAST TECHNIQUE: Multiplanar, multiecho pulse sequences of the brain and surrounding structures, and cervical spine, to include the craniocervical junction and cervicothoracic junction, were obtained without and with intravenous contrast. CONTRAST:  53mL MULTIHANCE GADOBENATE DIMEGLUMINE 529 MG/ML IV SOLN COMPARISON:  MRI brain dated 12/07/2013 and MRI of cervical spine dated 09/09/2012. FINDINGS: MRI HEAD FINDINGS Brain: There are greater than 20 foci of T2 FLAIR hyperintensity, several in periventricular white matter with radially oriented configuration to the ventricles, multiple juxta cortical lesions in the frontal and parietal lobes, a small focus within  the left cerebral peduncle, and a focus within the left brachium pontis. In comparison with the 2015 MRI of the brain there are new juxta cortical and subcortical lesions in the frontal and parietal lobes and new large periventricular foci in both the frontal lobes and parietal lobes bilaterally (series 8 image 20, 19, 17, 15, 14, 13). Additionally, there are small lesions within the corpus callosum. Optic nerves appear small in size bilaterally and may be atrophic, question of increased signal in the right intraorbital right optic nerve series 9, image 21. The new lesion within the left frontal subcortical white matter demonstrates diffusion restriction, series 7, image 25. 5-10 lesions demonstrate enhancement seen in the left frontal and right parietal subcortical white matter, bifrontal and biparietal periventricular white matter, and along the right aspect of the corpus callosum. No abnormal  susceptibility hypointensity to indicate intracranial hemorrhage. Extra-axial space: Normal ventricular size. No midline shift. No effacement of basilar cisterns. No extra-axial collection is identified. Proximal intracranial flow voids are maintained. No abnormality of the cervical medullary junction. Other: No abnormal signal of the paranasal sinuses. No abnormal signal of the mastoid air cells. Orbits are unremarkable. Calvarium is unremarkable. MRI CERVICAL SPINE FINDINGS Alignment: Straightening of cervical lordosis without listhesis. Vertebrae: No fracture, evidence of discitis, or bone lesion. Cord: New enhancing enhancing cord lesion within the right dorsal: At the C4. No additional focus of abnormal enhancement is identified. There are multiple stable foci of T2 hyperintensity with the largest lesions at C2-3, C5-6, and C7-T1. Posterior Fossa, vertebral arteries, paraspinal tissues: Small foci of T2 hyperintensity in the right cerebellum and left brachium pontis compatible demyelinating plaques. No abnormal enhancement of the posterior fossa. Disc levels: Mild disc desiccation and disc space narrowing from C2-3 C5. No significant disc displacement, foraminal narrowing, or canal stenosis. IMPRESSION: 1. Several new enhancing lesions in periventricular and subcortical white matter as well as the splenium of corpus callosum with single diffusion restricting lesion and left frontal subcortical white matter in comparison with the prior MRI of the brain consistent with active demyelination. 2. New enhancing cord lesion at C4 in the dorsal column consistent with active demyelination. 3. Several additional stable supratentorial, infratentorial, and cervical demyelinating plaques again noted. 4. Probable atrophy of the optic nerves bilaterally and question of increased T2 signal in the right optic nerve which may represent sequelae of demyelination. Electronically Signed   By: Kristine Garbe M.D.   On:  03/24/2016 22:47   Mr Cervical Spine W Wo Contrast  Result Date: 03/24/2016 CLINICAL DATA:  34 y/o F; lower extremity weakness, numbness in hands and feet, dizziness, and gait instability for 1 month with history of multiple sclerosis. Currently 3 months postpartum. The patient stopped taking her MS medications during pregnancy. EXAM: MRI HEAD WITHOUT AND WITH CONTRAST MRI CERVICAL SPINE WITHOUT AND WITH CONTRAST TECHNIQUE: Multiplanar, multiecho pulse sequences of the brain and surrounding structures, and cervical spine, to include the craniocervical junction and cervicothoracic junction, were obtained without and with intravenous contrast. CONTRAST:  41mL MULTIHANCE GADOBENATE DIMEGLUMINE 529 MG/ML IV SOLN COMPARISON:  MRI brain dated 12/07/2013 and MRI of cervical spine dated 09/09/2012. FINDINGS: MRI HEAD FINDINGS Brain: There are greater than 20 foci of T2 FLAIR hyperintensity, several in periventricular white matter with radially oriented configuration to the ventricles, multiple juxta cortical lesions in the frontal and parietal lobes, a small focus within the left cerebral peduncle, and a focus within the left brachium pontis. In comparison with the 2015 MRI of the brain  there are new juxta cortical and subcortical lesions in the frontal and parietal lobes and new large periventricular foci in both the frontal lobes and parietal lobes bilaterally (series 8 image 20, 19, 17, 15, 14, 13). Additionally, there are small lesions within the corpus callosum. Optic nerves appear small in size bilaterally and may be atrophic, question of increased signal in the right intraorbital right optic nerve series 9, image 21. The new lesion within the left frontal subcortical white matter demonstrates diffusion restriction, series 7, image 25. 5-10 lesions demonstrate enhancement seen in the left frontal and right parietal subcortical white matter, bifrontal and biparietal periventricular white matter, and along the right  aspect of the corpus callosum. No abnormal susceptibility hypointensity to indicate intracranial hemorrhage. Extra-axial space: Normal ventricular size. No midline shift. No effacement of basilar cisterns. No extra-axial collection is identified. Proximal intracranial flow voids are maintained. No abnormality of the cervical medullary junction. Other: No abnormal signal of the paranasal sinuses. No abnormal signal of the mastoid air cells. Orbits are unremarkable. Calvarium is unremarkable. MRI CERVICAL SPINE FINDINGS Alignment: Straightening of cervical lordosis without listhesis. Vertebrae: No fracture, evidence of discitis, or bone lesion. Cord: New enhancing enhancing cord lesion within the right dorsal: At the C4. No additional focus of abnormal enhancement is identified. There are multiple stable foci of T2 hyperintensity with the largest lesions at C2-3, C5-6, and C7-T1. Posterior Fossa, vertebral arteries, paraspinal tissues: Small foci of T2 hyperintensity in the right cerebellum and left brachium pontis compatible demyelinating plaques. No abnormal enhancement of the posterior fossa. Disc levels: Mild disc desiccation and disc space narrowing from C2-3 C5. No significant disc displacement, foraminal narrowing, or canal stenosis. IMPRESSION: 1. Several new enhancing lesions in periventricular and subcortical white matter as well as the splenium of corpus callosum with single diffusion restricting lesion and left frontal subcortical white matter in comparison with the prior MRI of the brain consistent with active demyelination. 2. New enhancing cord lesion at C4 in the dorsal column consistent with active demyelination. 3. Several additional stable supratentorial, infratentorial, and cervical demyelinating plaques again noted. 4. Probable atrophy of the optic nerves bilaterally and question of increased T2 signal in the right optic nerve which may represent sequelae of demyelination. Electronically Signed    By: Kristine Garbe M.D.   On: 03/24/2016 22:47    Procedures Procedures (including critical care time)  Medications Ordered in ED Medications  cefTRIAXone (ROCEPHIN) 1 g in dextrose 5 % 50 mL IVPB (0 g Intravenous Stopped 03/24/16 2030)  gadobenate dimeglumine (MULTIHANCE) injection 15 mL (15 mLs Intravenous Contrast Given 03/24/16 2213)     Initial Impression / Assessment and Plan / ED Course  I have reviewed the triage vital signs and the nursing notes.  Pertinent labs & imaging results that were available during my care of the patient were reviewed by me and considered in my medical decision making (see chart for details).  Clinical Course   Spoke with Dr. Leonel Ramsay 18:40 regarding concern for MS flare. He will see patient in ED and give recommendations. She is with UTI that plays role in symptoms today. No fever, systemic signs or symptoms of illness, or leukocytosis.6 given a dose of IV ceftriaxone while in the ED. Neurology did evaluate this patient and recommended MRI of the cervical spine and brain, which shows new MS lesions. Dr. Shon Hale evaluated patient. Patient at this time requested to be discharged home with UTI treatments to defer treatment for MS flare at this time  which Dr. Shon Hale felt was appropriate. She'll be discharged with course of Keflex. She will call Dr. name in the office tomorrow to set up close follow-up appointment to discuss ongoing treatment for her MS flare. Strict return and follow-up instructions are reviewed. She expressed understanding of all discharge instructions, and felt comfortable to plan of care  Final Clinical Impressions(s) / ED Diagnoses   Final diagnoses:  Bilateral leg numbness  Weakness  Acute cystitis without hematuria  Multiple sclerosis (HCC)    New Prescriptions New Prescriptions   CEPHALEXIN (KEFLEX) 500 MG CAPSULE    Take 1 capsule (500 mg total) by mouth 2 (two) times daily.     Forde Dandy, MD 03/24/16 [phone]

## 2016-03-24 NOTE — ED Notes (Signed)
MD at bedside. 

## 2016-03-27 LAB — URINE CULTURE: Culture: >=100000 — AB

## 2016-03-28 ENCOUNTER — Telehealth (HOSPITAL_BASED_OUTPATIENT_CLINIC_OR_DEPARTMENT_OTHER): Payer: Self-pay

## 2016-03-28 NOTE — Telephone Encounter (Signed)
Post ED Visit - Positive Culture Follow-up  Culture report reviewed by antimicrobial stewardship pharmacist:  []  Elenor Quinones, Pharm.D. []  Heide Guile, Pharm.D., BCPS []  Parks Neptune, Pharm.D. []  Alycia Rossetti, Pharm.D., BCPS []  [place], Pharm.D., BCPS, AAHIVP []  Legrand Como, Pharm.D., BCPS, AAHIVP []  Cassie Stewart, Pharm.D. []  Stephens November, Pharm.D. Rachel Rumbarger Pharm D Positive urine culture Treated with Cephalexin, organism sensitive to the same and no further patient follow-up is required at this time.  Genia Del 03/28/2016, 11:25 AM

## 2016-03-29 ENCOUNTER — Encounter (HOSPITAL_BASED_OUTPATIENT_CLINIC_OR_DEPARTMENT_OTHER): Payer: Self-pay | Admitting: Emergency Medicine

## 2016-03-31 ENCOUNTER — Encounter: Payer: Self-pay | Admitting: Neurology

## 2016-03-31 ENCOUNTER — Ambulatory Visit (INDEPENDENT_AMBULATORY_CARE_PROVIDER_SITE_OTHER): Payer: Managed Care, Other (non HMO) | Admitting: Neurology

## 2016-03-31 ENCOUNTER — Other Ambulatory Visit: Payer: Self-pay | Admitting: Neurology

## 2016-03-31 VITALS — BP 151/87 | HR 65 | Ht 65.0 in | Wt 191.2 lb

## 2016-03-31 DIAGNOSIS — R269 Unspecified abnormalities of gait and mobility: Secondary | ICD-10-CM | POA: Diagnosis not present

## 2016-03-31 DIAGNOSIS — G35 Multiple sclerosis: Secondary | ICD-10-CM | POA: Diagnosis not present

## 2016-03-31 MED ORDER — METHYLPREDNISOLONE 4 MG PO TBPK: ORAL_TABLET | ORAL | 0 refills | Status: DC

## 2016-03-31 NOTE — Progress Notes (Signed)
PATIENT: Sarah [name] DOB: [birth year]  Chief Complaint  Patient presents with  . Multiple Sclerosis    She is here with her significant other, Sarah [name] and her 88-week old daughter. She was last seen 12/07/13.  She has been Gilenya for two years.  Prior to [place], she was on Betaseron then Copaxone.  Recently, she has been experiencing numbness in various areas of her body, unsteadiness and blurred vision.  Her symptoms prompted her to seek treatment in the ED.  Her scans showed disease progression.  She would like to discuss treatment options.  She is currently being treated for her UTI.     HISTORICAL  Sarah [name] is a 35 years old right-handed female, accompanied by her significant other's Sarah [name] and her daughter at today's clinical visit on March 31 2016  She had a history of relapsing remitting multiple sclerosis, the diagnosis was made in 2000 no 2, she presented with right optic neuritis, initially was treated at [facility] with steroid, later Betaseron, she did very well but she had a stillborn at week 26 while using Betaseron before and during pregnancy, she later had more pregnant first born in December 2004, second born August 2000 no 6, third child in April 2009 with twins, Betaseron was on hold during her pregnancy.  She had a major flareup in summer of 2007, she had 4 extremity paresthesia, weakness, difficulty walking, she was again put on steroid and Betaseron transiently, she could not tolerate the injection due to flulike illness, nausea or vomiting, and the cost factor. Betaseron was stopped,  Third major flareup was in January 2011, she had significant weakness, fall and paresthesia, difficulty walking driving for 2 months, Sarah [name] was discussed with her at that time, but because worried the side effect of PML, it was not started,  Another flareup was in February 2014, she had right eye pain, blurry vision, right arm and leg weakness, increased gait abnormality, constipation,  bladder incontinence, somewhat responsive to steroid,  MRI of the brain in 2015 showed multiple bilateral periventricular and subcortical MS lesions, MRI of cervical spine also demonstrate increased T2 at cervical cord at C2-3, C5, left-sided lesion at C7. MRI of the thoracic spine showed diffuse cord abnormality at T1-T9, focal enhancement at ventral surface of the cord at T5-6, extending cephalocaudal fourth 12 mm,  JC virus antibody was negative with titer of 0.19 in May 2015, she was enrolled in research study was treated with Copaxone in April 2014, later she was rolled over to [place] since early 2014,   her gait difficulty has much improved with Gilenya treatment,, but still with baseline bilateral lower extremity spasticity and paresthesia, last office visit was in May 2015, she has developed lower back cellulitis in April 2015, she was treated with antibiotics, she has stopped Gilenya for about 2 weeks, then had another flareup of aggressive worsening gait abnormality, right arm and leg weakness, paresthesia, also progressive worsening bilateral visual difficulty, severely restricted visual field, decreased visual acuity. During last office visit in May 2015, She was found to have severely restricted bilateral visual field, decreased visual acuity, counting fingers with right eye, left eye could only read large print, mild spastic quadriplegia, right worse than left, with motor strength of 4/5, ambulate with a wide based unsteady gait  She was sent to hospital admission in May 2015, her symptoms has much improved with high-dose of IV steroid followed by physical therapy, she has lost follow-ups since. 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 June 2015    She was pregnant since September 2016,  had elective C-section on Dec 20 2015,  with healthy baby.  She presented to emergency room on March 24 2016 for  worsening bilateral hands feet numbness, unsteady gait, weakness, she started to have worsening paresthesia, bladder urgency, incontinence, increased right eye blurry postpartum 6 week around June 2017, progressively worsened  I have personally reviewed repeat MRI of the brain, cervical spine with and without contrast in August 2017, in comparison with 2015, new enhancing nations in periventricular and subcortical white matter, splenium of corpus callosum consistent with active demyelinating, new enhancing cord lesion at C4 in the dorsal:   REVIEW OF SYSTEMS: Full 14 system review of systems performed and notable only for as above  ALLERGIES: No Known Allergies  HOME MEDICATIONS: Current Outpatient Prescriptions  Medication Sig Dispense Refill  . acetaminophen (TYLENOL) 500 MG tablet Take 500 mg by mouth every 6 (six) hours as needed.    . cephALEXin (KEFLEX) 500 MG capsule Take 1 capsule (500 mg total) by mouth 2 (two) times daily. 14 capsule 0   No current facility-administered medications for this visit.     PAST MEDICAL HISTORY: Past Medical History:  Diagnosis Date  . Headache   . Heart murmur   . Melanoma ([place]) 1996   on head  . Multiple sclerosis ([place])   . Multiple sclerosis ([place])     PAST SURGICAL HISTORY: Past Surgical History:  Procedure Laterality Date  . CESAREAN SECTION     x4  . CESAREAN SECTION WITH BILATERAL TUBAL LIGATION N/A 12/20/2015   Procedure: CESAREAN SECTION WITH BILATERAL TUBAL LIGATION;  Surgeon: Lavonia Drafts, MD;  Location: [facility];  Service: Obstetrics;  Laterality: N/A;  . MELANOMA EXCISION  age 26   on head  . TUBAL LIGATION  2017    FAMILY HISTORY: Family History  Problem Relation Age of Onset  . Diabetes Mother   . Diabetes Maternal Uncle   . Hypertension Maternal Grandmother     SOCIAL HISTORY:  Social History   Social History  .  Marital status: Divorced    Spouse name: N/A  . Number of children: N/A  . Years of education: N/A   Occupational History  . Not on file.   Social History Main Topics  . Smoking status: Never Smoker  . Smokeless tobacco: Never Used  . Alcohol use No  . Drug use: No  . Sexual activity: Yes   Other Topics Concern  . Not on file   Social History Narrative  . No narrative on file     PHYSICAL EXAM   Vitals:   03/31/16 1305  BP: (!) 151/87  Pulse: 65  Weight: 191 lb 4 oz (86.8 kg)  Height: 5\' 5"  (1.651 m)    Not recorded      Body mass index is 31.83 kg/m.  PHYSICAL EXAMNIATION:  Gen: NAD, conversant, well nourised, obese, well groomed                     Cardiovascular: Regular rate rhythm, no peripheral edema, warm, nontender. Eyes: Conjunctivae clear without exudates or hemorrhage Neck: Supple, no carotid bruise. Pulmonary: Clear to auscultation bilaterally   NEUROLOGICAL EXAM:  MENTAL STATUS: Speech:    Speech is normal; fluent and spontaneous with normal comprehension.  Cognition:     Orientation to time, place and person     Normal recent and remote memory     Normal Attention span and concentration     Normal Language, naming, repeating,spontaneous speech     Fund of knowledge   CRANIAL NERVES: CN II: Visual fields are full to confrontation. Fundoscopic exam is normal with sharp discs with mild bilateral optic disc atrophy, right visual acuity 20/70, left 20/20. Pupils are round equal and briskly reactive to light. CN III, IV, VI: extraocular movement are normal. No ptosis. CN V: Facial sensation is intact to pinprick in all 3 divisions bilaterally. Corneal responses are intact.  CN VII: Face is symmetric with normal eye closure and smile. CN VIII: Hearing is normal to rubbing fingers CN IX, X: Palate elevates symmetrically. Phonation is normal. CN XI: Head turning and shoulder shrug are intact CN XII: Tongue is midline with normal movements and no  atrophy.  MOTOR: Right arm pronation drift, moderate bilateral lower extremity spasticity,Mild right arm and bilateral lower extremity weakness  REFLEXES: Reflexes are 3 and symmetric at the biceps, triceps, knees, and ankles. Plantar responses are flexor.  SENSORY: Length dependent sensory loss light touch, pinprick, positional sensation and vibratory sensation are intact in fingers and toes.  COORDINATION: Rapid alternating movements and fine finger movements are intact. There is no dysmetria on finger-to-nose and heel-knee-shin.    GAIT/STANCE: She needs push up to get up from seated position, stiff unsteady, dragging her right leg more,    DIAGNOSTIC DATA (LABS, IMAGING, TESTING) - I reviewed patient records, labs, notes, testing and imaging myself where available.   ASSESSMENT AND PLAN  YAREN BARIENTOS is a 35 y.o. female   Relapsing remediating multiple sclerosis Elective C-section in May 2017, MS exacerbation with worsening gait abnormality, worsening right visual acuity  Medrol pack  Laboratory evaluations including JC virus antibody,   I have discussed with patient, we will proceed with Sarah [name] infusion    Sarah [name], M.D. Ph.D.  [facility] [address] [place], [place] [address] Ph: [phone] Fax: [phone]  CC: Referring Provider

## 2016-04-01 ENCOUNTER — Telehealth: Payer: Self-pay | Admitting: Neurology

## 2016-04-01 LAB — THYROID PANEL WITH TSH
FREE THYROXINE INDEX: 1.5 (ref 1.2–4.9)
T3 UPTAKE RATIO: 22 % — AB (ref 24–39)
T4, Total: 6.6 ug/dL (ref 4.5–12.0)
TSH: 0.842 u[IU]/mL (ref 0.450–4.500)

## 2016-04-01 LAB — VARICELLA ZOSTER ANTIBODY, IGG: VARICELLA: 1015 {index} (ref >165)

## 2016-04-01 LAB — VITAMIN B12: Vitamin B-12: 415 pg/mL (ref 211–946)

## 2016-04-01 LAB — FERRITIN: FERRITIN: 55 ng/mL (ref 15–150)

## 2016-04-01 NOTE — Telephone Encounter (Signed)
Patient returned call, please call [phone].

## 2016-04-01 NOTE — Telephone Encounter (Signed)
Left message to patient to discuss treatment options.

## 2016-04-01 NOTE — Telephone Encounter (Signed)
Have talked with patient, she is interested in MS research trial,  I have asked her to come in for IV Solu-Medrol 1000 mg IV for 3 days,

## 2016-04-06 LAB — STRATIFY JCV AB (W/ INDEX) W/ RFLX
Index Value: 0.31 — ABNORMAL HIGH
JCV Antibody: UNDETERMINED — AB

## 2016-04-06 LAB — RFLX STRATIFY JCV (TM) AB INHIBITION: JCV Antibody by Inhibition: NEGATIVE

## 2016-04-08 ENCOUNTER — Telehealth: Payer: Self-pay | Admitting: Neurology

## 2016-04-08 NOTE — Telephone Encounter (Signed)
Pt called requesting MRI order be sent to [facility]. She works there.  Fax: [phone], Phone: [phone], pt phone's .

## 2016-04-15 ENCOUNTER — Telehealth: Payer: Self-pay | Admitting: *Deleted

## 2016-04-15 NOTE — Telephone Encounter (Signed)
Pt form faxed to [facility] to [employer] [phone].

## 2016-04-27 ENCOUNTER — Encounter (INDEPENDENT_AMBULATORY_CARE_PROVIDER_SITE_OTHER): Payer: Self-pay | Admitting: Neurology

## 2016-04-27 ENCOUNTER — Encounter: Payer: Self-pay | Admitting: Neurology

## 2016-04-27 DIAGNOSIS — Z0289 Encounter for other administrative examinations: Secondary | ICD-10-CM

## 2016-04-27 DIAGNOSIS — G35 Multiple sclerosis: Secondary | ICD-10-CM

## 2016-04-27 NOTE — Progress Notes (Signed)
She is here for the screening visit for the Ofatumumab vs. Aubagio study.

## 2016-04-28 ENCOUNTER — Other Ambulatory Visit: Payer: Self-pay | Admitting: Neurology

## 2016-04-28 DIAGNOSIS — G35 Multiple sclerosis: Secondary | ICD-10-CM

## 2016-05-12 ENCOUNTER — Ambulatory Visit
Admission: RE | Admit: 2016-05-12 | Discharge: 2016-05-12 | Disposition: A | Discharge status: Home / Self Care | Payer: No Typology Code available for payment source | Source: Ambulatory Visit | Attending: Neurology | Admitting: Neurology

## 2016-05-12 DIAGNOSIS — G35 Multiple sclerosis: Secondary | ICD-10-CM

## 2016-05-20 ENCOUNTER — Encounter: Payer: Self-pay | Admitting: Neurology

## 2016-05-20 ENCOUNTER — Other Ambulatory Visit: Payer: Self-pay | Admitting: *Deleted

## 2016-05-20 ENCOUNTER — Other Ambulatory Visit (INDEPENDENT_AMBULATORY_CARE_PROVIDER_SITE_OTHER): Payer: Self-pay

## 2016-05-20 DIAGNOSIS — G35 Multiple sclerosis: Secondary | ICD-10-CM

## 2016-05-20 DIAGNOSIS — Z79899 Other long term (current) drug therapy: Secondary | ICD-10-CM

## 2016-05-20 DIAGNOSIS — Z0289 Encounter for other administrative examinations: Secondary | ICD-10-CM

## 2016-05-21 ENCOUNTER — Encounter: Payer: Self-pay | Admitting: Neurology

## 2016-05-21 LAB — CBC WITH DIFFERENTIAL/PLATELET
BASOS ABS: 0 10*3/uL (ref 0.0–0.2)
Basos: 0 %
EOS (ABSOLUTE): 0.2 10*3/uL (ref 0.0–0.4)
Eos: 2 %
Hematocrit: 32.6 % — ABNORMAL LOW (ref 34.0–46.6)
Hemoglobin: 10.8 g/dL — ABNORMAL LOW (ref 11.1–15.9)
Immature Grans (Abs): 0 10*3/uL (ref 0.0–0.1)
Immature Granulocytes: 0 %
LYMPHS ABS: 2 10*3/uL (ref 0.7–3.1)
Lymphs: 32 %
MCH: 29 pg (ref 26.6–33.0)
MCHC: 33.1 g/dL (ref 31.5–35.7)
MCV: 87 fL (ref 79–97)
MONOCYTES: 7 %
MONOS ABS: 0.5 10*3/uL (ref 0.1–0.9)
NEUTROS PCT: 59 %
Neutrophils Absolute: 3.7 10*3/uL (ref 1.4–7.0)
Platelets: 278 10*3/uL (ref 150–379)
RBC: 3.73 x10E6/uL — AB (ref 3.77–5.28)
RDW: 14.2 % (ref 12.3–15.4)
WBC: 6.4 10*3/uL (ref 3.4–10.8)

## 2016-05-24 ENCOUNTER — Other Ambulatory Visit: Payer: Self-pay | Admitting: Neurology

## 2016-05-24 DIAGNOSIS — G35 Multiple sclerosis: Secondary | ICD-10-CM

## 2016-05-25 ENCOUNTER — Ambulatory Visit
Admission: RE | Admit: 2016-05-25 | Discharge: 2016-05-25 | Disposition: A | Discharge status: Home / Self Care | Payer: No Typology Code available for payment source | Source: Ambulatory Visit | Attending: Neurology | Admitting: Neurology

## 2016-05-25 DIAGNOSIS — G35 Multiple sclerosis: Secondary | ICD-10-CM

## 2016-05-25 MED ORDER — GADOBENATE DIMEGLUMINE 529 MG/ML IV SOLN
18.0000 mL | Freq: Once | INTRAVENOUS | Status: AC | PRN
  Administered 2016-05-25: 18 mL via INTRAVENOUS

## 2016-05-27 ENCOUNTER — Telehealth: Payer: Self-pay | Admitting: *Deleted

## 2016-05-27 NOTE — Telephone Encounter (Signed)
Labs collected 05/20/16:  JCV - negative

## 2016-06-03 ENCOUNTER — Ambulatory Visit: Payer: Managed Care, Other (non HMO) | Admitting: Neurology

## 2016-08-12 IMAGING — US US MFM FETAL BPP W/O NON-STRESS
1 series · 15 of 16 positions shown · non-contrast
Comparison: none

[Series 1: us mfm fetal bpp w/o non-stress · 16 acquisitions, 15 frames shown]
[im 1/16]
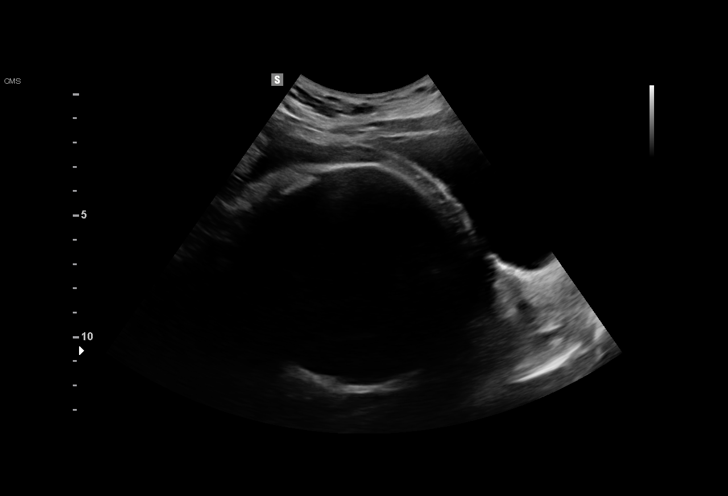
[im 2/16]
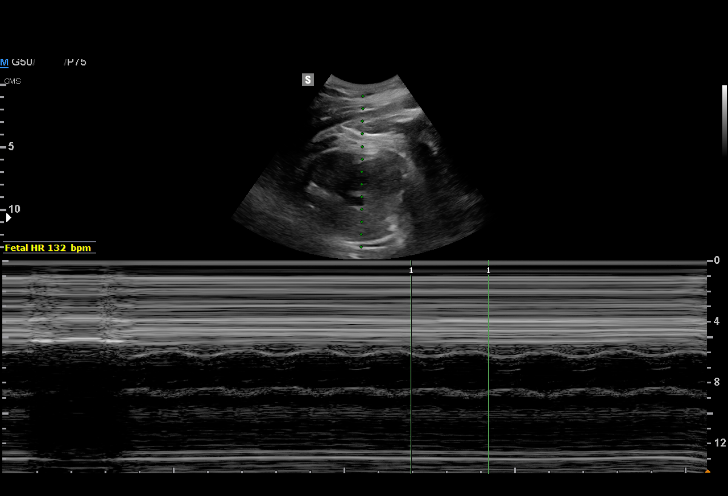
[im 3/16]
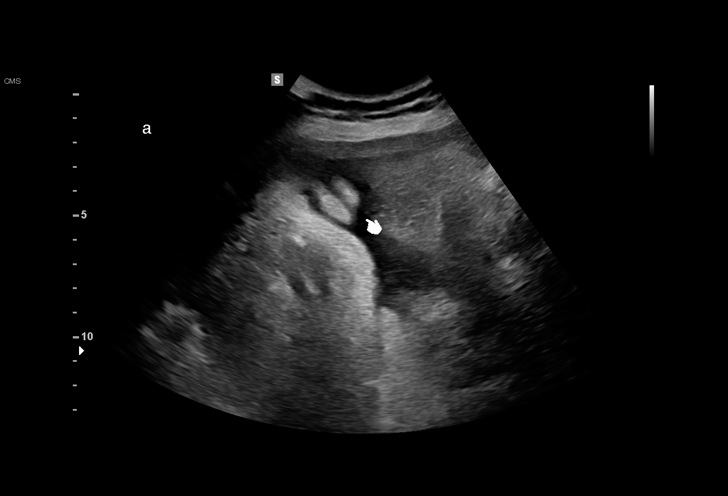
[im 4/16]
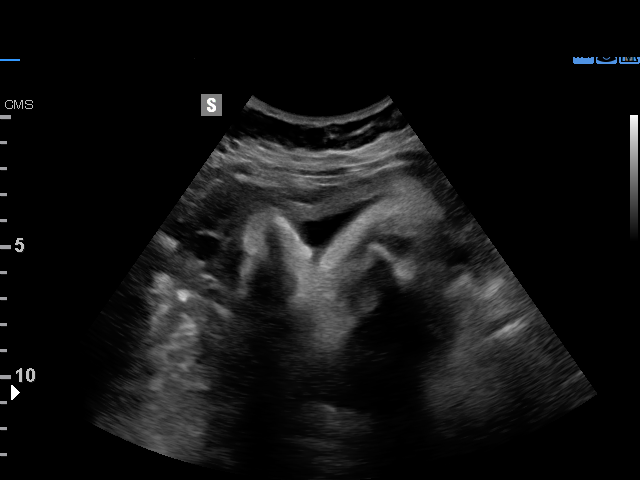
[im 5/16]
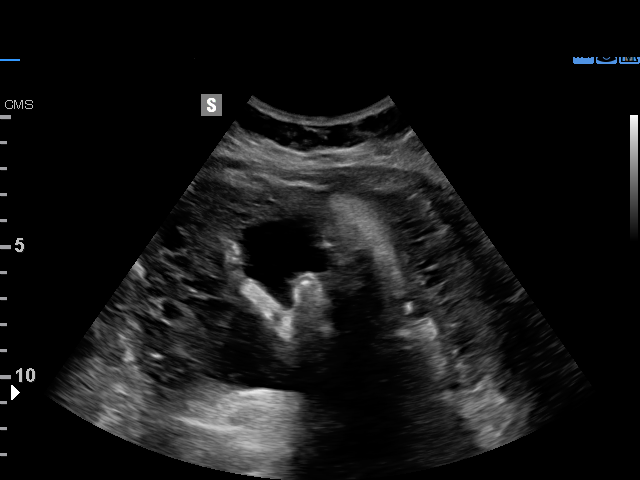
[im 6/16]
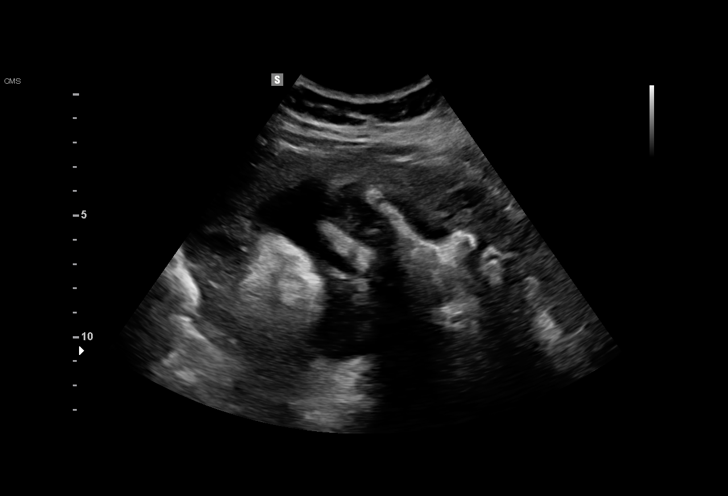
[im 7/16]
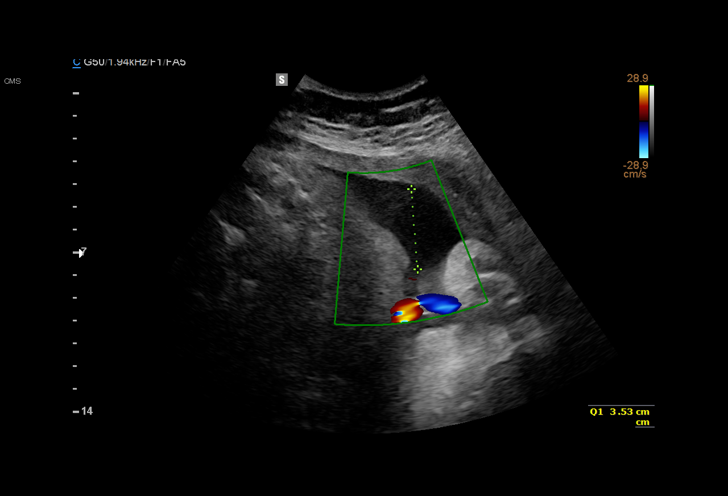
[im 9/16]
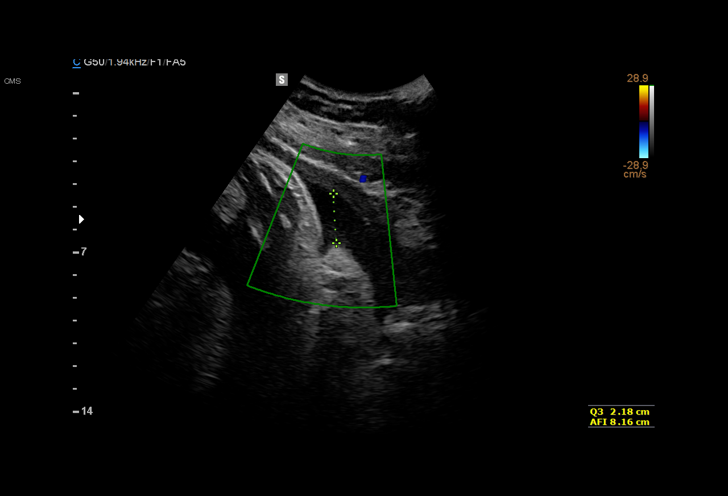
[im 10/16]
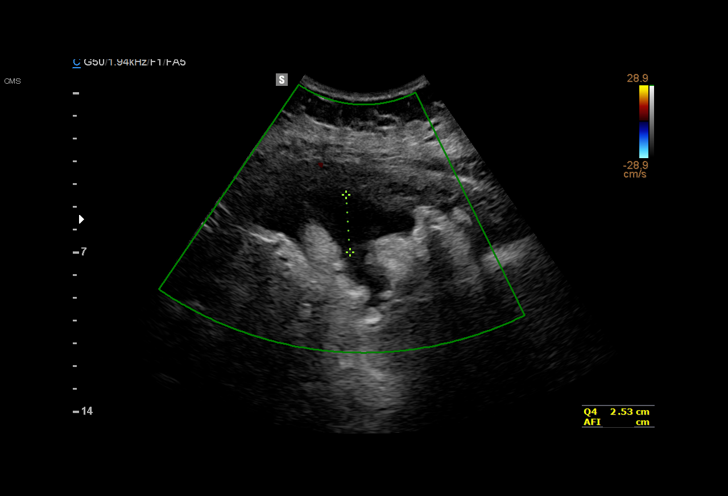
[im 11/16]
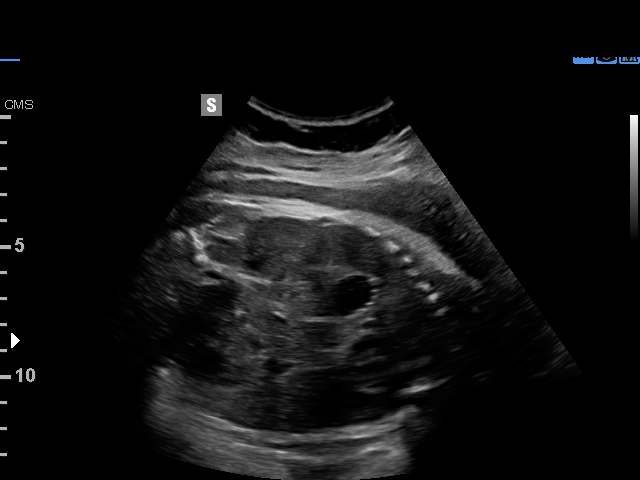
[im 12/16]
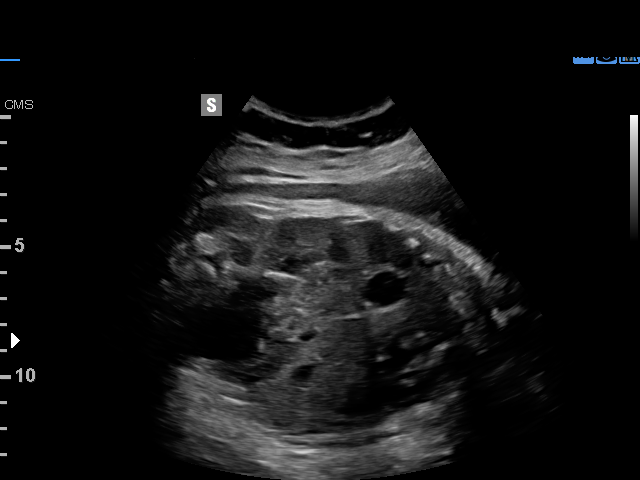
[im 13/16]
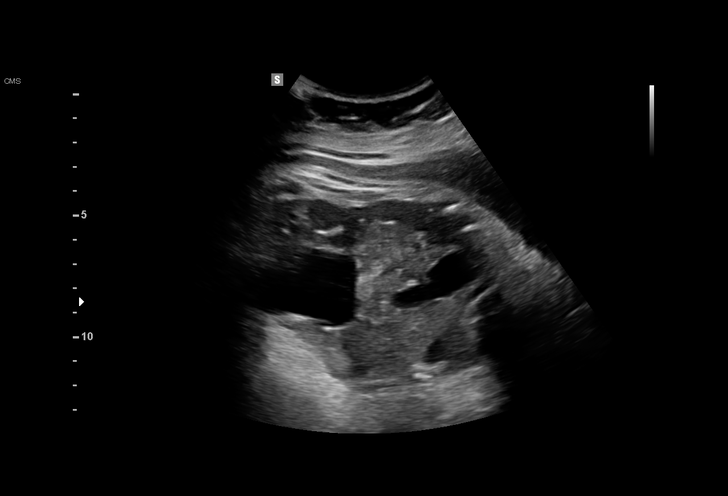
[im 14/16]
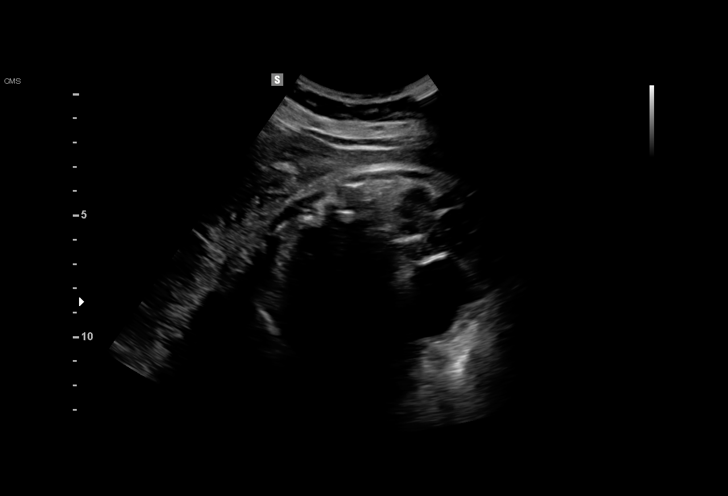
[im 15/16]
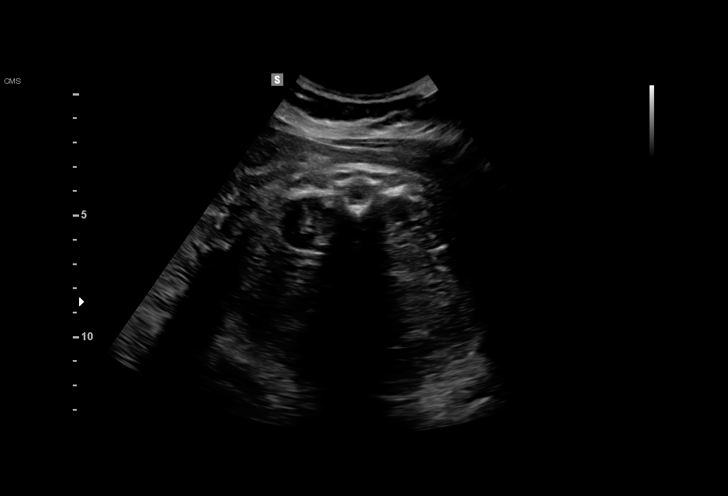
[im 16/16]
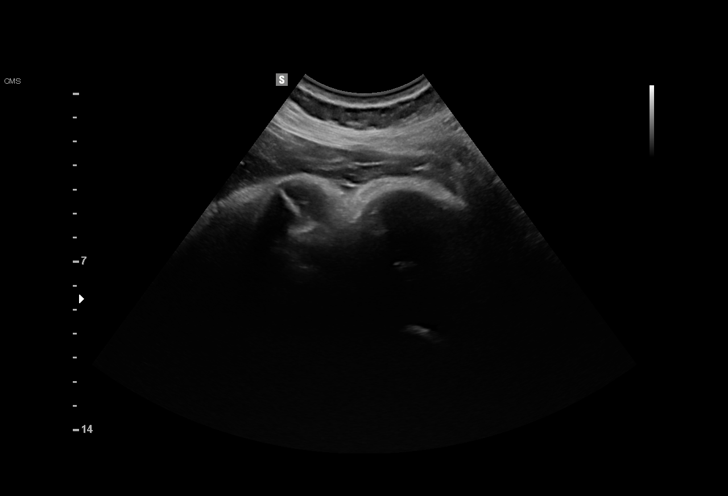

[15 of 16 positions shown; findings below may reference images not displayed]

Hospital Clinic-
Faculty Physician
OB/Gyn Clinic
Ref. Address:     [REDACTED]

Indications

37 weeks gestation of pregnancy
Poor obstetric history: Previous IUFD (22
weeks)
Poor obstetric history: Previous preterm
delivery (36 weeks, twins)
Poor obstetric history: Previous fetal growth
restriction (twins)
Previous cesarean delivery x3, antepartum
Obesity complicating pregnancy, third
trimester
Multiple sclerosis                             NVV.D55 G35
OB History

Gravidity:    5         Term:   2        Prem:   2         SAB:   0
TOP:          0       Ectopic:  0        Living: 4
Fetal Evaluation

Num Of Fetuses:     1
Fetal Heart         132
Rate(bpm):
Cardiac Activity:   Observed
Presentation:       Cephalic
Amniotic Fluid
AFI FV:      Subjectively within normal limits

AFI Sum(cm)     %Tile       Largest Pocket(cm)
10.69           30

RUQ(cm)       RLQ(cm)       LUQ(cm)        LLQ(cm)
3.53
Biophysical Evaluation

Amniotic F.V:   Within normal limits       F. Tone:         Observed
F. Movement:    Observed                   Score:           [DATE]
F. Breathing:   Observed
Gestational Age

LMP:           37w 5d        Date:  03/22/15                 EDD:    12/27/15
Best:          37w 5d     Det. By:  LMP  (03/22/15)          EDD:    12/27/15
Impression

SIUP at 37+5 weeks
Cephalic presentation
Normal amniotic fluid volume
BPP [DATE]
Recommendations

Continue twice weekly NSTs with weekly AFIs or weekly
BPPs until delivery

## 2016-08-18 ENCOUNTER — Encounter (INDEPENDENT_AMBULATORY_CARE_PROVIDER_SITE_OTHER): Payer: Self-pay | Admitting: Neurology

## 2016-08-18 DIAGNOSIS — Z0289 Encounter for other administrative examinations: Secondary | ICD-10-CM

## 2016-10-13 ENCOUNTER — Telehealth: Payer: Self-pay | Admitting: Neurology

## 2016-10-13 MED ORDER — HYOSCYAMINE SULFATE 0.125 MG PO TABS: 0.1250 mg | ORAL_TABLET | Freq: Three times a day (TID) | ORAL | 3 refills | Status: DC | PRN

## 2016-10-13 NOTE — Telephone Encounter (Signed)
Sarah [name] is in today for a research visit study. She has noted abdominal cramping with diarrhea and more urinary urgency  I will have her try hyoscyamine 0.125 mg 3 times a day and sent in a prescription.

## 2016-10-19 ENCOUNTER — Other Ambulatory Visit: Payer: Self-pay | Admitting: Neurology

## 2016-10-19 MED ORDER — OXYBUTYNIN CHLORIDE 5 MG PO TABS: 5.0000 mg | ORAL_TABLET | Freq: Two times a day (BID) | ORAL | 5 refills | Status: DC

## 2016-10-19 NOTE — Progress Notes (Signed)
She reported to [place] that the Hyoscyamine was expensive.   I will have her try ditropan

## 2016-11-10 ENCOUNTER — Ambulatory Visit: Payer: Self-pay | Admitting: Neurology

## 2016-11-10 ENCOUNTER — Encounter: Payer: Self-pay | Admitting: Neurology

## 2016-11-15 ENCOUNTER — Ambulatory Visit: Payer: Self-pay | Admitting: Neurology

## 2016-11-15 ENCOUNTER — Encounter: Payer: Self-pay | Admitting: Neurology

## 2016-11-16 ENCOUNTER — Encounter: Payer: Self-pay | Admitting: Neurology

## 2016-12-13 ENCOUNTER — Telehealth: Payer: Self-pay | Admitting: Neurology

## 2016-12-13 ENCOUNTER — Other Ambulatory Visit: Payer: Self-pay | Admitting: Neurology

## 2016-12-13 DIAGNOSIS — G35 Multiple sclerosis: Secondary | ICD-10-CM

## 2016-12-13 NOTE — Telephone Encounter (Signed)
Patient came in through research, complains of worsening gait abnormality,  double vision for one week, especial since May fourth 2018.  Check UA, Solu-Medrol 1000 mg IV every day for 3 days, followed by by mouth steroids tapering for relapsing remitting multiple sclerosis flareup.

## 2016-12-14 ENCOUNTER — Telehealth: Payer: Self-pay | Admitting: Neurology

## 2016-12-14 NOTE — Telephone Encounter (Signed)
Put her on schedule for office visit of MS treatment options after iv solumedrol

## 2016-12-14 NOTE — Telephone Encounter (Signed)
She has been added to Dr. Rhea Belton schedule to discuss medication options for her MS.

## 2016-12-16 ENCOUNTER — Telehealth: Payer: Self-pay | Admitting: Neurology

## 2016-12-16 ENCOUNTER — Encounter: Payer: Self-pay | Admitting: Neurology

## 2016-12-16 ENCOUNTER — Ambulatory Visit (INDEPENDENT_AMBULATORY_CARE_PROVIDER_SITE_OTHER): Payer: 59 | Admitting: Neurology

## 2016-12-16 VITALS — BP 123/70 | HR 81

## 2016-12-16 DIAGNOSIS — G35 Multiple sclerosis: Secondary | ICD-10-CM

## 2016-12-16 NOTE — Telephone Encounter (Signed)
I have started Solu-Medrol infusion since Dec 14 2016,  She continue have significant gait abnormality post the third day IV infusion on Dec 16 2016, will extending fusion to 5 days, she will continue IV infusion on May 11, May 14, will reevaluate patient then, may consider extending to total 7 days of IV Solu-Medrol 1000 mg

## 2016-12-16 NOTE — Progress Notes (Signed)
PATIENT: Sarah [name] DOB: Apr 03, 1981  Chief Complaint  Patient presents with  . Multiple Sclerosis    She is here with her significant other, Ryan.  She would like to discuss alternate medication options.     HISTORICAL  Sarah [name] is a 36 years old right-handed female, accompanied by her significant other's Thurmond Butts and her daughter at today's clinical visit on March 31 2016  She had a history of relapsing remitting multiple sclerosis, the diagnosis was made in 2002, she presented with right optic neuritis, initially was treated at [facility] with steroid, later Betaseron, she did very well but she had a stillborn at week 52 while using Betaseron before and during pregnancy, she later had more pregnant first born in December 2004, second born August 2006, third child in April 2009 with twins, Betaseron was on hold during her pregnancy.  She had a major flareup in summer of 2007, she had 4 extremity paresthesia, weakness, difficulty walking, she was again put on steroid and Betaseron transiently, she could not tolerate the injection due to flulike illness, nausea or vomiting, and the cost factor. Betaseron was stopped,  Third major flareup was in January 2011, she had significant weakness, fall and paresthesia, difficulty walking driving for 2 months, [place]ane Dee was discussed with her at that time, but because worried the side effect of PML, it was not started,  She was not on any treatment between 2011 and 2014.  Another flareup was in February 2014, she had right eye pain, blurry vision, right arm and leg weakness, increased gait abnormality, constipation, bladder incontinence, somewhat responsive to steroid,  MRI of the brain in 2015 showed multiple bilateral periventricular and subcortical MS lesions, MRI of cervical spine also demonstrate increased T2 at cervical cord at C2-3, C5, left-sided lesion at C7. MRI of the thoracic spine showed diffuse cord abnormality at T1-T9, focal  enhancement at ventral surface of the cord at T5-6, extending cephalocaudal fourth 12 mm,  JC virus antibody was negative with titer of 0.19 in May 2015, she was enrolled in research study was treated with Copaxone in April 2014, later she was rolled over to [place] since early 2014,   Her gait difficulty has much improved with Gilenya treatment,, but still with baseline bilateral lower extremity spasticity and paresthesia, last office visit was in May 2015, she has developed lower back cellulitis in April 2015, she was treated with antibiotics, she has stopped Gilenya for about 2 weeks, then had another flareup of aggressive worsening gait abnormality, right arm and leg weakness, paresthesia, also progressive worsening bilateral visual difficulty, severely restricted visual field, decreased visual acuity. During last office visit in May 2015, She was found to have severely restricted bilateral visual field, decreased visual acuity, counting fingers with right eye, left eye could only read large print, mild spastic quadriplegia, right worse than left, with motor strength of 4/5, ambulate with a wide based unsteady gait  She was sent to hospital admission in May 2015, her symptoms has much improved with high-dose of IV steroid followed by physical therapy, she has lost follow-ups since. 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 June 2015    She was pregnant since September 2016,  had elective C-section on Dec 20 2015, with healthy girl  She presented to emergency room on March 24 2016 for  worsening bilateral  hands feet numbness, unsteady gait, weakness, she started to have worsening paresthesia, bladder urgency, incontinence, increased right eye blurry postpartum 6 week around June 2017, progressively worsened  I have personally reviewed  repeat MRI of the brain, cervical spine with and without contrast in August 2017, in comparison with 2015, new enhancing nations in periventricular and subcortical white matter, splenium of corpus callosum consistent with active demyelinating, new enhancing cord lesion at C4 in the dorsal.  She was enrolled into MS search since Oct 2017, we have reviewed most recent MRI, in comparison to MRI of the brain with and without contrast in August 2017, and 2015, there was increase supratentorium lesion load, there was also 5 foci enhanced, which was not present in MRI of the brain in August 2017.  MRI of the cervical spine in August 2017 showed new enhancing cord lesion at C4,  She started research medication subcutaneous (CD20 monoclonal antibody) every month plus pill (Abulgia).   She reported significant change at the end of April 2018, she developed double vision, dizziness, ataxic gait, she began IV Solu-Medrol infusion 1000 mg daily since Dec 14 2016, complete 3 days today, about 2 weeks after symptoms onset without significant improvement,  She denies signs of infection, reviewed laboratory evaluation showed hemoglobin 10 point 5, creatinine 0.71, cholesterol 156, triglycerides 193, UA on November 15 2016 was negative  Because patient active relapsing remitting disease course, continued relapse in research trial, we have discussed with patient, she will withdraw from current treatment for research,  Laboratory evaluation today, start preauthorization with [place] today  REVIEW OF SYSTEMS: Full 14 system review of systems performed and notable only for as above  ALLERGIES: No Known Allergies  HOME MEDICATIONS: Current Outpatient Prescriptions  Medication Sig Dispense Refill  . acetaminophen (TYLENOL) 500 MG tablet Take 500 mg by mouth every 6 (six) hours as needed.     No current facility-administered medications for this visit.     PAST MEDICAL HISTORY: Past Medical History:  Diagnosis  Date  . Headache   . Heart murmur   . Melanoma ([place]) 1996   on head  . Multiple sclerosis ([place])   . Multiple sclerosis ([place])     PAST SURGICAL HISTORY: Past Surgical History:  Procedure Laterality Date  . CESAREAN SECTION     x4  . CESAREAN SECTION WITH BILATERAL TUBAL LIGATION N/A 12/20/2015   Procedure: CESAREAN SECTION WITH BILATERAL TUBAL LIGATION;  Surgeon: Lavonia Drafts, MD;  Location: [facility];  Service: Obstetrics;  Laterality: N/A;  . MELANOMA EXCISION  age 43   on head  . TUBAL LIGATION  2017    FAMILY HISTORY: Family History  Problem Relation Age of Onset  . Diabetes Mother   . Diabetes Maternal Uncle   . Hypertension Maternal Grandmother     SOCIAL HISTORY:  Social History   Social History  . Marital status: Divorced    Spouse name: N/A  . Number of children: N/A  . Years of education: N/A   Occupational History  . Not on file.   Social History Main Topics  . Smoking status: Never Smoker  . Smokeless tobacco: Never Used  . Alcohol use No  . Drug use: No  . Sexual activity: Yes   Other Topics Concern  . Not on file   Social History Narrative  . No narrative on file     PHYSICAL EXAM   Vitals:   12/16/16 1202  BP: 123/70  Pulse: 81    Not  recorded      There is no height or weight on file to calculate BMI.  PHYSICAL EXAMNIATION:  Gen: NAD, conversant, well nourised, obese, well groomed                     Cardiovascular: Regular rate rhythm, no peripheral edema, warm, nontender. Eyes: Conjunctivae clear without exudates or hemorrhage Neck: Supple, no carotid bruise. Pulmonary: Clear to auscultation bilaterally   NEUROLOGICAL EXAM:  MENTAL STATUS: Speech:    Speech is normal; fluent and spontaneous with normal comprehension.  Cognition:     Orientation to time, place and person     Normal recent and remote memory     Normal Attention span and concentration     Normal Language, naming,  repeating,spontaneous speech     Fund of knowledge   CRANIAL NERVES: CN II: Visual fields are full to confrontation.  CN III, IV, VI: Limited right eye abduction, nystagmus on extreme gaze CN V: Facial sensation is intact to pinprick in all 3 divisions bilaterally. Corneal responses are intact.  CN VII: Face is symmetric with normal eye closure and smile. CN VIII: Hearing is normal to rubbing fingers CN IX, X: Palate elevates symmetrically. Phonation is normal. CN XI: Head turning and shoulder shrug are intact CN XII: Tongue is midline with normal movements and no atrophy.  MOTOR: Mild bilateral lower chunky spasticity, mild bilateral hip flexion weakness  REFLEXES: Reflexes are 3 and symmetric at the biceps, triceps, knees, and ankles. Plantar responses are flexor.  SENSORY: Length dependent sensory loss light touch, pinprick, positional sensation and vibratory sensation are intact in fingers and toes.  COORDINATION: Rapid alternating movements and fine finger movements are intact. There is no dysmetria on finger-to-nose and heel-knee-shin.    GAIT/STANCE: She has significant trunk ataxia, limb rebound, wide-based unsteady gait, could not perform tandem walk   DIAGNOSTIC DATA (LABS, IMAGING, TESTING) - I reviewed patient records, labs, notes, testing and imaging myself where available.   ASSESSMENT AND PLAN  Sharmaine Bain Robotham is a 36 y.o. female   Relapsing remediating multiple sclerosis, MS exacerbation with worsening gait abnormality  since end of April 2018  Laboratory evaluations  Continue Solu-Medrol IV infusion, for total 5-7 days  Laboratory evaluations including JC virus antibody,   We have explained and explored them as treatment, decided to proceed with Amalia Hailey, M.D. Ph.D.  [facility] [address], [place], [place] [address] Ph: [phone] Fax: [phone]  CC: Referring Provider

## 2016-12-17 ENCOUNTER — Telehealth: Payer: Self-pay | Admitting: Neurology

## 2016-12-17 DIAGNOSIS — E0590 Thyrotoxicosis, unspecified without thyrotoxic crisis or storm: Secondary | ICD-10-CM

## 2016-12-17 LAB — CBC WITH DIFFERENTIAL/PLATELET
BASOS: 0 %
Basophils Absolute: 0 10*3/uL (ref 0.0–0.2)
EOS (ABSOLUTE): 0 10*3/uL (ref 0.0–0.4)
EOS: 0 %
HEMATOCRIT: 32.4 % — AB (ref 34.0–46.6)
HEMOGLOBIN: 10.5 g/dL — AB (ref 11.1–15.9)
IMMATURE GRANULOCYTES: 0 %
Immature Grans (Abs): 0 10*3/uL (ref 0.0–0.1)
Lymphocytes Absolute: 1.4 10*3/uL (ref 0.7–3.1)
Lymphs: 8 %
MCH: 28 pg (ref 26.6–33.0)
MCHC: 32.4 g/dL (ref 31.5–35.7)
MCV: 86 fL (ref 79–97)
MONOS ABS: 1 10*3/uL — AB (ref 0.1–0.9)
Monocytes: 5 %
NEUTROS PCT: 87 %
Neutrophils Absolute: 15.7 10*3/uL — ABNORMAL HIGH (ref 1.4–7.0)
Platelets: 349 10*3/uL (ref 150–379)
RBC: 3.75 x10E6/uL — ABNORMAL LOW (ref 3.77–5.28)
RDW: 14.5 % (ref 12.3–15.4)
WBC: 18.1 10*3/uL — ABNORMAL HIGH (ref 3.4–10.8)

## 2016-12-17 LAB — COMPREHENSIVE METABOLIC PANEL
ALBUMIN: 4.3 g/dL (ref 3.5–5.5)
ALT: 19 IU/L (ref 0–32)
AST: 19 IU/L (ref 0–40)
Albumin/Globulin Ratio: 1.5 (ref 1.2–2.2)
Alkaline Phosphatase: 86 IU/L (ref 39–117)
BUN / CREAT RATIO: 15 (ref 9–23)
BUN: 11 mg/dL (ref 6–20)
Bilirubin Total: <0.2 mg/dL (ref 0.0–1.2)
CALCIUM: 9.1 mg/dL (ref 8.7–10.2)
CO2: 21 mmol/L (ref 18–29)
CREATININE: 0.75 mg/dL (ref 0.57–1.00)
Chloride: 104 mmol/L (ref 96–106)
GFR calc Af Amer: 120 mL/min/{1.73_m2} (ref >59)
GFR, EST NON AFRICAN AMERICAN: 104 mL/min/{1.73_m2} (ref >59)
GLOBULIN, TOTAL: 2.8 g/dL (ref 1.5–4.5)
Glucose: 105 mg/dL — ABNORMAL HIGH (ref 65–99)
Potassium: 3.6 mmol/L (ref 3.5–5.2)
SODIUM: 142 mmol/L (ref 134–144)
Total Protein: 7.1 g/dL (ref 6.0–8.5)

## 2016-12-17 LAB — HEPATITIS C ANTIBODY: Hep C Virus Ab: <0.1 s/co ratio (ref 0.0–0.9)

## 2016-12-17 LAB — TSH: TSH: 0.274 u[IU]/mL — ABNORMAL LOW (ref 0.450–4.500)

## 2016-12-17 LAB — HEPATITIS B SURFACE ANTIBODY,QUALITATIVE: Hep B Surface Ab, Qual: REACTIVE

## 2016-12-17 LAB — HEPATITIS B SURFACE ANTIGEN: Hepatitis B Surface Ag: NEGATIVE

## 2016-12-17 NOTE — Telephone Encounter (Signed)
Please call patient, TSH was mildly decreased which could indicate hyperthyroidism, will repeat TSH thyroid panel a week after she finished steroid treatment, elevated WBC could be due to steroid treatment  Mild decreased hemoglobin 10 point 5, she should take over-the-counter ferrous sulfate supplements

## 2016-12-19 LAB — QUANTIFERON IN TUBE
QFT TB AG MINUS NIL VALUE: 0 [IU]/mL
QUANTIFERON MITOGEN VALUE: 0.13 IU/mL
QUANTIFERON NIL VALUE: 0.02 [IU]/mL
QUANTIFERON TB AG VALUE: 0.02 [IU]/mL
QUANTIFERON TB GOLD: UNDETERMINED — AB

## 2016-12-19 LAB — QUANTIFERON TB GOLD ASSAY (BLOOD)

## 2016-12-20 ENCOUNTER — Encounter: Payer: Self-pay | Admitting: *Deleted

## 2016-12-20 ENCOUNTER — Other Ambulatory Visit: Payer: Self-pay | Admitting: *Deleted

## 2016-12-20 DIAGNOSIS — E0590 Thyrotoxicosis, unspecified without thyrotoxic crisis or storm: Secondary | ICD-10-CM

## 2016-12-20 NOTE — Telephone Encounter (Signed)
She is aware of results and will start the recommended supplement.  She has a pending appt on 12/29/16 with Dr. Krista Blue and will have repeat labs drawn at that time.

## 2016-12-21 ENCOUNTER — Other Ambulatory Visit: Payer: Self-pay | Admitting: Neurology

## 2016-12-21 DIAGNOSIS — G35 Multiple sclerosis: Secondary | ICD-10-CM

## 2016-12-22 ENCOUNTER — Other Ambulatory Visit (INDEPENDENT_AMBULATORY_CARE_PROVIDER_SITE_OTHER): Payer: Self-pay

## 2016-12-22 DIAGNOSIS — Z0289 Encounter for other administrative examinations: Secondary | ICD-10-CM

## 2016-12-22 DIAGNOSIS — E0590 Thyrotoxicosis, unspecified without thyrotoxic crisis or storm: Secondary | ICD-10-CM

## 2016-12-22 DIAGNOSIS — G35 Multiple sclerosis: Secondary | ICD-10-CM

## 2016-12-23 ENCOUNTER — Telehealth: Payer: Self-pay | Admitting: Neurology

## 2016-12-23 ENCOUNTER — Encounter: Payer: Self-pay | Admitting: Neurology

## 2016-12-23 DIAGNOSIS — R899 Unspecified abnormal finding in specimens from other organs, systems and tissues: Secondary | ICD-10-CM

## 2016-12-23 LAB — HEPATITIS B CORE ANTIBODY, TOTAL: HEP B C TOTAL AB: NEGATIVE

## 2016-12-23 LAB — TSH: TSH: 0.328 u[IU]/mL — ABNORMAL LOW (ref 0.450–4.500)

## 2016-12-23 NOTE — Telephone Encounter (Signed)
Please call patient, QuantiFERON TB code antibody testing was indeterminant,  I have ordered a chest x-ray to rule out pulmonary TB, she does not need appointment, can directly go to [facility] imaging on [address] to have chest x-ray

## 2016-12-27 ENCOUNTER — Telehealth: Payer: Self-pay | Admitting: *Deleted

## 2016-12-27 NOTE — Telephone Encounter (Signed)
Collected 12/19/16:  JCV 0.14 negative

## 2016-12-29 ENCOUNTER — Encounter (INDEPENDENT_AMBULATORY_CARE_PROVIDER_SITE_OTHER): Payer: Self-pay | Admitting: Neurology

## 2016-12-29 DIAGNOSIS — G35 Multiple sclerosis: Secondary | ICD-10-CM

## 2016-12-29 DIAGNOSIS — Z0289 Encounter for other administrative examinations: Secondary | ICD-10-CM

## 2016-12-29 NOTE — Progress Notes (Signed)
See separate documentation.

## 2016-12-29 NOTE — Progress Notes (Signed)
Sarah [name] is here today for a study visit for the [id number] driug study

## 2017-01-04 ENCOUNTER — Other Ambulatory Visit: Payer: Self-pay | Admitting: Neurology

## 2017-01-04 DIAGNOSIS — G35 Multiple sclerosis: Secondary | ICD-10-CM

## 2017-01-05 ENCOUNTER — Encounter: Payer: Self-pay | Admitting: Neurology

## 2017-01-15 ENCOUNTER — Other Ambulatory Visit: Payer: No Typology Code available for payment source

## 2017-01-16 ENCOUNTER — Other Ambulatory Visit: Payer: No Typology Code available for payment source

## 2017-01-24 ENCOUNTER — Ambulatory Visit
Admission: RE | Admit: 2017-01-24 | Discharge: 2017-01-24 | Disposition: A | Discharge status: Home / Self Care | Payer: No Typology Code available for payment source | Source: Ambulatory Visit | Attending: Neurology | Admitting: Neurology

## 2017-01-24 DIAGNOSIS — G35 Multiple sclerosis: Secondary | ICD-10-CM

## 2017-01-24 MED ORDER — GADOBENATE DIMEGLUMINE 529 MG/ML IV SOLN
18.0000 mL | Freq: Once | INTRAVENOUS | Status: AC | PRN
  Administered 2017-01-24: 18 mL via INTRAVENOUS

## 2017-01-25 ENCOUNTER — Encounter (INDEPENDENT_AMBULATORY_CARE_PROVIDER_SITE_OTHER): Payer: Self-pay | Admitting: Neurology

## 2017-01-25 DIAGNOSIS — G35 Multiple sclerosis: Secondary | ICD-10-CM

## 2017-01-25 DIAGNOSIS — Z0289 Encounter for other administrative examinations: Secondary | ICD-10-CM

## 2017-01-25 NOTE — Progress Notes (Signed)
Patient came in for research evaluation, Please see separate documentation  She has received oclizemub on June 13, tolerating it well, will have 2nd infusion on June 27.

## 2017-02-01 ENCOUNTER — Encounter: Payer: Self-pay | Admitting: Neurology

## 2017-02-02 ENCOUNTER — Encounter: Payer: Self-pay | Admitting: Neurology

## 2017-04-19 ENCOUNTER — Other Ambulatory Visit: Payer: Self-pay | Admitting: Neurology

## 2017-04-19 DIAGNOSIS — G35 Multiple sclerosis: Secondary | ICD-10-CM

## 2017-04-27 ENCOUNTER — Encounter (INDEPENDENT_AMBULATORY_CARE_PROVIDER_SITE_OTHER): Payer: Self-pay | Admitting: Neurology

## 2017-04-27 ENCOUNTER — Ambulatory Visit (INDEPENDENT_AMBULATORY_CARE_PROVIDER_SITE_OTHER): Payer: Self-pay | Admitting: Neurology

## 2017-04-27 DIAGNOSIS — Z0289 Encounter for other administrative examinations: Secondary | ICD-10-CM

## 2017-04-28 ENCOUNTER — Encounter: Payer: Self-pay | Admitting: Neurology

## 2017-05-02 ENCOUNTER — Ambulatory Visit
Admission: RE | Admit: 2017-05-02 | Discharge: 2017-05-02 | Disposition: A | Discharge status: Home / Self Care | Payer: No Typology Code available for payment source | Source: Ambulatory Visit | Attending: Neurology | Admitting: Neurology

## 2017-05-02 DIAGNOSIS — G35 Multiple sclerosis: Secondary | ICD-10-CM

## 2017-05-02 MED ORDER — GADOBENATE DIMEGLUMINE 529 MG/ML IV SOLN
18.0000 mL | Freq: Once | INTRAVENOUS | Status: AC | PRN
  Administered 2017-05-02: 18 mL via INTRAVENOUS

## 2017-06-22 ENCOUNTER — Encounter: Payer: Self-pay | Admitting: *Deleted

## 2017-06-22 ENCOUNTER — Other Ambulatory Visit: Payer: Self-pay | Admitting: *Deleted

## 2017-07-25 ENCOUNTER — Encounter: Payer: Self-pay | Admitting: Neurology

## 2017-07-26 ENCOUNTER — Encounter: Payer: Self-pay | Admitting: Neurology

## 2018-01-30 DIAGNOSIS — G35 Multiple sclerosis: Secondary | ICD-10-CM | POA: Diagnosis not present

## 2018-07-24 DIAGNOSIS — M25562 Pain in left knee: Secondary | ICD-10-CM | POA: Diagnosis not present

## 2018-07-25 ENCOUNTER — Telehealth: Payer: Self-pay | Admitting: Neurology

## 2018-07-25 DIAGNOSIS — M25562 Pain in left knee: Secondary | ICD-10-CM | POA: Diagnosis not present

## 2018-07-25 DIAGNOSIS — M1712 Unilateral primary osteoarthritis, left knee: Secondary | ICD-10-CM | POA: Diagnosis not present

## 2018-07-25 NOTE — Telephone Encounter (Signed)
Pt has called asking to be connected to the infusion suite.  Pt states she has left messages but has yet to hear from them about scheduling her infusion.  Pt aware message is being documented.  Pt has also requested a call from [place] about being scheduled since she is unable to use the portal.

## 2018-07-25 NOTE — Telephone Encounter (Signed)
Spoke to patient - her last Ocrevus infusion was on 01/30/18.  Her next infusion is due.  She would like to schedule her appt with [facility].  The message will be provided to [facility] in [place] as urgent.  I asked the patient to please call me back if she does not hear back about her appt.

## 2018-07-26 DIAGNOSIS — M25562 Pain in left knee: Secondary | ICD-10-CM | POA: Diagnosis not present

## 2018-08-03 DIAGNOSIS — G35 Multiple sclerosis: Secondary | ICD-10-CM | POA: Diagnosis not present

## 2018-10-11 ENCOUNTER — Encounter: Payer: Self-pay | Admitting: Internal Medicine

## 2018-10-11 ENCOUNTER — Other Ambulatory Visit: Payer: Self-pay

## 2018-10-11 ENCOUNTER — Ambulatory Visit (INDEPENDENT_AMBULATORY_CARE_PROVIDER_SITE_OTHER): Payer: BLUE CROSS/BLUE SHIELD | Admitting: Internal Medicine

## 2018-10-11 VITALS — BP 120/74 | HR 78 | Temp 97.9°F | Ht 64.6 in | Wt 196.8 lb

## 2018-10-11 DIAGNOSIS — Z85820 Personal history of malignant melanoma of skin: Secondary | ICD-10-CM

## 2018-10-11 DIAGNOSIS — N644 Mastodynia: Secondary | ICD-10-CM | POA: Diagnosis not present

## 2018-10-11 DIAGNOSIS — F3289 Other specified depressive episodes: Secondary | ICD-10-CM

## 2018-10-11 DIAGNOSIS — N6321 Unspecified lump in the left breast, upper outer quadrant: Secondary | ICD-10-CM

## 2018-10-11 DIAGNOSIS — R635 Abnormal weight gain: Secondary | ICD-10-CM

## 2018-10-11 DIAGNOSIS — D508 Other iron deficiency anemias: Secondary | ICD-10-CM

## 2018-10-11 NOTE — Progress Notes (Signed)
Subjective:     Patient ID: Sarah [name] , female    DOB: 12/06/1980 , 38 y.o.   MRN: [id number]   Chief Complaint  Patient presents with  . New Patient (Initial Visit)    hasnt had pcp since 38 yrs old  .  breast tenderness    3 weeks-bilateral-no lumps-    HPI  Pt is here to establish care and has hx of anemia  Since child hood. Has not taken this in several years.  Has been dealing with bilateral breast tenderness x 3 weeks. Has tried hot compressess and changed braws but has not helped.  LMP- 2/9  Has tubal 12/2015   Past Medical History:  Diagnosis Date  . Headache   . Heart murmur   . Melanoma ([place]) 1996   on head  . Multiple sclerosis ([place])   . Multiple sclerosis ([place])      Family History  Problem Relation Age of Onset  . Diabetes Mother   . Diabetes Maternal Uncle   . Hypertension Maternal Grandmother      Current Outpatient Medications:  .  acetaminophen (TYLENOL) 500 MG tablet, Take 500 mg by mouth every 6 (six) hours as needed., Disp: , Rfl:  .  ferrous sulfate 325 (65 FE) MG tablet, Take 325 mg by mouth daily with breakfast., Disp: , Rfl:  .  ocrelizumab 600 mg in sodium chloride 0.9 % 500 mL, Inject 600 mg every 6 (six) months into the vein., Disp: , Rfl:    No Known Allergies   Review of Systems  Constitutional: Positive for fatigue. Negative for appetite change.  Respiratory: Negative for chest tightness and shortness of breath.   Cardiovascular: Negative for chest pain, palpitations and leg swelling.  Gastrointestinal: Positive for constipation.  Skin: Negative for rash and wound.       + dry skin  Neurological: Positive for headaches.       Has chronic HA's 4/week and tylenol helps. Gets sonophobia and photophobia  Hematological: Does not bruise/bleed easily.  Psychiatric/Behavioral: Positive for sleep disturbance.     Today's Vitals   10/11/18 1108  BP: 120/74  Pulse: 78  Temp: 97.9 F (36.6 C)  TempSrc: Oral  SpO2: 98%  Weight:  196 lb 12.8 oz (89.3 kg)  Height: 5' 4.6" (1.641 m)   Body mass index is 33.16 kg/m.   Objective:  Physical Exam Chest:     Breasts:        Right: Tenderness present. No swelling, inverted nipple, nipple discharge or skin change.        Left: Mass and tenderness present. No swelling, inverted nipple, nipple discharge or skin change.       Comments: Has very thick dense tissue which she does not recall palpating before on L breast at 1 o'clock which is tender. Most of her tenderness is on lower and outer breast bilaterally.  Skin:    General: Skin is warm and dry.     Comments: Has keloid on occipital lower scalp from melanoma removal.     Constitutional: She is oriented to person, place, and time. She appears well-developed and well-nourished. No distress.  HENT:  Head: Normocephalic and atraumatic.  Right Ear: External ear normal.  Left Ear: External ear normal.  Nose: Nose normal.  Eyes: Conjunctivae are normal. Right eye exhibits no discharge. Left eye exhibits no discharge. No scleral icterus.  Neck: Neck supple. No thyromegaly present.  Cardiovascular: Normal rate and regular rhythm.  No murmur heard.  Pulmonary/Chest: Effort normal and breath sounds normal. No respiratory distress.  Musculoskeletal: Normal range of motion. She exhibits no edema.  Lymphadenopathy:    She has no cervical adenopathy.  Neurological: She is alert and oriented to person, place, and time.  Skin: Skin is warm and dry. Capillary refill takes less than 2 seconds. No rash noted. She is not diaphoretic.  Psychiatric: She has a normal mood and affect. Her behavior is normal. Judgment and thought content normal.  Nursing note reviewed.  Assessment And Plan:     1. Other iron deficiency anemia- past history of this.  - CBC no Diff  2. Weight gain- chronic  - TSH - T4, Free - T3, free  3. History of melanoma- past hx of this. Will send her to [facility] for skin check.  - CMP14 + Anion Gap  4.  Other depression- + PHQ9, we will address this during physical next time.  - TSH - T4, Free - T3, free  5. Mastalgia- bilateral.  - MM Digital Diagnostic Bilat; Future  6. Breast lump on left side at 1 o'clock position- new.  - MM Digital Diagnostic Bilat; Future   Alin Hutchins RODRIGUEZ-SOUTHWORTH, PA-C

## 2018-10-12 LAB — CBC
HEMATOCRIT: 34.9 % (ref 34.0–46.6)
Hemoglobin: 11.2 g/dL (ref 11.1–15.9)
MCH: 28.8 pg (ref 26.6–33.0)
MCHC: 32.1 g/dL (ref 31.5–35.7)
MCV: 90 fL (ref 79–97)
Platelets: 353 10*3/uL (ref 150–450)
RBC: 3.89 x10E6/uL (ref 3.77–5.28)
RDW: 14 % (ref 11.7–15.4)
WBC: 6.8 10*3/uL (ref 3.4–10.8)

## 2018-10-12 LAB — TSH: TSH: 1.11 u[IU]/mL (ref 0.450–4.500)

## 2018-10-12 LAB — CMP14 + ANION GAP
ALT: 13 IU/L (ref 0–32)
AST: 10 IU/L (ref 0–40)
Albumin/Globulin Ratio: 1.6 (ref 1.2–2.2)
Albumin: 4.6 g/dL (ref 3.8–4.8)
Alkaline Phosphatase: 87 IU/L (ref 39–117)
Anion Gap: 17 mmol/L (ref 10.0–18.0)
BUN/Creatinine Ratio: 12 (ref 9–23)
BUN: 9 mg/dL (ref 6–20)
Bilirubin Total: 0.2 mg/dL (ref 0.0–1.2)
CO2: 21 mmol/L (ref 20–29)
Calcium: 9.4 mg/dL (ref 8.7–10.2)
Chloride: 103 mmol/L (ref 96–106)
Creatinine, Ser: 0.74 mg/dL (ref 0.57–1.00)
GFR calc non Af Amer: 105 mL/min/{1.73_m2} (ref >59)
GFR, EST AFRICAN AMERICAN: 121 mL/min/{1.73_m2} (ref >59)
Globulin, Total: 2.8 g/dL (ref 1.5–4.5)
Glucose: 76 mg/dL (ref 65–99)
Potassium: 4.5 mmol/L (ref 3.5–5.2)
Sodium: 141 mmol/L (ref 134–144)
TOTAL PROTEIN: 7.4 g/dL (ref 6.0–8.5)

## 2018-10-12 LAB — T3, FREE: T3, Free: 2.8 pg/mL (ref 2.0–4.4)

## 2018-10-12 LAB — T4, FREE: Free T4: 0.94 ng/dL (ref 0.82–1.77)

## 2018-10-19 ENCOUNTER — Other Ambulatory Visit: Payer: Self-pay | Admitting: Internal Medicine

## 2018-10-19 DIAGNOSIS — N644 Mastodynia: Secondary | ICD-10-CM

## 2018-10-19 DIAGNOSIS — N6321 Unspecified lump in the left breast, upper outer quadrant: Secondary | ICD-10-CM

## 2018-10-25 ENCOUNTER — Other Ambulatory Visit: Payer: Self-pay

## 2018-10-25 ENCOUNTER — Other Ambulatory Visit: Payer: Self-pay | Admitting: Internal Medicine

## 2018-10-25 ENCOUNTER — Ambulatory Visit
Admission: RE | Admit: 2018-10-25 | Discharge: 2018-10-25 | Disposition: A | Discharge status: Home / Self Care | Payer: BLUE CROSS/BLUE SHIELD | Source: Ambulatory Visit | Attending: Internal Medicine | Admitting: Internal Medicine

## 2018-10-25 ENCOUNTER — Ambulatory Visit
Admission: RE | Admit: 2018-10-25 | Discharge: 2018-10-25 | Disposition: A | Discharge status: Home / Self Care | Payer: No Typology Code available for payment source | Source: Ambulatory Visit | Attending: Internal Medicine | Admitting: Internal Medicine

## 2018-10-25 DIAGNOSIS — N6320 Unspecified lump in the left breast, unspecified quadrant: Secondary | ICD-10-CM | POA: Diagnosis not present

## 2018-10-25 DIAGNOSIS — N6310 Unspecified lump in the right breast, unspecified quadrant: Secondary | ICD-10-CM

## 2018-10-25 DIAGNOSIS — N6321 Unspecified lump in the left breast, upper outer quadrant: Secondary | ICD-10-CM

## 2018-10-25 DIAGNOSIS — R922 Inconclusive mammogram: Secondary | ICD-10-CM | POA: Diagnosis not present

## 2018-10-25 DIAGNOSIS — N644 Mastodynia: Secondary | ICD-10-CM

## 2018-10-26 ENCOUNTER — Ambulatory Visit (INDEPENDENT_AMBULATORY_CARE_PROVIDER_SITE_OTHER): Payer: BLUE CROSS/BLUE SHIELD | Admitting: Internal Medicine

## 2018-10-26 ENCOUNTER — Other Ambulatory Visit (HOSPITAL_COMMUNITY)
Admission: RE | Admit: 2018-10-26 | Discharge: 2018-10-26 | Disposition: A | Discharge status: Home / Self Care | Payer: BLUE CROSS/BLUE SHIELD | Source: Ambulatory Visit | Attending: Internal Medicine | Admitting: Internal Medicine

## 2018-10-26 ENCOUNTER — Encounter: Payer: Self-pay | Admitting: Internal Medicine

## 2018-10-26 VITALS — BP 118/70 | HR 78 | Temp 98.5°F | Ht 64.6 in | Wt 200.6 lb

## 2018-10-26 DIAGNOSIS — R7309 Other abnormal glucose: Secondary | ICD-10-CM

## 2018-10-26 DIAGNOSIS — Z0000 Encounter for general adult medical examination without abnormal findings: Secondary | ICD-10-CM

## 2018-10-26 DIAGNOSIS — D649 Anemia, unspecified: Secondary | ICD-10-CM

## 2018-10-26 DIAGNOSIS — Z124 Encounter for screening for malignant neoplasm of cervix: Secondary | ICD-10-CM | POA: Insufficient documentation

## 2018-10-26 DIAGNOSIS — Z113 Encounter for screening for infections with a predominantly sexual mode of transmission: Secondary | ICD-10-CM | POA: Diagnosis not present

## 2018-10-26 DIAGNOSIS — G35 Multiple sclerosis: Secondary | ICD-10-CM | POA: Diagnosis not present

## 2018-10-26 NOTE — Progress Notes (Signed)
Subjective:     Patient ID: Sarah [name] , female    DOB: 09/27/1980 , 38 y.o.   MRN: [id number]   Chief Complaint  Patient presents with  . Annual Exam    HPI Pt is here for physical with pap.  LMP 3/5, had tubes tied.   Past Medical History:  Diagnosis Date  . Headache   . Heart murmur   . Melanoma ([place]) 1996   on head  . Multiple sclerosis ([place])   . Multiple sclerosis ([place])      Family History  Problem Relation Age of Onset  . Diabetes Mother   . Diabetes Maternal Uncle   . Hypertension Maternal Grandmother      Current Outpatient Medications:  .  acetaminophen (TYLENOL) 500 MG tablet, Take 500 mg by mouth every 6 (six) hours as needed., Disp: , Rfl:  .  ferrous sulfate 325 (65 FE) MG tablet, Take 325 mg by mouth daily with breakfast., Disp: , Rfl:  .  ocrelizumab 600 mg in sodium chloride 0.9 % 500 mL, Inject 600 mg every 6 (six) months into the vein., Disp: , Rfl:    No Known Allergies   Review of Systems  Constitutional: Negative for chills, diaphoresis and fever.  HENT: Negative.   Eyes: Negative for visual disturbance.       Has not seen her eye MD in 4 years  Respiratory: Negative for cough and shortness of breath.   Cardiovascular: Negative for chest pain.  Gastrointestinal: Negative for abdominal distention, abdominal pain, blood in stool, constipation, diarrhea, nausea and vomiting.  Genitourinary: Negative for dysuria, vaginal bleeding and vaginal discharge.       Has incontinence from her MS  Musculoskeletal: Positive for gait problem.       From MS  Skin: Negative for rash and wound.       Has dry skin patches on face and other areas which is chronic for her  Neurological: Positive for numbness.       Has numbness all over and worse on hands and feet from MS. Has HA's off and on and tylenol or Aleeve helps. Gets then 2-3 / week   Hematological: Negative for adenopathy. Does not bruise/bleed easily.  Psychiatric/Behavioral:       Denies  depression, gets stressed from just being a mother of 5 kids.      Today's Vitals   10/26/18 0900  BP: 118/70  Pulse: 78  Temp: 98.5 F (36.9 C)  TempSrc: Oral  SpO2: 98%  Weight: 200 lb 9.6 oz (91 kg)  Height: 5' 4.6" (1.641 m)   Body mass index is 33.8 kg/m.   Objective:  Physical Exam  BP 118/70 (BP Location: Left Arm, Patient Position: Sitting, Cuff Size: Normal)   Pulse 78   Temp 98.5 F (36.9 C) (Oral)   Ht 5' 4.6" (1.641 m)   Wt 200 lb 9.6 oz (91 kg)   LMP 10/12/2018   SpO2 98%   BMI 33.80 kg/m   General Appearance:    Alert, cooperative, no distress, appears stated age  Head:    Normocephalic, without obvious abnormality, atraumatic  Eyes:    PERRL, conjunctiva/corneas clear, EOM's intact, fundi    benign, both eyes  Ears:    Normal TM's and external ear canals, both ears  Nose:   Nares normal, septum midline, mucosa normal, no drainage    or sinus tenderness  Throat:   Lips, mucosa, and tongue normal; teeth and gums normal  Neck:   Supple, symmetrical, trachea midline, no adenopathy;    thyroid:  no enlargement/tenderness/nodules; no carotid   bruit or JVD  Back:     Symmetric, no curvature, ROM normal, no CVA tenderness  Lungs:     Clear to auscultation bilaterally, respirations unlabored  Chest Wall:    No tenderness or deformity   Heart:    Regular rate and rhythm, S1 and S2 normal, no murmur, rub   or gallop  Breast Exam:    No tenderness, masses, or nipple abnormality  Abdomen:     Soft, non-tender, bowel sounds active all four quadrants,    no masses, no organomegaly  Genitalia:    Normal female without lesion, discharge or tenderness     Extremities:   Extremities normal, atraumatic, no cyanosis or edema  Pulses:   2+ and symmetric all extremities  Skin:   Skin color, texture, turgor normal, no rashes or lesions  Lymph nodes:   Cervical, supraclavicular, and axillary nodes normal  Neurologic:   R upper and lower extremity strength 4/5, R side  5/5( this is her normal due to MS) speech is intact. +2/4 brachial and patellar reflexes bilaterally, unable to get R achillis reflex and L achillis +1/4.       Assessment And Plan:   1. Routine medical exam- routine - Lipid Profile  2. Abnormal glucose- acute - Hemoglobin A1c  3. Anemia, unspecified type- chronic.  - Iron and TIBC([facility]) - Ferritin  4- MS- chronic. NO action, will continue care with PCP 5- Screen STD- test sent out.  We will inform her when the results are back.  FU 6 months.    Rashan Rounsaville RODRIGUEZ-SOUTHWORTH, PA-C

## 2018-10-26 NOTE — Patient Instructions (Signed)
READ ABOUT ESTROGEN DYSRUPTORS [email or web address]   Preventive Care 18-39 Years, Female Preventive care refers to lifestyle choices and visits with your health care provider that can promote health and wellness. What does preventive care include?   A yearly physical exam. This is also called an annual well check.  Dental exams once or twice a year.  Routine eye exams. Ask your health care provider how often you should have your eyes checked.  Personal lifestyle choices, including: ? Daily care of your teeth and gums. ? Regular physical activity. ? Eating a healthy diet. ? Avoiding tobacco and drug use. ? Limiting alcohol use. ? Practicing safe sex. ? Taking vitamin and mineral supplements as recommended by your health care provider. What happens during an annual well check? The services and screenings done by your health care provider during your annual well check will depend on your age, overall health, lifestyle risk factors, and family history of disease. Counseling Your health care provider may ask you questions about your:  Alcohol use.  Tobacco use.  Drug use.  Emotional well-being.  Home and relationship well-being.  Sexual activity.  Eating habits.  Work and work Statistician.  Method of birth control.  Menstrual cycle.  Pregnancy history. Screening You may have the following tests or measurements:  Height, weight, and BMI.  Diabetes screening. This is done by checking your blood sugar (glucose) after you have not eaten for a while (fasting).  Blood pressure.  Lipid and cholesterol levels. These may be checked every 5 years starting at age 24.  Skin check.  Hepatitis C blood test.  Hepatitis B blood test.  Sexually transmitted disease (STD) testing.  BRCA-related cancer screening. This may be done if you have a family history of breast, ovarian, tubal, or peritoneal cancers.  Pelvic exam and Pap test. This may be done every 3 years starting at  age 100. Starting at age 2, this may be done every 5 years if you have a Pap test in combination with an HPV test. Discuss your test results, treatment options, and if necessary, the need for more tests with your health care provider. Vaccines Your health care provider may recommend certain vaccines, such as:  Influenza vaccine. This is recommended every year.  Tetanus, diphtheria, and acellular pertussis (Tdap, Td) vaccine. You may need a Td booster every 10 years.  Varicella vaccine. You may need this if you have not been vaccinated.  HPV vaccine. If you are 46 or younger, you may need three doses over 6 months.  Measles, mumps, and rubella (MMR) vaccine. You may need at least one dose of MMR. You may also need a second dose.  Pneumococcal 13-valent conjugate (PCV13) vaccine. You may need this if you have certain conditions and were not previously vaccinated.  Pneumococcal polysaccharide (PPSV23) vaccine. You may need one or two doses if you smoke cigarettes or if you have certain conditions.  Meningococcal vaccine. One dose is recommended if you are age 9-21 years and a first-year college student living in a residence hall, or if you have one of several medical conditions. You may also need additional booster doses.  Hepatitis A vaccine. You may need this if you have certain conditions or if you travel or work in places where you may be exposed to hepatitis A.  Hepatitis B vaccine. You may need this if you have certain conditions or if you travel or work in places where you may be exposed to hepatitis B.  Haemophilus influenzae type b (Hib)  vaccine. You may need this if you have certain risk factors. Talk to your health care provider about which screenings and vaccines you need and how often you need them. This information is not intended to replace advice given to you by your health care provider. Make sure you discuss any questions you have with your health care provider. Document  Released: 09/21/2001 Document Revised: 03/08/2017 Document Reviewed: 05/27/2015 Elsevier Interactive Patient Education  2019 [employer].

## 2018-10-31 ENCOUNTER — Other Ambulatory Visit: Payer: Self-pay | Admitting: Internal Medicine

## 2018-10-31 LAB — LIPID PANEL
Chol/HDL Ratio: 3.3 ratio (ref 0.0–4.4)
Cholesterol, Total: 167 mg/dL (ref 100–199)
HDL: 50 mg/dL (ref >39)
LDL Calculated: 96 mg/dL (ref 0–99)
Triglycerides: 106 mg/dL (ref 0–149)
VLDL Cholesterol Cal: 21 mg/dL (ref 5–40)

## 2018-10-31 LAB — CERVICOVAGINAL ANCILLARY ONLY
Bacterial vaginitis: POSITIVE — AB
Candida vaginitis: NEGATIVE
Chlamydia: NEGATIVE
Neisseria Gonorrhea: NEGATIVE
Trichomonas: NEGATIVE

## 2018-10-31 LAB — CYTOLOGY - PAP
Adequacy: ABSENT
Chlamydia: NEGATIVE
Diagnosis: NEGATIVE
Neisseria Gonorrhea: NEGATIVE
Trichomonas: NEGATIVE

## 2018-10-31 LAB — HEMOGLOBIN A1C
ESTIMATED AVERAGE GLUCOSE: 111 mg/dL
Hgb A1c MFr Bld: 5.5 % (ref 4.8–5.6)

## 2018-10-31 LAB — IRON AND TIBC
Iron Saturation: 13 % — ABNORMAL LOW (ref 15–55)
Iron: 46 ug/dL (ref 27–159)
Total Iron Binding Capacity: 357 ug/dL (ref 250–450)
UIBC: 311 ug/dL (ref 131–425)

## 2018-10-31 LAB — FERRITIN: Ferritin: 29 ng/mL (ref 15–150)

## 2018-10-31 MED ORDER — METRONIDAZOLE 0.75 % VA GEL: VAGINAL | 0 refills | Status: DC

## 2018-12-18 ENCOUNTER — Telehealth: Payer: Self-pay | Admitting: Neurology

## 2018-12-18 NOTE — Telephone Encounter (Signed)
Pt has called for the infusion suite, call connected.

## 2019-01-04 ENCOUNTER — Other Ambulatory Visit: Payer: Self-pay

## 2019-01-04 ENCOUNTER — Ambulatory Visit (INDEPENDENT_AMBULATORY_CARE_PROVIDER_SITE_OTHER): Payer: BLUE CROSS/BLUE SHIELD | Admitting: Neurology

## 2019-01-04 DIAGNOSIS — R269 Unspecified abnormalities of gait and mobility: Secondary | ICD-10-CM

## 2019-01-04 DIAGNOSIS — G35 Multiple sclerosis: Secondary | ICD-10-CM | POA: Diagnosis not present

## 2019-01-04 NOTE — Progress Notes (Signed)
PATIENT: Sarah [name] DOB: July 15, 1981  No chief complaint on file.    HISTORICAL  CHARIE PINKUS, seen in request by    She had a history of relapsing remitting multiple sclerosis, the diagnosis was made in 2002, she presented with right optic neuritis, initially was treated at [facility] with steroid, later Betaseron, she did very well but she had a stillborn at week 42 while using Betaseron before and during pregnancy, she later had more pregnant first born in December 2004, second born August 2006, third child in April 2009 with twins, Betaseron was on hold during her pregnancy.  She had a major flareup in summer of 2007, she had 4 extremity paresthesia, weakness, difficulty walking, she was again put on steroid and Betaseron transiently, she could not tolerate the injection due to flulike illness, nausea or vomiting, and the cost factor. Betaseron was stopped,  Third major flareup was in January 2011, she had significant weakness, fall and paresthesia, difficulty walking driving for 2 months, D[place]ane Dee was discussed with her at that time, but because worried the side effect of PML, it was not started,  She was not on any treatment between 2011 and 2014.  Another flareup was in February 2014, she had right eye pain, blurry vision, right arm and leg weakness, increased gait abnormality, constipation, bladder incontinence, somewhat responsive to steroid,  MRI of the brain in 2015 showed multiple bilateral periventricular and subcortical MS lesions, MRI of cervical spine also demonstrate increased T2 at cervical cord at C2-3, C5, left-sided lesion at C7. MRI of the thoracic spine showed diffuse cord abnormality at T1-T9, focal enhancement at ventral surface of the cord at T5-6, extending cephalocaudal fourth 12 mm,  JC virus antibody was negative with titer of 0.19 in May 2015, she was enrolled in research study was treated with Copaxone in April 2014, later she was rolled over to  [place] since early 2014,   Her gait difficulty has much improved with Gilenya treatment,, but still with baseline bilateral lower extremity spasticity and paresthesia, last office visit was in May 2015, she has developed lower back cellulitis in April 2015, she was treated with antibiotics, she has stopped Gilenya for about 2 weeks, then had another flareup of aggressive worsening gait abnormality, right arm and leg weakness, paresthesia, also progressive worsening bilateral visual difficulty, severely restricted visual field, decreased visual acuity. During last office visit in May 2015, She was found to have severely restricted bilateral visual field, decreased visual acuity, counting fingers with right eye, left eye could only read large print, mild spastic quadriplegia, right worse than left, with motor strength of 4/5, ambulate with a wide based unsteady gait  She was sent to hospital admission in May 2015, her symptoms has much improved with high-dose of IV steroid followed by physical therapy, she has lost follow-ups since. 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 June 2015    She was pregnant since September 2016,  had elective C-section on Dec 20 2015, with healthy girl  She presented to emergency room on March 24 2016 for  worsening bilateral hands feet numbness, unsteady gait, weakness, she started to have worsening paresthesia, bladder urgency, incontinence, increased right eye blurry postpartum 6 week around June 2017, progressively worsened  I have personally reviewed repeat MRI of the brain, cervical spine with and without contrast in  August 2017, in comparison with 2015, new enhancing nations in periventricular and subcortical white matter, splenium of corpus callosum consistent with active demyelinating, new  enhancing cord lesion at C4 in the dorsal.  She was enrolled into MS search since Oct 2017, we have reviewed most recent MRI, in comparison to MRI of the brain with and without contrast in August 2017, and 2015, there was increase supratentorium lesion load, there was also 5 foci enhanced, which was not present in MRI of the brain in August 2017.  MRI of the cervical spine in August 2017 showed new enhancing cord lesion at C4,  She started research medication subcutaneous (CD20 monoclonal antibody) every month plus pill (Abulgia).   She reported significant change at the end of April 2018, she developed double vision, dizziness, ataxic gait, she began IV Solu-Medrol infusion 1000 mg daily since Dec 14 2016, complete 3 days today, about 2 weeks after symptoms onset without significant improvement,  She denies signs of infection, reviewed laboratory evaluation showed hemoglobin 10 point 5, creatinine 0.71, cholesterol 156, triglycerides 193, UA on November 15 2016 was negative  Virtual Visit via Video  I connected with Kathrin Ruddy Teegarden on 01/05/19 at  by Video and verified that I am speaking with the correct person using two identifiers.   I discussed the limitations, risks, security and privacy concerns of performing an evaluation and management service by video and the availability of in person appointments. I also discussed with the patient that there may be a patient responsible charge related to this service. The patient expressed understanding and agreed to proceed.   History of Present Illness: She was switched to ocrelizumab since May 2018, tolerating the medication well, there was no longer flareup since the treatment, most recent infusion was on July 14, 2018, she works full-time, mildly unsteady gait    Observations/Objective: I have reviewed problem lists, medications, allergies.  Awake alert oriented to history taking and casual conversation, no dysarthria, moving 4 extremities  without difficulties, mildly unsteady gait  Assessment and Plan: Relapsing remitting multiple sclerosis  Started Ocrevus infusion since May 2018, doing very well,  Repeat MRI of the brain with without contrast  Follow Up Instructions:  6 months    I discussed the assessment and treatment plan with the patient. The patient was provided an opportunity to ask questions and all were answered. The patient agreed with the plan and demonstrated an understanding of the instructions.   The patient was advised to call back or seek an in-person evaluation if the symptoms worsen or if the condition fails to improve as anticipated.  I provided 30 minutes of non-face-to-face time during this encounter.   Marcial Pacas, MD

## 2019-01-05 ENCOUNTER — Encounter: Payer: Self-pay | Admitting: Neurology

## 2019-01-09 ENCOUNTER — Telehealth: Payer: Self-pay | Admitting: Neurology

## 2019-01-09 NOTE — Telephone Encounter (Signed)
no to the covid-19 questions MR Brain w/wo contrast Dr. Krista Blue BCBS Auth: [place] via [facility] website. Patient is scheduled at [facility] for 01/17/19.

## 2019-01-10 NOTE — Telephone Encounter (Signed)
Patient called me and informed me that she works at [facility] and it is cheaper for her to have it there. I faxed the order fax # [phone] phone # [phone].

## 2019-01-17 ENCOUNTER — Other Ambulatory Visit: Payer: BLUE CROSS/BLUE SHIELD

## 2019-01-17 DIAGNOSIS — G35 Multiple sclerosis: Secondary | ICD-10-CM | POA: Diagnosis not present

## 2019-01-31 ENCOUNTER — Telehealth: Payer: Self-pay | Admitting: Neurology

## 2019-01-31 DIAGNOSIS — G35 Multiple sclerosis: Secondary | ICD-10-CM | POA: Diagnosis not present

## 2019-01-31 NOTE — Telephone Encounter (Signed)
I was able to compare her MRI of the brain with and without contrast from [facility] dated January 17, 2019 with her previous MRI in 2018.  Moderate burden white matter lesion most compatible with her diagnosis of multiple sclerosis, predominantly in periventricular distribution, focal cortical/just cortical lesion involving the right superior precentral gyri, no contrast-enhancement  There was no significant change compared to previous scan in 2018.

## 2019-05-03 ENCOUNTER — Encounter: Payer: Self-pay | Admitting: Internal Medicine

## 2019-05-03 ENCOUNTER — Ambulatory Visit (INDEPENDENT_AMBULATORY_CARE_PROVIDER_SITE_OTHER): Payer: BC Managed Care – PPO | Admitting: Internal Medicine

## 2019-05-03 ENCOUNTER — Other Ambulatory Visit: Payer: Self-pay

## 2019-05-03 VITALS — BP 118/72 | HR 74 | Temp 98.1°F | Ht 64.6 in | Wt 217.0 lb

## 2019-05-03 DIAGNOSIS — D508 Other iron deficiency anemias: Secondary | ICD-10-CM | POA: Diagnosis not present

## 2019-05-03 DIAGNOSIS — F329 Major depressive disorder, single episode, unspecified: Secondary | ICD-10-CM | POA: Diagnosis not present

## 2019-05-03 DIAGNOSIS — Z9114 Patient's other noncompliance with medication regimen: Secondary | ICD-10-CM | POA: Diagnosis not present

## 2019-05-03 DIAGNOSIS — F32A Depression, unspecified: Secondary | ICD-10-CM

## 2019-05-03 MED ORDER — SERTRALINE HCL 25 MG PO TABS: ORAL_TABLET | ORAL | 0 refills | Status: DC

## 2019-05-03 NOTE — Progress Notes (Signed)
Subjective:     Patient ID: Sarah [name] , female    DOB: 09-01-1980 , 38 y.o.   MRN: [id number]   Chief Complaint  Patient presents with  . Follow-up    HPI  Fu anemia, and has not been taking her iron in 8-9 months. She never went to buy any more. Has been fatigued and stressed.  She denies being able to sleep well for years, but since May it is getting worse. She is only getting 2-3 hours. She has episodes of crying and has had depression as teen and was placed on Prozac then. She denies suicidal thoughts, but feels she wishes she was not around so she would not be so stressed. She knows her kids need her and if something happens to her, they would end up with their father which she says would not be good for them.   Past Medical History:  Diagnosis Date  . Headache   . Heart murmur   . Melanoma ([place]) 1996   on head  . Multiple sclerosis ([place])   . Multiple sclerosis ([place])      Family History  Problem Relation Age of Onset  . Diabetes Mother   . Diabetes Maternal Uncle   . Hypertension Maternal Grandmother      Current Outpatient Medications:  .  acetaminophen (TYLENOL) 500 MG tablet, Take 500 mg by mouth every 6 (six) hours as needed., Disp: , Rfl:  .  ferrous sulfate 325 (65 FE) MG tablet, Take 325 mg by mouth daily with breakfast., Disp: , Rfl:  .  ocrelizumab 600 mg in sodium chloride 0.9 % 500 mL, Inject 600 mg every 6 (six) months into the vein., Disp: , Rfl:    No Known Allergies   Review of Systems  Denies CP, SOB, abdominal pain, edema, urinary difficulty. Positive for insomnia, constipation, craving sweets, increased stress, feeling overwhelmed, crying spells, heavy menses.   Today's Vitals   05/03/19 0850  BP: 118/72  Pulse: 74  Temp: 98.1 F (36.7 C)  TempSrc: Oral  Weight: 217 lb (98.4 kg)  Height: 5' 4.6" (1.641 m)   Body mass index is 36.56 kg/m.   Objective:  Physical Exam   Constitutional: She is oriented to person, place, and time. She  appears well-developed and well-nourished. No distress.  HENT:  Head: Normocephalic and atraumatic.  Right Ear: External ear normal.  Left Ear: External ear normal.  Nose: Nose normal.  Eyes: Conjunctivae are normal. Right eye exhibits no discharge. Left eye exhibits no discharge. No scleral icterus.  Neck: Neck supple. No thyromegaly present.  No carotid bruits bilaterally  Cardiovascular: Normal rate and regular rhythm.  No murmur heard. Pulmonary/Chest: Effort normal and breath sounds normal. No respiratory distress.  Musculoskeletal: Normal range of motion. She exhibits no edema.  Lymphadenopathy:    She has no cervical adenopathy.  Neurological: She is alert and oriented to person, place, and time.  Skin: Skin is warm and dry. Capillary refill takes less than 2 seconds. No rash noted. She is not diaphoretic.  Psychiatric: Sad affect, tearful. Judgment and thought content normal.  Nursing note reviewed.  Assessment And Plan:   1. Other iron deficiency anemia- chronic secondary to heavy menses  - COMPLETE METABOLIC PANEL WITH GFR - CBC no Diff - Iron  2. Depression, unspecified depression type- acute.     I placed her on Zoloft 25 mg qhs x 3 night and may increase to 50 mg qd.     FU  in 1 week. If she is not sleeping well, I will add Trazodone and move the Zoloft dose to am.     Zak Gondek RODRIGUEZ-SOUTHWORTH, PA-C    THE PATIENT IS ENCOURAGED TO PRACTICE SOCIAL DISTANCING DUE TO THE COVID-19 PANDEMIC.

## 2019-05-03 NOTE — Patient Instructions (Signed)
Get a natural Magnesium Powder called CALM and take it every night to help you with sleep.    Stress Stress is a normal reaction to life events. Stress is what you feel when life demands more than you are used to, or more than you think you can handle. Some stress can be useful, such as studying for a test or meeting a deadline at work. Stress that occurs too often or for too long can cause problems. It can affect your emotional health and interfere with relationships and normal daily activities. Too much stress can weaken your body's defense system (immune system) and increase your risk for physical illness. If you already have a medical problem, stress can make it worse. What are the causes? All sorts of life events can cause stress. An event that causes stress for one person may not be stressful for another person. Major life events, whether positive or negative, commonly cause stress. Examples include:  Losing a job or starting a new job.  Losing a loved one.  Moving to a new town or home.  Getting married or divorced.  Having a baby.  Injury or illness. Less obvious life events can also cause stress, especially if they occur day after day or in combination with each other. Examples include:  Working long hours.  Driving in traffic.  Caring for children.  Being in debt.  Being in a difficult relationship. What are the signs or symptoms? Stress can cause emotional symptoms, including:  Anxiety. This is feeling worried, afraid, on edge, overwhelmed, or out of control.  Anger, including irritation or impatience.  Depression. This is feeling sad, down, helpless, or guilty.  Trouble focusing, remembering, or making decisions. Stress can cause physical symptoms, including:  Aches and pains. These may affect your head, neck, back, stomach, or other areas of your body.  Tight muscles or a clenched jaw.  Low energy.  Trouble sleeping. Stress can cause unhealthy behaviors,  including:  Eating to feel better (overeating) or skipping meals.  Working too much or putting off tasks.  Smoking, drinking alcohol, or using drugs to feel better. How is this diagnosed? Stress is diagnosed through an assessment by your health care provider. He or she may diagnose this condition based on:  Your symptoms and any stressful life events.  Your medical history.  Tests to rule out other causes of your symptoms. Depending on your condition, your health care provider may refer you to a specialist for further evaluation. How is this treated?  Stress management techniques are the recommended treatment for stress. Medicine is not typically recommended for the treatment of stress. Techniques to reduce your reaction to stressful life events include:  Stress identification. Monitor yourself for symptoms of stress and identify what causes stress for you. These skills may help you to avoid or prepare for stressful events.  Time management. Set your priorities, keep a calendar of events, and learn to say "no." Taking these actions can help you avoid making too many commitments. Techniques for coping with stress include:  Rethinking the problem. Try to think realistically about stressful events rather than ignoring them or overreacting. Try to find the positives in a stressful situation rather than focusing on the negatives.  Exercise. Physical exercise can release both physical and emotional tension. The key is to find a form of exercise that you enjoy and do it regularly.  Relaxation techniques. These relax the body and mind. The key is to find one or more that you enjoy  and use the technique(s) regularly. Examples include: ? Meditation, deep breathing, or progressive relaxation techniques. ? Yoga or tai chi. ? Biofeedback, mindfulness techniques, or journaling. ? Listening to music, being out in nature, or participating in other hobbies.  Practicing a healthy lifestyle. Eat a  balanced diet, drink plenty of water, limit or avoid caffeine, and get plenty of sleep.  Having a strong support network. Spend time with family, friends, or other people you enjoy being around. Express your feelings and talk things over with someone you trust. Counseling or talk therapy with a mental health professional may be helpful if you are having trouble managing stress on your own. Follow these instructions at home: Lifestyle   Avoid drugs.  Do not use any products that contain nicotine or tobacco, such as cigarettes and e-cigarettes. If you need help quitting, ask your health care provider.  Limit alcohol intake to no more than 1 drink a day for nonpregnant women and 2 drinks a day for men. One drink equals 12 oz of beer, 5 oz of wine, or 1 oz of hard liquor.  Do not use alcohol or drugs to relax.  Eat a balanced diet that includes fresh fruits and vegetables, whole grains, lean meats, fish, eggs, and beans, and low-fat dairy. Avoid processed foods and foods high in added fat, sugar, and salt.  Exercise at least 30 minutes on 5 or more days each week.  Get 7-8 hours of sleep each night. General instructions   Practice stress management techniques as discussed with your health care provider.  Drink enough fluid to keep your urine clear or pale yellow.  Take over-the-counter and prescription medicines only as told by your health care provider.  Keep all follow-up visits as told by your health care provider. This is important. Contact a health care provider if:  Your symptoms get worse.  You have new symptoms.  You feel overwhelmed by your problems and can no longer manage them on your own. Get help right away if:  You have thoughts of hurting yourself or others. If you ever feel like you may hurt yourself or others, or have thoughts about taking your own life, get help right away. You can go to your nearest emergency department or call:  Your local emergency  services (911 in the [place]  A suicide crisis helpline, such as the [facility] at 5182951286. This is open 24 hours a day. Summary  Stress is a normal reaction to life events. It can cause problems if it happens too often or for too long.  Practicing stress management techniques is the best way to treat stress.  Counseling or talk therapy with a mental health professional may be helpful if you are having trouble managing stress on your own. This information is not intended to replace advice given to you by your health care provider. Make sure you discuss any questions you have with your health care provider. Document Released: 01/19/2001 Document Revised: 07/08/2017 Document Reviewed: 09/15/2016 Elsevier Patient Education  2020 [employer].

## 2019-05-04 LAB — CBC
Hematocrit: 32.5 % — ABNORMAL LOW (ref 34.0–46.6)
Hemoglobin: 10.7 g/dL — ABNORMAL LOW (ref 11.1–15.9)
MCH: 29.3 pg (ref 26.6–33.0)
MCHC: 32.9 g/dL (ref 31.5–35.7)
MCV: 89 fL (ref 79–97)
Platelets: 323 10*3/uL (ref 150–450)
RBC: 3.65 x10E6/uL — ABNORMAL LOW (ref 3.77–5.28)
RDW: 13 % (ref 11.7–15.4)
WBC: 5.4 10*3/uL (ref 3.4–10.8)

## 2019-05-04 LAB — IRON: Iron: 59 ug/dL (ref 27–159)

## 2019-05-10 ENCOUNTER — Ambulatory Visit (INDEPENDENT_AMBULATORY_CARE_PROVIDER_SITE_OTHER): Payer: BC Managed Care – PPO | Admitting: Internal Medicine

## 2019-05-10 ENCOUNTER — Encounter: Payer: Self-pay | Admitting: Internal Medicine

## 2019-05-10 ENCOUNTER — Other Ambulatory Visit: Payer: Self-pay

## 2019-05-10 VITALS — BP 120/78 | HR 84 | Temp 98.4°F | Ht 64.8 in | Wt 212.4 lb

## 2019-05-10 DIAGNOSIS — F329 Major depressive disorder, single episode, unspecified: Secondary | ICD-10-CM

## 2019-05-10 DIAGNOSIS — F5101 Primary insomnia: Secondary | ICD-10-CM | POA: Insufficient documentation

## 2019-05-10 DIAGNOSIS — F32A Depression, unspecified: Secondary | ICD-10-CM | POA: Insufficient documentation

## 2019-05-10 MED ORDER — SERTRALINE HCL 50 MG PO TABS: 50.0000 mg | ORAL_TABLET | Freq: Every day | ORAL | 0 refills | Status: DC

## 2019-05-10 MED ORDER — TRAZODONE HCL 50 MG PO TABS: ORAL_TABLET | ORAL | 0 refills | Status: DC

## 2019-05-10 NOTE — Progress Notes (Signed)
Subjective:     Patient ID: Sarah [name] , female    DOB: 13-Feb-1981 , 38 y.o.   MRN: [id number]   Chief Complaint  Patient presents with  . Follow-up    med check    HPI  Fu depression and insomnia. Was started on Zoloft last week, she is up to 50 mg qhs. Sleeps is not better yet. Takes her 5 hours to fall asleep and wakes up at 2 am. Has started taking her Fe one a day.   Past Medical History:  Diagnosis Date  . Headache   . Heart murmur   . Melanoma ([place]) 1996   on head  . Multiple sclerosis ([place])   . Multiple sclerosis ([place])      Family History  Problem Relation Age of Onset  . Diabetes Mother   . Diabetes Maternal Uncle   . Hypertension Maternal Grandmother      Current Outpatient Medications:  .  acetaminophen (TYLENOL) 500 MG tablet, Take 500 mg by mouth every 6 (six) hours as needed., Disp: , Rfl:  .  ocrelizumab 600 mg in sodium chloride 0.9 % 500 mL, Inject 600 mg every 6 (six) months into the vein., Disp: , Rfl:  .  sertraline (ZOLOFT) 25 MG tablet, One qhs for sleep and depression, may increase to 2 qhs after 3 days., Disp: 30 tablet, Rfl: 0   No Known Allergies   Review of Systems  Still stressed, poor sleep, not crying as much. + fatigue. No abdominal pain, diarrhea, dizziness, chest pain.  Today's Vitals   05/10/19 1402  BP: 120/78  Pulse: 84  Temp: 98.4 F (36.9 C)  TempSrc: Oral  Weight: 212 lb 6.4 oz (96.3 kg)  Height: 5' 4.8" (1.646 m)   Body mass index is 35.56 kg/m.    Objective:  Physical Exam   Constitutional: She is oriented to person, place, and time. She appears well-developed and well-nourished. No distress.  HENT:  Head: Normocephalic and atraumatic.  Right Ear: External ear normal.  Left Ear: External ear normal.  Nose: Nose normal.  Eyes: Conjunctivae are normal. Right eye exhibits no discharge. Left eye exhibits no discharge. No scleral icterus.  Neck: Neck supple. No thyromegaly present.  No carotid bruits  bilaterally  Cardiovascular: Normal rate and regular rhythm.  No murmur heard. Pulmonary/Chest: Effort normal and breath sounds normal. No respiratory distress.  Musculoskeletal: Normal range of motion. She exhibits no edema.  Lymphadenopathy:    She has no cervical adenopathy.  Neurological: She is alert and oriented to person, place, and time.  Skin: Skin is warm and dry. Capillary refill takes less than 2 seconds. No rash noted. She is not diaphoretic.  Psychiatric: She has sad affect, but did not cry today. Her affect is sad. Thought content normal.  Nursing note reviewed.     Assessment And Plan:     1. Depression, unspecified depression type- with slight improvement.     Needs to start taking it during the am.     I will have her try a curtesy Aromatherapy massage next time after her visit.   2. Primary insomnia- unresolved.       I will have her try Trazodone for a short time. I reviewed seratonin syndrome symptoms to watch out for if she chose to continue since both meds are low dose. She is willing to try them.    Kassidy Dockendorf RODRIGUEZ-SOUTHWORTH, PA-C    THE PATIENT IS ENCOURAGED TO PRACTICE SOCIAL DISTANCING DUE TO  THE COVID-19 PANDEMIC.

## 2019-05-24 ENCOUNTER — Other Ambulatory Visit: Payer: Self-pay

## 2019-05-24 ENCOUNTER — Encounter: Payer: Self-pay | Admitting: Internal Medicine

## 2019-05-24 ENCOUNTER — Ambulatory Visit (INDEPENDENT_AMBULATORY_CARE_PROVIDER_SITE_OTHER): Payer: BC Managed Care – PPO | Admitting: Internal Medicine

## 2019-05-24 VITALS — BP 118/76 | HR 66 | Temp 98.3°F | Ht 64.8 in | Wt 206.8 lb

## 2019-05-24 DIAGNOSIS — F3289 Other specified depressive episodes: Secondary | ICD-10-CM | POA: Diagnosis not present

## 2019-05-24 DIAGNOSIS — F5101 Primary insomnia: Secondary | ICD-10-CM | POA: Diagnosis not present

## 2019-05-24 MED ORDER — TRAZODONE HCL 50 MG PO TABS: ORAL_TABLET | ORAL | 0 refills | Status: DC

## 2019-05-24 NOTE — Progress Notes (Signed)
Subjective:     Patient ID: Sarah [name] , female    DOB: 03-09-1981 , 38 y.o.   MRN: [id number]   Chief Complaint  Patient presents with  . Medication Refill    wellbutrion f/u    HPI  Trazodone helped her sleep for the 1st week. But her sleep has not been good this week, admits she is under a lot of stress, and lays in bed with thoughts in her head dwelling. She ended up increasing her Trazodone to 50 mg qhs.  She only got 5-h per night this week. But the first week slept thought the night and felt rested, was less irritable and even her kids noticed it.    Has been taking the Zoloft 50 mg  in the am and is tolerating fine.   Past Medical History:  Diagnosis Date  . Headache   . Heart murmur   . Melanoma ([place]) 1996   on head  . Multiple sclerosis ([place])   . Multiple sclerosis ([place])      Family History  Problem Relation Age of Onset  . Diabetes Mother   . Diabetes Maternal Uncle   . Hypertension Maternal Grandmother      Current Outpatient Medications:  .  acetaminophen (TYLENOL) 500 MG tablet, Take 500 mg by mouth every 6 (six) hours as needed., Disp: , Rfl:  .  ocrelizumab 600 mg in sodium chloride 0.9 % 500 mL, Inject 600 mg every 6 (six) months into the vein., Disp: , Rfl:  .  sertraline (ZOLOFT) 50 MG tablet, Take 1 tablet (50 mg total) by mouth daily., Disp: 30 tablet, Rfl: 0 .  traZODone (DESYREL) 50 MG tablet, 1/2 to 1 qhs prn insomnia, Disp: 30 tablet, Rfl: 0   No Known Allergies   Review of Systems  Constitutional: Negative for appetite change and diaphoresis.  Gastrointestinal: Negative for abdominal pain, diarrhea and nausea.  Psychiatric/Behavioral: Negative for agitation, behavioral problems, confusion, dysphoric mood and suicidal ideas. The patient is not hyperactive.     Denies flushing or sweating or diarrhea. Having soft stools. Denies mood swings.  Today's Vitals   05/24/19 1621  BP: 118/76  Pulse: 66  Temp: 98.3 F (36.8 C)  TempSrc: Oral   Weight: 206 lb 12.8 oz (93.8 kg)  Height: 5' 4.8" (1.646 m)   Body mass index is 34.63 kg/m.   Objective:  Physical Exam Vitals signs and nursing note reviewed.  Constitutional:      General: She is not in acute distress.    Appearance: She is obese. She is not toxic-appearing.  HENT:     Head: Atraumatic.     Right Ear: External ear normal.     Left Ear: External ear normal.     Nose: Nose normal.  Eyes:     General: No scleral icterus.    Conjunctiva/sclera: Conjunctivae normal.  Neck:     Musculoskeletal: Neck supple.  Cardiovascular:     Rate and Rhythm: Normal rate and regular rhythm.     Heart sounds: Normal heart sounds.  Pulmonary:     Effort: Pulmonary effort is normal.     Breath sounds: Normal breath sounds.  Musculoskeletal: Normal range of motion.  Skin:    General: Skin is warm and dry.  Neurological:     Mental Status: She is alert and oriented to person, place, and time.  Psychiatric:        Behavior: Behavior normal.        Thought Content:  Thought content normal.        Judgment: Judgment normal.     Comments: Depressed affect     Assessment And Plan:    1. Other depression- minimally improved, but seems to get better if she sleeps well. I will keep her on the Zoloft 50 mg for x 2 weeks.   2. Primary insomnia- with some improvement with trazodone. I asked her to increase it to 100 mg qhs.   Fu in 2 weeks.  Debra Calabretta RODRIGUEZ-SOUTHWORTH, PA-C    THE PATIENT IS ENCOURAGED TO PRACTICE SOCIAL DISTANCING DUE TO THE COVID-19 PANDEMIC.

## 2019-06-05 ENCOUNTER — Ambulatory Visit (INDEPENDENT_AMBULATORY_CARE_PROVIDER_SITE_OTHER): Payer: BC Managed Care – PPO | Admitting: Psychology

## 2019-06-05 DIAGNOSIS — F411 Generalized anxiety disorder: Secondary | ICD-10-CM | POA: Diagnosis not present

## 2019-06-05 DIAGNOSIS — F331 Major depressive disorder, recurrent, moderate: Secondary | ICD-10-CM | POA: Diagnosis not present

## 2019-06-07 ENCOUNTER — Other Ambulatory Visit: Payer: Self-pay | Admitting: Internal Medicine

## 2019-06-07 ENCOUNTER — Telehealth: Payer: Self-pay

## 2019-06-07 ENCOUNTER — Other Ambulatory Visit: Payer: Self-pay

## 2019-06-07 MED ORDER — TRAZODONE HCL 100 MG PO TABS: 100.0000 mg | ORAL_TABLET | Freq: Every evening | ORAL | 0 refills | Status: DC | PRN

## 2019-06-07 MED ORDER — SERTRALINE HCL 50 MG PO TABS: 50.0000 mg | ORAL_TABLET | Freq: Every day | ORAL | 0 refills | Status: DC

## 2019-06-07 NOTE — Telephone Encounter (Signed)
Spoke with pt she is still taking 50mg  Trazodone but she still wakes up in the middle of the night will come in for an appt toward the middle of the month appt made

## 2019-06-07 NOTE — Progress Notes (Signed)
Please call pt and inform her that her pharmacy is requesting refill f Trazodone, but she was supposed to have a FU this week. Does she need a refill? And if so what dose is she taking?   Per Doroteo Bradford- pt OK on current Zoloft dose. Trazodone 50 mg not helping her stay asleep all night. I will increase her trazodone to 100 mg qhs.

## 2019-06-07 NOTE — Telephone Encounter (Signed)
Called pt to inform her of provider message pt vm full was not able to leave message for pt   Please call pt and inform her that her pharmacy is requesting refill f Trazodone, but she was supposed to have a FU this week. Does she need a refill? And if so what dose is she taking?

## 2019-06-08 NOTE — Telephone Encounter (Signed)
I switched her to 100 mg qhs which is what we discussed for her to take during last visit

## 2019-06-13 ENCOUNTER — Ambulatory Visit (INDEPENDENT_AMBULATORY_CARE_PROVIDER_SITE_OTHER): Payer: BC Managed Care – PPO | Admitting: Psychology

## 2019-06-13 DIAGNOSIS — F331 Major depressive disorder, recurrent, moderate: Secondary | ICD-10-CM

## 2019-06-14 ENCOUNTER — Ambulatory Visit: Payer: Self-pay | Admitting: Internal Medicine

## 2019-06-19 ENCOUNTER — Ambulatory Visit (INDEPENDENT_AMBULATORY_CARE_PROVIDER_SITE_OTHER): Payer: BC Managed Care – PPO | Admitting: Psychology

## 2019-06-19 DIAGNOSIS — F411 Generalized anxiety disorder: Secondary | ICD-10-CM

## 2019-06-19 DIAGNOSIS — F331 Major depressive disorder, recurrent, moderate: Secondary | ICD-10-CM | POA: Diagnosis not present

## 2019-06-26 ENCOUNTER — Ambulatory Visit (INDEPENDENT_AMBULATORY_CARE_PROVIDER_SITE_OTHER): Payer: BC Managed Care – PPO | Admitting: Psychology

## 2019-06-26 DIAGNOSIS — F331 Major depressive disorder, recurrent, moderate: Secondary | ICD-10-CM

## 2019-06-26 DIAGNOSIS — F411 Generalized anxiety disorder: Secondary | ICD-10-CM | POA: Diagnosis not present

## 2019-06-27 IMAGING — MG DIGITAL DIAGNOSTIC BILATERAL MAMMOGRAM WITH TOMO AND CAD
6 of 10 series · 6 of 30 positions shown · non-contrast
Comparison: Previous exam(s).
COMPARISON: Previous exam(s).

Addendum:
CLINICAL DATA: Bilateral palpable lumps.

EXAM:
DIGITAL DIAGNOSTIC BILATERAL MAMMOGRAM WITH CAD AND TOMO
ULTRASOUND BILATERAL BREAST

[R CC synth-2D]
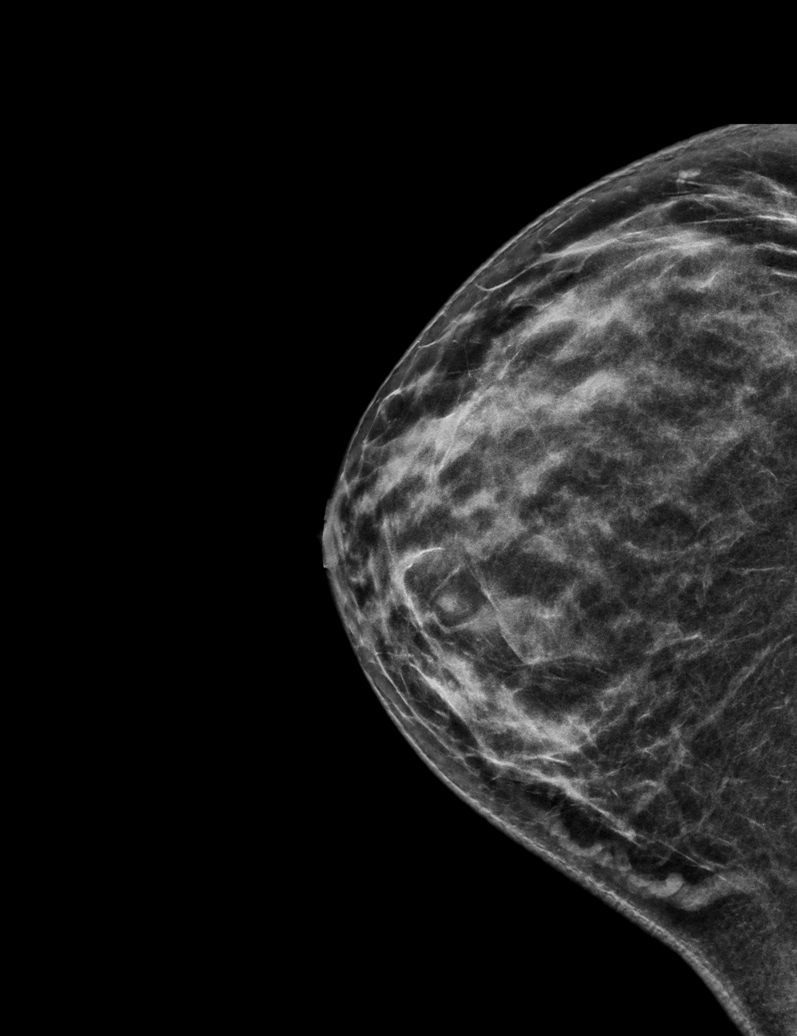

[L CC synth-2D (1 of 2)]
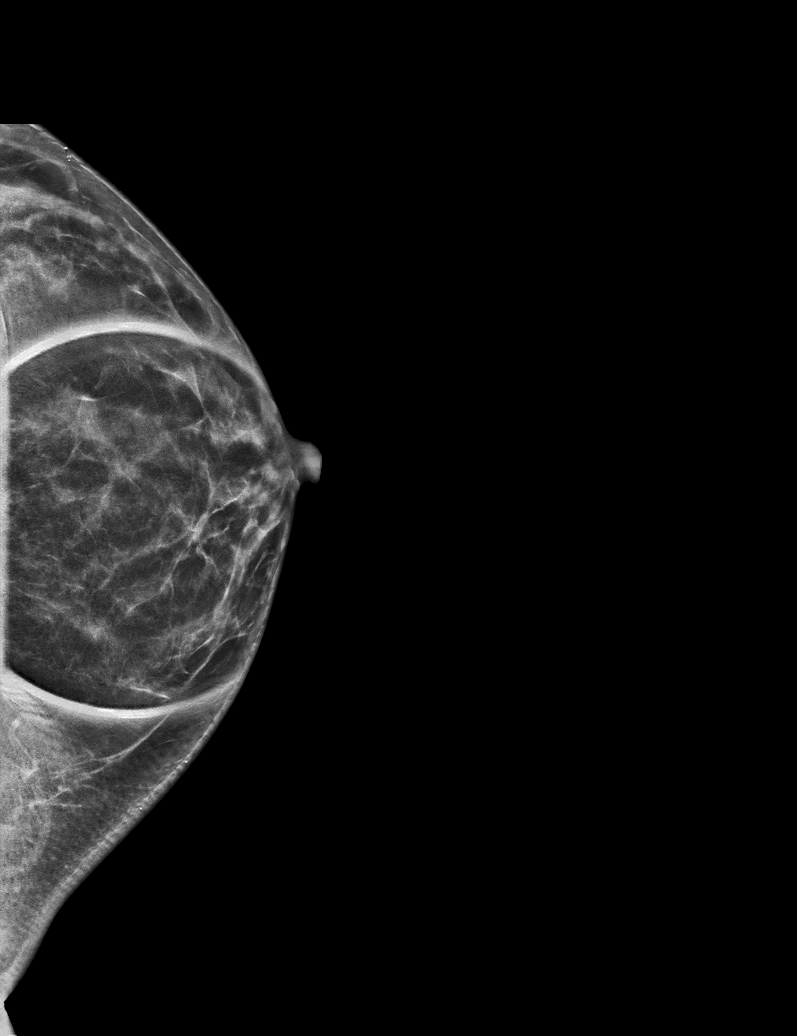

[R MLO synth-2D]
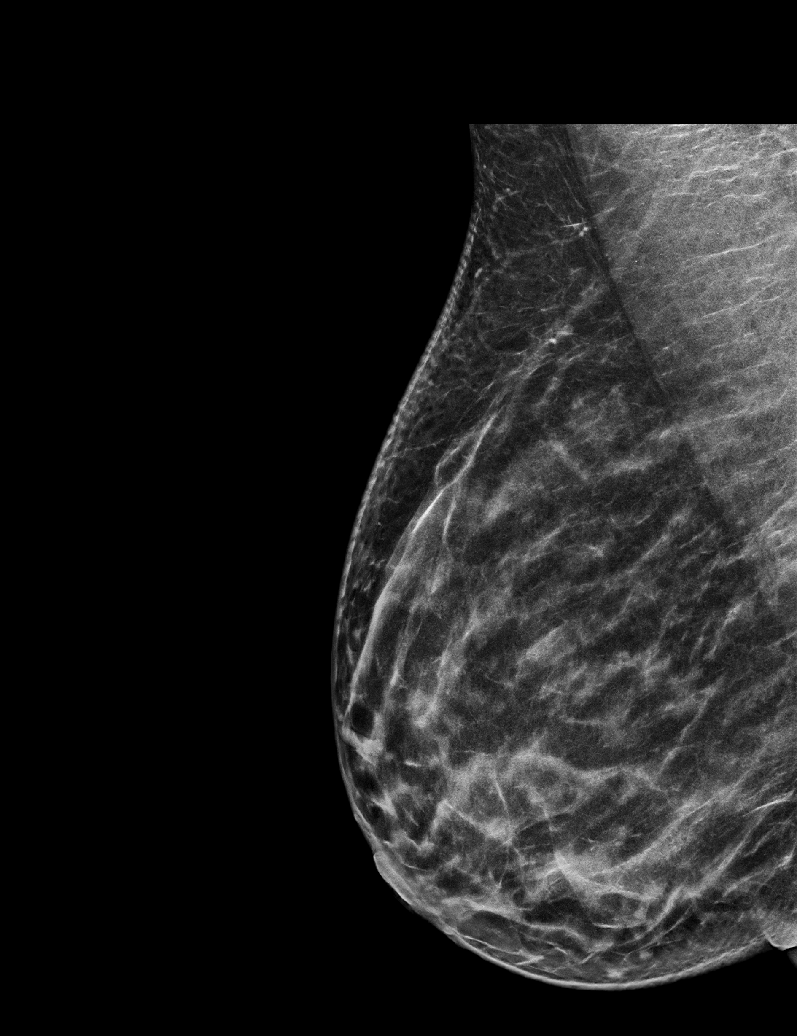

[L MLO synth-2D]
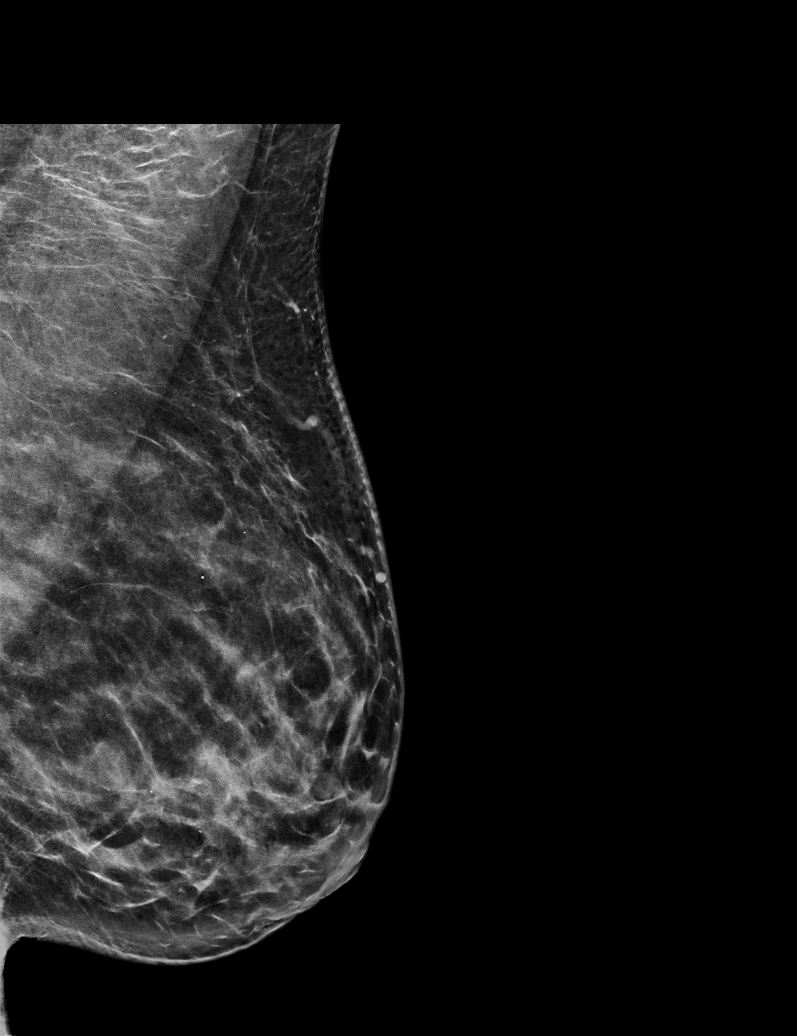

[L CC synth-2D (2 of 2)]
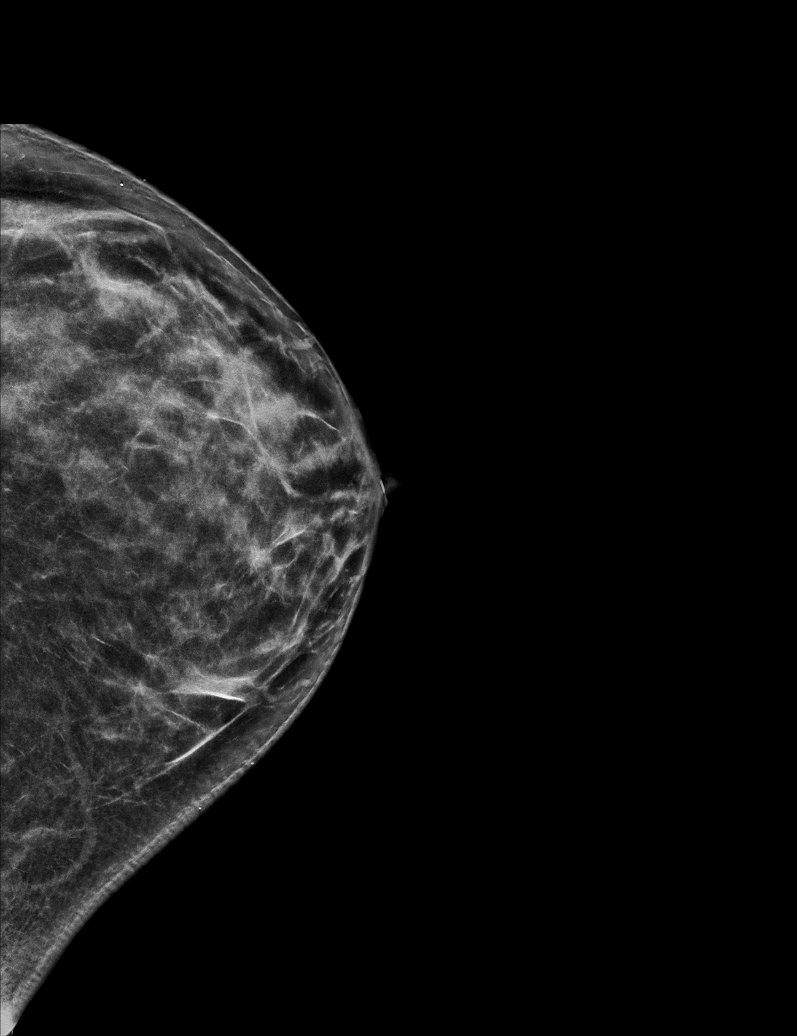

[L CC tomo · tomo slice 26/51.0]
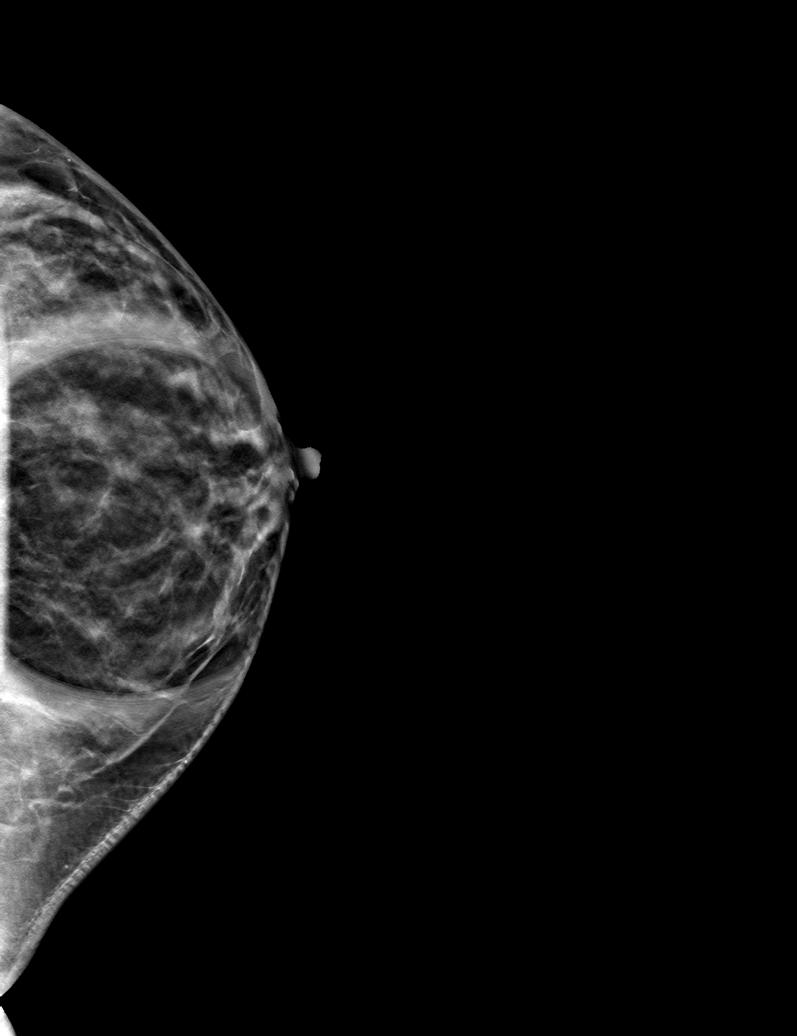

[6 of 30 positions shown; findings below may reference images not displayed]

ACR Breast Density Category c: The breast tissue is heterogeneously
dense, which may obscure small masses.
FINDINGS: No suspicious masses, calcifications, or distortion

Mammographic images were processed with CAD.

On physical exam, no suspicious lumps are identified.

Targeted ultrasound is performed, showing no sonographic
abnormalities in the regions of the palpable lumps bilaterally.
IMPRESSION: No mammographic or sonographic evidence of malignancy.

RECOMMENDATION:
Treatment of the patient's symptoms should be based on clinical and
physical exam given lack of imaging findings. Recommend annual
screening mammography beginning at the age of 40.

I have discussed the findings and recommendations with the patient.
Results were also provided in writing at the conclusion of the
visit. If applicable, a reminder letter will be sent to the patient
regarding the next appointment.

BI-RADS CATEGORY  1: Negative.

ADDENDUM:
This is an addendum to the previous report. There were no
comparisons available.

*** End of Addendum ***
ACR Breast Density Category c: The breast tissue is heterogeneously
dense, which may obscure small masses.
FINDINGS: No suspicious masses, calcifications, or distortion

Mammographic images were processed with CAD.

On physical exam, no suspicious lumps are identified.

Targeted ultrasound is performed, showing no sonographic
abnormalities in the regions of the palpable lumps bilaterally.
IMPRESSION: No mammographic or sonographic evidence of malignancy.

RECOMMENDATION:
Treatment of the patient's symptoms should be based on clinical and
physical exam given lack of imaging findings. Recommend annual
screening mammography beginning at the age of 40.

I have discussed the findings and recommendations with the patient.
Results were also provided in writing at the conclusion of the
visit. If applicable, a reminder letter will be sent to the patient
regarding the next appointment.

BI-RADS CATEGORY  1: Negative.

## 2019-06-28 ENCOUNTER — Other Ambulatory Visit: Payer: Self-pay

## 2019-06-28 ENCOUNTER — Encounter: Payer: Self-pay | Admitting: Internal Medicine

## 2019-06-28 ENCOUNTER — Ambulatory Visit (INDEPENDENT_AMBULATORY_CARE_PROVIDER_SITE_OTHER): Payer: BC Managed Care – PPO | Admitting: Internal Medicine

## 2019-06-28 VITALS — BP 120/78 | HR 80 | Temp 98.2°F | Ht 64.8 in | Wt 213.6 lb

## 2019-06-28 DIAGNOSIS — F3289 Other specified depressive episodes: Secondary | ICD-10-CM | POA: Diagnosis not present

## 2019-06-28 DIAGNOSIS — F5101 Primary insomnia: Secondary | ICD-10-CM | POA: Diagnosis not present

## 2019-06-28 DIAGNOSIS — D508 Other iron deficiency anemias: Secondary | ICD-10-CM | POA: Diagnosis not present

## 2019-06-28 DIAGNOSIS — Z79899 Other long term (current) drug therapy: Secondary | ICD-10-CM

## 2019-06-28 MED ORDER — OMEPRAZOLE 20 MG PO CPDR: 20.0000 mg | DELAYED_RELEASE_CAPSULE | Freq: Every day | ORAL | 0 refills | Status: DC

## 2019-06-28 MED ORDER — SERTRALINE HCL 100 MG PO TABS: 100.0000 mg | ORAL_TABLET | Freq: Every day | ORAL | 0 refills | Status: DC

## 2019-06-28 MED ORDER — ONDANSETRON 4 MG PO TBDP: 4.0000 mg | ORAL_TABLET | Freq: Three times a day (TID) | ORAL | 0 refills | Status: DC | PRN

## 2019-06-28 NOTE — Progress Notes (Signed)
Subjective:     Patient ID: Sarah [name] , female    DOB: 08/31/1980 , 38 y.o.   MRN: [id number]   Chief Complaint  Patient presents with  . med check    HPI Fu insomnia and had to increase the trazodone to 100 mg, and responded to trazodone 100 mg and slept well and did better.   Depression- sees therapist once a week, has seen him x 4. Has been practicing some techniques he told her to do when she gets overwhelmed is helping.   Denies diarrhea and sweats.   Past Medical History:  Diagnosis Date  . Headache   . Heart murmur   . Melanoma ([place]) 1996   on head  . Multiple sclerosis ([place])   . Multiple sclerosis ([place])      Family History  Problem Relation Age of Onset  . Diabetes Mother   . Diabetes Maternal Uncle   . Hypertension Maternal Grandmother      Current Outpatient Medications:  .  acetaminophen (TYLENOL) 500 MG tablet, Take 500 mg by mouth every 6 (six) hours as needed., Disp: , Rfl:  .  ocrelizumab 600 mg in sodium chloride 0.9 % 500 mL, Inject 600 mg every 6 (six) months into the vein., Disp: , Rfl:  .  sertraline (ZOLOFT) 50 MG tablet, Take 1 tablet (50 mg total) by mouth daily., Disp: 30 tablet, Rfl: 0 .  traZODone (DESYREL) 100 MG tablet, Take 1 tablet (100 mg total) by mouth at bedtime as needed for sleep., Disp: 30 tablet, Rfl: 0   No Known Allergies   Review of Systems  No diarrhea, N/ sweats, dizziness, light headed. Has felt orthostatic off and on. + fatigue, but has hx of anemia. The rest of 10 point ROS neg. Today's Vitals   06/28/19 1534  BP: 120/78  Pulse: 80  Temp: 98.2 F (36.8 C)  TempSrc: Oral  Weight: 213 lb 9.6 oz (96.9 kg)  Height: 5' 4.8" (1.646 m)   Body mass index is 35.76 kg/m.   Objective:  Physical Exam   Constitutional: She is oriented to person, place, and time. She appears well-developed and well-nourished. No distress.  HENT:  Head: Normocephalic and atraumatic.  Right Ear: External ear normal.  Left Ear:  External ear normal.  Nose: Nose normal.  Eyes: Conjunctivae are normal. Right eye exhibits no discharge. Left eye exhibits no discharge. No scleral icterus.  Neck: Neck supple. No thyromegaly present.  No carotid bruits bilaterally  Cardiovascular: Normal rate and regular rhythm.  No murmur heard. Pulmonary/Chest: Effort normal and breath sounds normal. No respiratory distress.  Musculoskeletal: Normal range of motion. She exhibits no edema.  Lymphadenopathy:    She has no cervical adenopathy.  Neurological: She is alert and oriented to person, place, and time.  Skin: Skin is warm and dry. Capillary refill takes less than 2 seconds. No rash noted. She is not diaphoretic.  Psychiatric: She has a normal mood and affect. Her behavior is normal. Judgment and thought content normal.  Nursing note reviewed.    Assessment And Plan:    1. Other iron deficiency anemia-chronic.  - CBC no Diff  2. Long-term use of high-risk medication-chronic - CMP14 + Anion Gap  3- Depression- stable on current dose of Zoloft. Will need to have a PHQ9 next visit. 4- Insomnia- improved. May continue same dose of Trazodone  FU in 3 months Bethany Hirt RODRIGUEZ-SOUTHWORTH, PA-C    THE PATIENT IS ENCOURAGED TO PRACTICE SOCIAL DISTANCING DUE  TO THE COVID-19 PANDEMIC.

## 2019-06-29 LAB — CMP14 + ANION GAP
ALT: 17 IU/L (ref 0–32)
AST: 14 IU/L (ref 0–40)
Albumin/Globulin Ratio: 1.5 (ref 1.2–2.2)
Albumin: 4.2 g/dL (ref 3.8–4.8)
Alkaline Phosphatase: 94 IU/L (ref 39–117)
Anion Gap: 15 mmol/L (ref 10.0–18.0)
BUN/Creatinine Ratio: 13 (ref 9–23)
BUN: 9 mg/dL (ref 6–20)
Bilirubin Total: <0.2 mg/dL (ref 0.0–1.2)
CO2: 23 mmol/L (ref 20–29)
Calcium: 9.5 mg/dL (ref 8.7–10.2)
Chloride: 101 mmol/L (ref 96–106)
Creatinine, Ser: 0.7 mg/dL (ref 0.57–1.00)
GFR calc Af Amer: 129 mL/min/{1.73_m2} (ref >59)
GFR calc non Af Amer: 112 mL/min/{1.73_m2} (ref >59)
Globulin, Total: 2.8 g/dL (ref 1.5–4.5)
Glucose: 89 mg/dL (ref 65–99)
Potassium: 4.4 mmol/L (ref 3.5–5.2)
Sodium: 139 mmol/L (ref 134–144)
Total Protein: 7 g/dL (ref 6.0–8.5)

## 2019-06-29 LAB — CBC
Hematocrit: 32.5 % — ABNORMAL LOW (ref 34.0–46.6)
Hemoglobin: 11.1 g/dL (ref 11.1–15.9)
MCH: 30.2 pg (ref 26.6–33.0)
MCHC: 34.2 g/dL (ref 31.5–35.7)
MCV: 89 fL (ref 79–97)
Platelets: 350 10*3/uL (ref 150–450)
RBC: 3.67 x10E6/uL — ABNORMAL LOW (ref 3.77–5.28)
RDW: 13.1 % (ref 11.7–15.4)
WBC: 8.4 10*3/uL (ref 3.4–10.8)

## 2019-07-02 ENCOUNTER — Encounter: Payer: Self-pay | Admitting: Internal Medicine

## 2019-07-03 ENCOUNTER — Ambulatory Visit (INDEPENDENT_AMBULATORY_CARE_PROVIDER_SITE_OTHER): Payer: BC Managed Care – PPO | Admitting: Psychology

## 2019-07-03 DIAGNOSIS — F411 Generalized anxiety disorder: Secondary | ICD-10-CM | POA: Diagnosis not present

## 2019-07-03 DIAGNOSIS — F331 Major depressive disorder, recurrent, moderate: Secondary | ICD-10-CM

## 2019-07-09 ENCOUNTER — Encounter: Payer: Self-pay | Admitting: Internal Medicine

## 2019-07-11 ENCOUNTER — Ambulatory Visit: Payer: BC Managed Care – PPO | Admitting: Psychology

## 2019-07-13 ENCOUNTER — Telehealth (HOSPITAL_COMMUNITY): Payer: Self-pay | Admitting: Internal Medicine

## 2019-07-13 NOTE — Telephone Encounter (Signed)
I called pt back about her question regarding the Prilosec and Zofran I sent by error. I apologized for my mistake and told her what those medications are used for. The Zofran could be used for nausea if when increasing the Fe this happens, which she said she does tend to get nausea when she increases her dose. If she ever gets heart burn, she may use the Prilosec. She seemed fine keeping those meds in hand. I will get back to her when I am back in the office so she can make apt.

## 2019-07-23 ENCOUNTER — Other Ambulatory Visit: Payer: Self-pay

## 2019-07-23 MED ORDER — SERTRALINE HCL 100 MG PO TABS: 100.0000 mg | ORAL_TABLET | Freq: Every day | ORAL | 0 refills | Status: DC

## 2019-07-24 DIAGNOSIS — G35 Multiple sclerosis: Secondary | ICD-10-CM | POA: Diagnosis not present

## 2019-07-26 ENCOUNTER — Other Ambulatory Visit (HOSPITAL_COMMUNITY): Payer: Self-pay | Admitting: Internal Medicine

## 2019-07-26 MED ORDER — SERTRALINE HCL 100 MG PO TABS: 100.0000 mg | ORAL_TABLET | Freq: Every day | ORAL | 0 refills | Status: DC

## 2019-07-26 NOTE — Progress Notes (Signed)
A fax was received for Request of Zoloft to be refilled.

## 2019-08-16 ENCOUNTER — Other Ambulatory Visit: Payer: Self-pay

## 2019-08-16 ENCOUNTER — Ambulatory Visit (INDEPENDENT_AMBULATORY_CARE_PROVIDER_SITE_OTHER): Payer: PRIVATE HEALTH INSURANCE | Admitting: Internal Medicine

## 2019-08-16 VITALS — BP 128/76 | HR 77 | Temp 98.3°F | Ht 65.2 in | Wt 213.2 lb

## 2019-08-16 DIAGNOSIS — F3289 Other specified depressive episodes: Secondary | ICD-10-CM

## 2019-08-16 DIAGNOSIS — G35 Multiple sclerosis: Secondary | ICD-10-CM | POA: Diagnosis not present

## 2019-08-16 DIAGNOSIS — F5101 Primary insomnia: Secondary | ICD-10-CM

## 2019-08-16 NOTE — Progress Notes (Signed)
This visit occurred during the SARS-CoV-2 public health emergency.  Safety protocols were in place, including screening questions prior to the visit, additional usage of staff PPE, and extensive cleaning of exam room while observing appropriate contact time as indicated for disinfecting solutions.  Subjective:     Patient ID: Sarah [name] , female    DOB: 01/14/1981 , 39 y.o.   MRN: [id number]   Chief Complaint  Patient presents with  . Depression    HPI Here for FU depression  And insomnia and I had increased dose of Zoloft and Trazodone. She has had to take the trazodone at midnight since it wears off too soon if she take it at 9 pm, so now when she takes it at mid night sleep though til she gets up at 6;30 am and fells fairly well rested. She denies having any SSRI symptoms.   Past Medical History:  Diagnosis Date  . Headache   . Heart murmur   . Melanoma ([place]) 1996   on head  . Multiple sclerosis ([place])   . Multiple sclerosis ([place])      Family History  Problem Relation Age of Onset  . Diabetes Mother   . Diabetes Maternal Uncle   . Hypertension Maternal Grandmother      Current Outpatient Medications:  .  acetaminophen (TYLENOL) 500 MG tablet, Take 500 mg by mouth every 6 (six) hours as needed., Disp: , Rfl:  .  ocrelizumab 600 mg in sodium chloride 0.9 % 500 mL, Inject 600 mg every 6 (six) months into the vein., Disp: , Rfl:  .  sertraline (ZOLOFT) 100 MG tablet, Take 1 tablet (100 mg total) by mouth daily., Disp: 90 tablet, Rfl: 0 .  traZODone (DESYREL) 100 MG tablet, Take 1 tablet (100 mg total) by mouth at bedtime as needed for sleep., Disp: 30 tablet, Rfl: 0 .  omeprazole (PRILOSEC) 20 MG capsule, Take 1 capsule (20 mg total) by mouth daily. For possible gastritis (Patient not taking: Reported on 08/16/2019), Disp: 30 capsule, Rfl: 0 .  ondansetron (ZOFRAN ODT) 4 MG disintegrating tablet, Take 1 tablet (4 mg total) by mouth every 8 (eight) hours as needed for nausea  or vomiting. (Patient not taking: Reported on 08/16/2019), Disp: 20 tablet, Rfl: 0   No Known Allergies   Review of Systems  Constitutional: Negative for activity change, appetite change, diaphoresis and unexpected weight change.  Respiratory: Negative for chest tightness and shortness of breath.   Cardiovascular: Negative for chest pain and leg swelling.  Gastrointestinal: Negative for diarrhea and nausea.  Genitourinary: Negative for difficulty urinating.  Skin: Negative for rash.  Hematological: Negative for adenopathy.     Today's Vitals   08/16/19 1648  BP: 128/76  Pulse: 77  Temp: 98.3 F (36.8 C)  Weight: 213 lb 3.2 oz (96.7 kg)  Height: 5' 5.2" (1.656 m)   Body mass index is 35.26 kg/m.   Objective:  Physical Exam   Constitutional: She is oriented to person, place, and time. She appears well-developed and well-nourished. No distress.  HENT:  Head: Normocephalic and atraumatic.  Right Ear: External ear normal.  Left Ear: External ear normal.  Nose: Nose normal.  Eyes: Conjunctivae are normal. Right eye exhibits no discharge. Left eye exhibits no discharge. No scleral icterus.  Neck: Neck supple. No thyromegaly present.  No carotid bruits bilaterally  Cardiovascular: Normal rate and regular rhythm.  No murmur heard. Pulmonary/Chest: Effort normal and breath sounds normal. No respiratory distress.  Musculoskeletal: Normal  range of motion. She exhibits no edema.  Lymphadenopathy:    She has no cervical adenopathy.  Neurological: She is alert and oriented to person, place, and time.  Skin: Skin is warm and dry. Capillary refill takes less than 2 seconds. No rash noted. She is not diaphoretic.  Psychiatric: She has a normal mood and affect. Her behavior is normal. Judgment and thought content normal.  Nursing note reviewed.  Assessment And Plan:    1. Primary insomnia- improved with Trazodone   May continue current med  2. Multiple sclerosis ([place])- chronic. Will  continue care with her neurologist  3. Other depression- improved with Zoloft. May continue current dose     FU in 3 months.    Mickaela Starlin RODRIGUEZ-SOUTHWORTH, PA-C    THE PATIENT IS ENCOURAGED TO PRACTICE SOCIAL DISTANCING DUE TO THE COVID-19 PANDEMIC.

## 2019-08-25 ENCOUNTER — Encounter: Payer: Self-pay | Admitting: Internal Medicine

## 2019-08-28 ENCOUNTER — Encounter: Payer: Self-pay | Admitting: Internal Medicine

## 2019-08-30 ENCOUNTER — Other Ambulatory Visit: Payer: Self-pay | Admitting: Internal Medicine

## 2019-08-30 NOTE — Telephone Encounter (Signed)
Trazodone refill is this ok

## 2019-09-02 ENCOUNTER — Other Ambulatory Visit: Payer: Self-pay | Admitting: Internal Medicine

## 2019-09-03 MED ORDER — TRAZODONE HCL 100 MG PO TABS: 100.0000 mg | ORAL_TABLET | Freq: Every evening | ORAL | 0 refills | Status: DC | PRN

## 2019-10-28 ENCOUNTER — Other Ambulatory Visit: Payer: Self-pay | Admitting: Internal Medicine

## 2019-11-15 ENCOUNTER — Ambulatory Visit: Payer: PRIVATE HEALTH INSURANCE | Admitting: Internal Medicine

## 2019-11-22 ENCOUNTER — Other Ambulatory Visit: Payer: Self-pay

## 2019-11-22 ENCOUNTER — Ambulatory Visit (INDEPENDENT_AMBULATORY_CARE_PROVIDER_SITE_OTHER): Payer: PRIVATE HEALTH INSURANCE | Admitting: Internal Medicine

## 2019-11-22 ENCOUNTER — Encounter: Payer: Self-pay | Admitting: Internal Medicine

## 2019-11-22 VITALS — BP 116/72 | HR 78 | Temp 98.3°F | Ht 65.2 in | Wt 215.0 lb

## 2019-11-22 DIAGNOSIS — F3289 Other specified depressive episodes: Secondary | ICD-10-CM

## 2019-11-22 DIAGNOSIS — N3942 Incontinence without sensory awareness: Secondary | ICD-10-CM

## 2019-11-22 DIAGNOSIS — Z79899 Other long term (current) drug therapy: Secondary | ICD-10-CM

## 2019-11-22 DIAGNOSIS — D509 Iron deficiency anemia, unspecified: Secondary | ICD-10-CM | POA: Diagnosis not present

## 2019-11-22 DIAGNOSIS — G4709 Other insomnia: Secondary | ICD-10-CM | POA: Diagnosis not present

## 2019-11-22 DIAGNOSIS — G35 Multiple sclerosis: Secondary | ICD-10-CM | POA: Diagnosis not present

## 2019-11-22 NOTE — Progress Notes (Signed)
This visit occurred during the SARS-CoV-2 public health emergency.  Safety protocols were in place, including screening questions prior to the visit, additional usage of staff PPE, and extensive cleaning of exam room while observing appropriate contact time as indicated for disinfecting solutions.  Subjective:     Patient ID: Sarah [name] , female    DOB: 1980-09-26 , 39 y.o.   MRN: [id number]   Chief Complaint  Patient presents with  . Insomnia    patient stated the trazodone was working well for her but she has stopped taking it for right now     HPI Pt is here for FU depression and insomnia.  She stopped the Trazodone and Zoloft in order to get Covid vacine on 4/24. She had a reaction when she took another injection while on these meds and she wants to avoid it. She had been doing well with the combination of both. She did taper off slowly from the Zoloft and denies SSRI symptoms.  Emotionally she does not feel she is doing as well this past week since of those meds. Has been having more arguments with her partner.   Past Medical History:  Diagnosis Date  . Headache   . Heart murmur   . Melanoma ([place]) 1996   on head  . Multiple sclerosis ([place])   . Multiple sclerosis ([place])      Family History  Problem Relation Age of Onset  . Diabetes Mother   . Diabetes Maternal Uncle   . Hypertension Maternal Grandmother      Current Outpatient Medications:  .  acetaminophen (TYLENOL) 500 MG tablet, Take 500 mg by mouth every 6 (six) hours as needed., Disp: , Rfl:  .  ocrelizumab 600 mg in sodium chloride 0.9 % 500 mL, Inject 600 mg every 6 (six) months into the vein., Disp: , Rfl:  .  sertraline (ZOLOFT) 100 MG tablet, TAKE 1 TABLET(100 MG) BY MOUTH DAILY, Disp: 90 tablet, Rfl: 0 .  traZODone (DESYREL) 100 MG tablet, Take 1 tablet (100 mg total) by mouth at bedtime as needed for sleep., Disp: 30 tablet, Rfl: 0   No Known Allergies   Review of Systems   + insumnia, denies sneezing  and rhinitis, denies GI issues, + incontinence, urgency as part of her MS, denies burning. No edema. Still dealing with poor memory. Admits while off Zoloft and trazodone in the past week she has been having more arguments with partner.  Today's Vitals   11/22/19 1434  BP: 116/72  Pulse: 78  Temp: 98.3 F (36.8 C)  TempSrc: Oral  Weight: 215 lb (97.5 kg)  Height: 5' 5.2" (1.656 m)  PainSc: 0-No pain   Body mass index is 35.56 kg/m.   Objective:  Physical Exam   Constitutional: She is oriented to person, place, and time. She appears well-developed and well-nourished. No distress.  HENT:  Head: Normocephalic and atraumatic.  Right Ear: External ear normal.  Left Ear: External ear normal.  Nose: Nose normal.  Eyes: Conjunctivae are normal. Right eye exhibits no discharge. Left eye exhibits no discharge. No scleral icterus.  Neck: Neck supple. No thyromegaly present.  No carotid bruits bilaterally  Cardiovascular: Normal rate and regular rhythm.  No murmur heard. Pulmonary/Chest: Effort normal and breath sounds normal. No respiratory distress.  Musculoskeletal: Normal range of motion. She exhibits no edema.  Lymphadenopathy:    She has no cervical adenopathy.  Neurological: She is alert and oriented to person, place, and time.  Skin: Skin is  warm and dry. Capillary refill takes less than 2 seconds. No rash noted. She is not diaphoretic.  Psychiatric: She has a normal mood and affect. Her behavior is normal. Judgment and thought content normal.  Nursing note reviewed.   Assessment And Plan:    1. Iron deficiency anemia, unspecified iron deficiency anemia type-chronic, but has been stable when taking her Fe qd.  - CBC no Diff - Iron 2. Long-term use of high-risk medication  - CMP14 + Anion Gap  3. Multiple sclerosis ([place])- chronic. Will continue FU's with her neurologist.  4. Other depression- stable while on Zoloft, little irritable while off it in the past week.   5.  Urinary incontinence without sensory awareness- chronic secondary to MS  6. Other insomnia-chronic, but improved with Trazodone.   FU in 3 months and will establish care with [facility].     Viktor Philipp RODRIGUEZ-SOUTHWORTH, PA-C    THE PATIENT IS ENCOURAGED TO PRACTICE SOCIAL DISTANCING DUE TO THE COVID-19 PANDEMIC.

## 2019-11-22 NOTE — Patient Instructions (Signed)
You may contact me at my [place] email:   [email or web address]

## 2019-11-23 ENCOUNTER — Encounter: Payer: Self-pay | Admitting: Internal Medicine

## 2019-11-23 LAB — CMP14 + ANION GAP
ALT: 12 IU/L (ref 0–32)
AST: 13 IU/L (ref 0–40)
Albumin/Globulin Ratio: 1.8 (ref 1.2–2.2)
Albumin: 4.8 g/dL (ref 3.8–4.8)
Alkaline Phosphatase: 98 IU/L (ref 39–117)
Anion Gap: 15 mmol/L (ref 10.0–18.0)
BUN/Creatinine Ratio: 12 (ref 9–23)
BUN: 10 mg/dL (ref 6–20)
Bilirubin Total: 0.3 mg/dL (ref 0.0–1.2)
CO2: 25 mmol/L (ref 20–29)
Calcium: 9.5 mg/dL (ref 8.7–10.2)
Chloride: 99 mmol/L (ref 96–106)
Creatinine, Ser: 0.85 mg/dL (ref 0.57–1.00)
GFR calc Af Amer: 101 mL/min/{1.73_m2} (ref >59)
GFR calc non Af Amer: 88 mL/min/{1.73_m2} (ref >59)
Globulin, Total: 2.6 g/dL (ref 1.5–4.5)
Glucose: 94 mg/dL (ref 65–99)
Potassium: 4.1 mmol/L (ref 3.5–5.2)
Sodium: 139 mmol/L (ref 134–144)
Total Protein: 7.4 g/dL (ref 6.0–8.5)

## 2019-11-23 LAB — CBC
Hematocrit: 36.4 % (ref 34.0–46.6)
Hemoglobin: 11.9 g/dL (ref 11.1–15.9)
MCH: 30 pg (ref 26.6–33.0)
MCHC: 32.7 g/dL (ref 31.5–35.7)
MCV: 92 fL (ref 79–97)
Platelets: 345 10*3/uL (ref 150–450)
RBC: 3.97 x10E6/uL (ref 3.77–5.28)
RDW: 12.6 % (ref 11.7–15.4)
WBC: 7.8 10*3/uL (ref 3.4–10.8)

## 2019-11-23 LAB — IRON: Iron: 69 ug/dL (ref 27–159)

## 2019-12-21 ENCOUNTER — Other Ambulatory Visit: Payer: Self-pay | Admitting: Internal Medicine

## 2019-12-22 MED ORDER — TRAZODONE HCL 100 MG PO TABS: 100.0000 mg | ORAL_TABLET | Freq: Every evening | ORAL | 0 refills | Status: DC | PRN

## 2019-12-22 NOTE — Telephone Encounter (Signed)
Refill

## 2020-01-08 ENCOUNTER — Telehealth: Payer: Self-pay

## 2020-01-08 NOTE — Telephone Encounter (Signed)
I called pt. She is overdue for an appt with Dr. Krista Blue or her NP. No answer, left a message asking her to call me back. If pt calls back please advise her of this and schedule pt for the follow up appt.

## 2020-02-12 ENCOUNTER — Ambulatory Visit: Payer: PRIVATE HEALTH INSURANCE | Admitting: Neurology

## 2020-02-12 ENCOUNTER — Telehealth: Payer: Self-pay | Admitting: *Deleted

## 2020-02-12 ENCOUNTER — Encounter: Payer: Self-pay | Admitting: Neurology

## 2020-02-12 NOTE — Telephone Encounter (Signed)
No showed follow up appointment. 

## 2020-02-28 ENCOUNTER — Ambulatory Visit: Payer: PRIVATE HEALTH INSURANCE | Admitting: Nurse Practitioner

## 2020-03-19 ENCOUNTER — Telehealth: Payer: Self-pay

## 2020-03-19 NOTE — Telephone Encounter (Signed)
I called pt and left her a v/m to call the office so we can schedule an appt YL,RMA

## 2020-03-24 ENCOUNTER — Telehealth: Payer: Self-pay

## 2020-03-24 NOTE — Telephone Encounter (Signed)
Called pt to schedule an appt

## 2020-03-31 ENCOUNTER — Ambulatory Visit: Payer: PRIVATE HEALTH INSURANCE | Admitting: Neurology

## 2020-05-20 ENCOUNTER — Ambulatory Visit: Payer: PRIVATE HEALTH INSURANCE | Admitting: Neurology

## 2020-08-05 ENCOUNTER — Telehealth: Payer: Self-pay | Admitting: Neurology

## 2020-08-05 NOTE — Telephone Encounter (Signed)
Error

## 2020-08-25 ENCOUNTER — Telehealth: Payer: Self-pay | Admitting: Neurology

## 2020-09-01 ENCOUNTER — Encounter: Payer: Self-pay | Admitting: Neurology

## 2020-09-01 ENCOUNTER — Telehealth (INDEPENDENT_AMBULATORY_CARE_PROVIDER_SITE_OTHER): Payer: PRIVATE HEALTH INSURANCE | Admitting: Neurology

## 2020-09-01 DIAGNOSIS — G35 Multiple sclerosis: Secondary | ICD-10-CM | POA: Diagnosis not present

## 2020-09-01 NOTE — Progress Notes (Signed)
Virtual Visit via Video Note  I connected with Sarah [name] on 09/01/20 at  3:15 PM EST by a video enabled telemedicine application and verified that I am speaking with the correct person using two identifiers.  Location: Patient: at her home  Provider: in the office   I discussed the limitations of evaluation and management by telemedicine and the availability of in person appointments. The patient expressed understanding and agreed to proceed.  History of Present Illness:  She had a history of relapsing remitting multiple sclerosis, the diagnosis was made in 2002, she presented with right optic neuritis, initially was treated at [facility] with steroid, later Betaseron, she did very well but she had a stillborn at week 44 while using Betaseron before and during pregnancy, she later had more pregnant first born in December 2004, second born August 2006, third child in April 2009 with twins, Betaseron was on hold during her pregnancy.  She had a major flareup in summer of 2007, she had 4 extremity paresthesia, weakness, difficulty walking, she was again put on steroid and Betaseron transiently, she could not tolerate the injection due to flulike illness, nausea or vomiting, and the cost factor. Betaseron was stopped,  Third major flareup was in January 2011, she had significant weakness, fall and paresthesia, difficulty walking driving for 2 months, Sarah [name] was discussed with her at that time, but because worried the side effect of PML, it was not started,  She was not on any treatment between 2011 and 2014.  Another flareup was in February 2014, she had right eye pain, blurry vision, right arm and leg weakness, increased gait abnormality, constipation, bladder incontinence, somewhat responsive to steroid,  MRI of the brain in 2015 showed multiple bilateral periventricular and subcortical MS lesions, MRI of cervical spine also demonstrate increased T2 at cervical cord at C2-3, C5,  left-sided lesion at C7. MRI of the thoracic spine showed diffuse cord abnormality at T1-T9, focal enhancement at ventral surface of the cord at T5-6, extending cephalocaudal fourth 12 mm,  JC virus antibody was negative with titer of 0.19 in May 2015, she was enrolled in research study was treated with Copaxone in April 2014, later she was rolled over to [place] since early 2014,  Her gait difficulty has much improved with Gilenya treatment,, but still with baseline bilateral lower extremity spasticity and paresthesia, last office visit was in May 2015, she has developed lower back cellulitis in April 2015, she was treated with antibiotics, she has stopped Gilenya for about 2 weeks, then had another flareup of aggressive worsening gait abnormality, right arm and leg weakness, paresthesia, also progressive worsening bilateral visual difficulty, severely restricted visual field, decreased visual acuity. During last office visit in May 2015, She was found to have severely restricted bilateral visual field, decreased visual acuity, counting fingers with right eye, left eye could only read large print, mild spastic quadriplegia, right worse than left, with motor strength of 4/5, ambulate with a wide based unsteady gait  She was sent to hospital admission in May 2015, her symptoms has much improved with high-dose of IV steroid followed by physical therapy, she has lost follow-ups since. 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 June 2015   She was pregnant since September 2016, had elective C-section on Dec 20 2015, with healthy girl  She presented to emergency room  on March 24 2016 for worsening bilateral hands feet numbness, unsteady gait, weakness, she started to have worsening paresthesia, bladder urgency, incontinence, increased  right eye blurry postpartum 6 week around June 2017, progressively worsened  I have personally reviewed repeat MRI of the brain, cervical spine with and without contrast in August 2017, in comparison with 2015, new enhancing nations in periventricular and subcortical white matter, splenium of corpus callosum consistent with active demyelinating, new enhancing cord lesion at C4 in the dorsal.  She was enrolled into MS search since Oct 2017, we have reviewed most recent MRI,in comparison to MRI of the brain with and without contrast in August 2017, and 2015, there was increase supratentorium lesion load, there was also 5 foci enhanced, which was not present in MRI of the brain in August 2017.  MRI of the cervical spine in August 2017 showed new enhancing cord lesion at C4,  She started research medication subcutaneous(CD3monoclonal antibody)every month plus pill(Abulgia).  She reported significant change at the end of April 2018, she developed double vision, dizziness, ataxic gait, she began IV Solu-Medrol infusion 1000 mg daily since Dec 14 2016, complete 3 days today, about 2 weeks after symptoms onset without significant improvement,  She denies signs of infection, reviewed laboratory evaluation showed hemoglobin 10 point 5, creatinine 0.71, cholesterol 156, triglycerides 193, UA on November 15 2016 was negative  History of Present Illness: Jan 04, 2019 Dr. Krista [name] She was switched to ocrelizumab since May 2018, tolerating the medication well, there was no longer flareup since the treatment, most recent infusion was on July 14, 2018, she works full-time, mildly unsteady gait   Update September 01, 2020 SS: Here today for follow-up via virtual visit, last Ocrevus was in July 2021, infusion was supposed to be last week but insurance would not authorize since overdue for follow-up.  MS overall stable, but can tell she needs her infusion.  Having more fatigue, heaviness of the legs with walking,  especially at the end of the day, word finding difficulty.  No falls.  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  Mild changes to the vision, related to not wearing her glasses.  Has urinary leakage, incontinence, wears adult briefs. Normal for her is numbness to her extremities in the morning, improves as the day goes on. Observations/Objective: Via virtual visit, is alert and oriented, speech is clear and concise, facial symmetry noted, EOM are normal Gait appears steady, moves all extremities well Very pleasant, well appearing  Assessment and Plan: 1.  Relapsing remitting multiple sclerosis  -On Ocrevus since May 2018  -MRI of the brain with and without contrast in June 2020 was overall stable compared to previous in 2018 (Moderate burden white matter lesion most compatible with her diagnosis of multiple sclerosis, predominantly in periventricular distribution, focal cortical/just cortical lesion involving the right superior precentral gyri, no contrast-enhancement)  -Overdue for next Ocrevus infusion, she will call to schedule, MS stable, but more symptoms since overdue for infusion  -Will check labs, come by the office to have drawn  -Will order MRI of the brain with and without contrast for routine surveillance  -Follow-up in 6 months or sooner if needed  Orders Placed This Encounter  Procedures  . MR BRAIN W WO CONTRAST  . CBC with Differential/Platelet  . CMP  . IgG, IgA, IgM   Follow Up Instructions: 6 months in office 02/02/2021 7:45 AM   I discussed the assessment and treatment plan with the patient. The patient was  provided an opportunity to ask questions and all were answered. The patient agreed with the plan and demonstrated an understanding of the instructions.   The patient was advised to call back or seek an in-person evaluation if the symptoms worsen or if the condition fails to improve as anticipated.  I spent 20 minutes of face-to-face and  non-face-to-face time with patient.  This included previsit chart review, lab review, study review, order entry, electronic health record documentation, patient education.  Evangeline Dakin, DNP  [facility] [address] [place], [place] [address] [phone]

## 2020-09-02 ENCOUNTER — Telehealth: Payer: Self-pay | Admitting: Neurology

## 2020-09-02 NOTE — Telephone Encounter (Signed)
medcost order sent to GI. They will obtain the auth and reach out to the patient to schedule.  

## 2020-09-11 NOTE — Progress Notes (Signed)
I have reviewed and agreed above plan. Will follow up her labs and MRIs.

## 2020-09-18 ENCOUNTER — Ambulatory Visit: Payer: PRIVATE HEALTH INSURANCE | Admitting: Neurology

## 2020-12-04 ENCOUNTER — Ambulatory Visit (INDEPENDENT_AMBULATORY_CARE_PROVIDER_SITE_OTHER): Payer: PRIVATE HEALTH INSURANCE | Admitting: Nurse Practitioner

## 2020-12-04 ENCOUNTER — Encounter: Payer: Self-pay | Admitting: Nurse Practitioner

## 2020-12-04 ENCOUNTER — Other Ambulatory Visit: Payer: Self-pay

## 2020-12-04 ENCOUNTER — Other Ambulatory Visit (HOSPITAL_COMMUNITY)
Admission: RE | Admit: 2020-12-04 | Discharge: 2020-12-04 | Disposition: A | Discharge status: Home / Self Care | Payer: PRIVATE HEALTH INSURANCE | Source: Ambulatory Visit | Attending: Nurse Practitioner | Admitting: Nurse Practitioner

## 2020-12-04 VITALS — BP 116/80 | HR 83 | Temp 98.6°F | Ht 65.2 in | Wt 206.8 lb

## 2020-12-04 DIAGNOSIS — Z124 Encounter for screening for malignant neoplasm of cervix: Secondary | ICD-10-CM | POA: Insufficient documentation

## 2020-12-04 DIAGNOSIS — Z113 Encounter for screening for infections with a predominantly sexual mode of transmission: Secondary | ICD-10-CM | POA: Insufficient documentation

## 2020-12-04 DIAGNOSIS — G35 Multiple sclerosis: Secondary | ICD-10-CM

## 2020-12-04 DIAGNOSIS — Z0000 Encounter for general adult medical examination without abnormal findings: Secondary | ICD-10-CM

## 2020-12-04 DIAGNOSIS — D509 Iron deficiency anemia, unspecified: Secondary | ICD-10-CM

## 2020-12-04 DIAGNOSIS — K5900 Constipation, unspecified: Secondary | ICD-10-CM | POA: Diagnosis not present

## 2020-12-04 DIAGNOSIS — R3981 Functional urinary incontinence: Secondary | ICD-10-CM | POA: Diagnosis not present

## 2020-12-04 MED ORDER — RESTORA PO CAPS: 1.0000 | ORAL_CAPSULE | Freq: Every day | ORAL | 3 refills | Status: AC

## 2020-12-04 MED ORDER — SUMATRIPTAN SUCCINATE 50 MG PO TABS: 50.0000 mg | ORAL_TABLET | Freq: Once | ORAL | 2 refills | Status: DC | PRN

## 2020-12-04 MED ORDER — FLUCONAZOLE 100 MG PO TABS: 100.0000 mg | ORAL_TABLET | Freq: Every day | ORAL | 0 refills | Status: DC

## 2020-12-04 NOTE — Progress Notes (Deleted)
I,Mykira Hofmeister [employer] as a Education administrator for [employer], FNP.,have documented all relevant documentation on the behalf of [name], FNP,as directed by  [name], FNP while in the presence of [name], [place]. This visit occurred during the SARS-CoV-2 public health emergency.  Safety protocols were in place, including screening questions prior to the visit, additional usage of staff PPE, and extensive cleaning of exam room while observing appropriate contact time as indicated for disinfecting solutions.  Subjective:     Patient ID: Sarah [name] , female    DOB: 06-07-1981 , 40 y.o.   MRN: [id number]   Chief Complaint  Patient presents with  . Annual Exam    HPI  Patient presents today for her physical. She seen Dr. Krista Blue telemedicine in January. She was having headaches at that time.    Wt Readings from Last 3 Encounters: 12/04/20 : 206 lb 12.8 oz (93.8 kg) 11/22/19 : 215 lb (97.5 kg) 08/16/19 : 213 lb 3.2 oz (96.7 kg)  She has not had any significant weight gain. She has nasal congestion at night when she is trying to lay down.      Past Medical History:  Diagnosis Date  . Headache   . Heart murmur   . Melanoma ([place]) 1996   on head  . Multiple sclerosis ([place])   . Multiple sclerosis ([place])      Family History  Problem Relation Age of Onset  . Diabetes Mother   . Diabetes Maternal Uncle   . Hypertension Maternal Grandmother      Current Outpatient Medications:  .  acetaminophen (TYLENOL) 500 MG tablet, Take 500 mg by mouth every 6 (six) hours as needed., Disp: , Rfl:  .  fluconazole (DIFLUCAN) 100 MG tablet, Take 1 tablet (100 mg total) by mouth daily. Take 1 tablet by mouth now repeat in 5 days, Disp: 2 tablet, Rfl: 0 .  ocrelizumab 600 mg in sodium chloride 0.9 % 500 mL, Inject 600 mg every 6 (six) months into the vein., Disp: , Rfl:  .  Probiotic Product (RESTORA) CAPS, Take 1 capsule by mouth daily., Disp: 30 capsule, Rfl: 3 .  SUMAtriptan (IMITREX) 50  MG tablet, Take 1 tablet (50 mg total) by mouth once as needed for migraine. May repeat in 2 hours if headache persists or recurs., Disp: 30 tablet, Rfl: 2 .  traZODone (DESYREL) 100 MG tablet, Take 1 tablet (100 mg total) by mouth at bedtime as needed for sleep., Disp: 30 tablet, Rfl: 0 .  Vitamin D, Ergocalciferol, (DRISDOL) 1.25 MG (50000 UNIT) CAPS capsule, Take 1 capsule (50,000 Units total) by mouth 2 (two) times a week., Disp: 24 capsule, Rfl: 1   No Known Allergies    The patient states she uses tubal ligation for birth control. Last LMP was Patient's last menstrual period was 11/21/2020.. Negative for Dysmenorrhea and Negative for Menorrhagia. Negative for: breast discharge, breast lump(s), breast pain and breast self exam. Associated symptoms include abnormal vaginal bleeding. Pertinent negatives include abnormal bleeding (hematology), anxiety, decreased libido, depression, difficulty falling sleep, dyspareunia, history of infertility, nocturia, sexual dysfunction, sleep disturbances, urinary incontinence, urinary urgency, vaginal discharge and vaginal itching. Diet regular.  The patient states her exercise level is minimal - with a gaming/fitness app.   The patient's tobacco use is:  Social History   Tobacco Use  Smoking Status Never Smoker  Smokeless Tobacco Never Used   She has been exposed to passive smoke. The patient's alcohol use is:  Social History  Substance and Sexual Activity  Alcohol Use Yes   Comment: socially   Additional information: Last pap 2020, next one scheduled for ***.    Review of Systems  Constitutional: Negative.   HENT: Negative.   Eyes: Negative.   Respiratory: Negative.   Cardiovascular: Negative.   Gastrointestinal: Negative.   Endocrine: Negative.   Genitourinary: Negative.   Musculoskeletal: Negative.   Skin: Negative.   Allergic/Immunologic: Negative.   Neurological: Positive for headaches (within the last 3-4 months, behind her eyes  into the middle of her nose.  Lasting about 3 days, with alternating tylenol, ibuprofen and aleve.  these are headaches on top of her regular headaches.  photophobia with her headaches.).  Hematological: Negative.   Psychiatric/Behavioral: Negative.      Today's Vitals   12/04/20 1405  BP: 116/80  Pulse: 83  Temp: 98.6 F (37 C)  TempSrc: Oral  Weight: 206 lb 12.8 oz (93.8 kg)  Height: 5' 5.2" (1.656 m)  PainSc: 0-No pain   Body mass index is 34.2 kg/m.   Objective:  Physical Exam      Assessment And Plan:     1. Encounter for general adult medical examination w/o abnormal findings - CMP14+EGFR - CBC - VITAMIN D 25 Hydroxy (Vit-D Deficiency, Fractures) - Lipid panel  2. Iron deficiency anemia, unspecified iron deficiency anemia type - Iron, TIBC and Ferritin Panel  3. Multiple sclerosis ([place]) - Ambulatory referral to Physical Therapy  4. Screening for STD (sexually transmitted disease) - Cervicovaginal ancillary only - HIV Antibody (routine testing w rflx) - T pallidum Screening [place] - Hepatitis B Surface Antigen  5. Encounter for Papanicolaou smear of cervix - Cytology -Pap Smear  6. Functional urinary incontinence  7. Constipation, unspecified constipation type     Patient was given opportunity to ask questions. Patient verbalized understanding of the plan and was able to repeat key elements of the plan. All questions were answered to their satisfaction.   [name], FNP   I, [name], FNP, have reviewed all documentation for this visit. The documentation on 12/22/20 for the exam, diagnosis, procedures, and orders are all accurate and complete.  THE PATIENT IS ENCOURAGED TO PRACTICE SOCIAL DISTANCING DUE TO THE COVID-19 PANDEMIC.

## 2020-12-04 NOTE — Patient Instructions (Signed)
Health Maintenance, Female Adopting a healthy lifestyle and getting preventive care are important in promoting health and wellness. Ask your health care provider about:  The right schedule for you to have regular tests and exams.  Things you can do on your own to prevent diseases and keep yourself healthy. What should I know about diet, weight, and exercise? Eat a healthy diet  Eat a diet that includes plenty of vegetables, fruits, low-fat dairy products, and lean protein.  Do not eat a lot of foods that are high in solid fats, added sugars, or sodium.   Maintain a healthy weight Body mass index (BMI) is used to identify weight problems. It estimates body fat based on height and weight. Your health care provider can help determine your BMI and help you achieve or maintain a healthy weight. Get regular exercise Get regular exercise. This is one of the most important things you can do for your health. Most adults should:  Exercise for at least 150 minutes each week. The exercise should increase your heart rate and make you sweat (moderate-intensity exercise).  Do strengthening exercises at least twice a week. This is in addition to the moderate-intensity exercise.  Spend less time sitting. Even light physical activity can be beneficial. Watch cholesterol and blood lipids Have your blood tested for lipids and cholesterol at 39 years of age, then have this test every 5 years. Have your cholesterol levels checked more often if:  Your lipid or cholesterol levels are high.  You are older than 40 years of age.  You are at high risk for heart disease. What should I know about cancer screening? Depending on your health history and family history, you may need to have cancer screening at various ages. This may include screening for:  Breast cancer.  Cervical cancer.  Colorectal cancer.  Skin cancer.  Lung cancer. What should I know about heart disease, diabetes, and high blood  pressure? Blood pressure and heart disease  High blood pressure causes heart disease and increases the risk of stroke. This is more likely to develop in people who have high blood pressure readings, are of African descent, or are overweight.  Have your blood pressure checked: ? Every 3-5 years if you are 18-39 years of age. ? Every year if you are 40 years old or older. Diabetes Have regular diabetes screenings. This checks your fasting blood sugar level. Have the screening done:  Once every three years after age 40 if you are at a normal weight and have a low risk for diabetes.  More often and at a younger age if you are overweight or have a high risk for diabetes. What should I know about preventing infection? Hepatitis B If you have a higher risk for hepatitis B, you should be screened for this virus. Talk with your health care provider to find out if you are at risk for hepatitis B infection. Hepatitis C Testing is recommended for:  Everyone born from 1945 through 1965.  Anyone with known risk factors for hepatitis C. Sexually transmitted infections (STIs)  Get screened for STIs, including gonorrhea and chlamydia, if: ? You are sexually active and are younger than 39 years of age. ? You are older than 39 years of age and your health care provider tells you that you are at risk for this type of infection. ? Your sexual activity has changed since you were last screened, and you are at increased risk for chlamydia or gonorrhea. Ask your health care provider   if you are at risk.  Ask your health care provider about whether you are at high risk for HIV. Your health care provider may recommend a prescription medicine to help prevent HIV infection. If you choose to take medicine to prevent HIV, you should first get tested for HIV. You should then be tested every 3 months for as long as you are taking the medicine. Pregnancy  If you are about to stop having your period (premenopausal) and  you may become pregnant, seek counseling before you get pregnant.  Take 400 to 800 micrograms (mcg) of folic acid every day if you become pregnant.  Ask for birth control (contraception) if you want to prevent pregnancy. Osteoporosis and menopause Osteoporosis is a disease in which the bones lose minerals and strength with aging. This can result in bone fractures. If you are 65 years old or older, or if you are at risk for osteoporosis and fractures, ask your health care provider if you should:  Be screened for bone loss.  Take a calcium or vitamin D supplement to lower your risk of fractures.  Be given hormone replacement therapy (HRT) to treat symptoms of menopause. Follow these instructions at home: Lifestyle  Do not use any products that contain nicotine or tobacco, such as cigarettes, e-cigarettes, and chewing tobacco. If you need help quitting, ask your health care provider.  Do not use street drugs.  Do not share needles.  Ask your health care provider for help if you need support or information about quitting drugs. Alcohol use  Do not drink alcohol if: ? Your health care provider tells you not to drink. ? You are pregnant, may be pregnant, or are planning to become pregnant.  If you drink alcohol: ? Limit how much you use to 0-1 drink a day. ? Limit intake if you are breastfeeding.  Be aware of how much alcohol is in your drink. In the [place] one drink equals one 12 oz bottle of beer (355 mL), one 5 oz glass of wine (148 mL), or one 1 oz glass of hard liquor (44 mL). General instructions  Schedule regular health, dental, and eye exams.  Stay current with your vaccines.  Tell your health care provider if: ? You often feel depressed. ? You have ever been abused or do not feel safe at home. Summary  Adopting a healthy lifestyle and getting preventive care are important in promoting health and wellness.  Follow your health care provider's instructions about healthy  diet, exercising, and getting tested or screened for diseases.  Follow your health care provider's instructions on monitoring your cholesterol and blood pressure. This information is not intended to replace advice given to you by your health care provider. Make sure you discuss any questions you have with your health care provider. Document Revised: 07/19/2018 Document Reviewed: 07/19/2018 Elsevier Patient Education  2021 [employer] Inc.  

## 2020-12-04 NOTE — Progress Notes (Signed)
This visit occurred during the SARS-CoV-2 public health emergency.  Safety protocols were in place, including screening questions prior to the visit, additional usage of staff PPE, and extensive cleaning of exam room while observing appropriate contact time as indicated for disinfecting solutions.  Subjective:     Patient ID: Sarah [name] , female    DOB: [birth year] , 40 y.o.   MRN: [id number]   Chief Complaint  Patient presents with  . Annual Exam    HPI  Sarah [name] presents for an annual wellness exam.  She is being followed by neurology for Sarah.  She has a history of heart murmur, iron deficiency anemia, urinary incontinence, headaches, and melanoma of the head.  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  She has 4 children aged 41, 15,13, 4.  She is divorced.  She drinks 1 glass of wine a week.  She does not smoke, but does use an edible occasionally for Sarah.  Family history includes cardiac disease in her maternal grandmother and diabetes in her mother and uncle.  Sarah [name] is sexually active.  Her last menstrual cycle began 11/21/2020.  She reports normal cycles. She is not completing self-breast exams.   She is not following a specific diet.  Her appetite has decreased over the last 3-4 months.  She is not exercising.  Her water intake is low due to concerns with urinary incontinence.   Sarah. [name] has complaints of a headaches over the last 3-4 months behind her eyes and nose.  The headaches last 3-4 days and are not relived with Tylenol or Ibuprofen.  Associated symptoms include nausea.      Past Medical History:  Diagnosis Date  . Headache   . Heart murmur   . Melanoma ([place]) 1996   on head  . Multiple sclerosis ([place])   . Multiple sclerosis ([place])      Family History  Problem Relation Age of Onset  . Diabetes Mother   . Diabetes Maternal Uncle   . Hypertension Maternal Grandmother      Current Outpatient Medications:  .  acetaminophen (TYLENOL) 500 MG  tablet, Take 500 mg by mouth every 6 (six) hours as needed., Disp: , Rfl:  .  fluconazole (DIFLUCAN) 100 MG tablet, Take 1 tablet (100 mg total) by mouth daily. Take 1 tablet by mouth now repeat in 5 days, Disp: 2 tablet, Rfl: 0 .  ocrelizumab 600 mg in sodium chloride 0.9 % 500 mL, Inject 600 mg every 6 (six) months into the vein., Disp: , Rfl:  .  Probiotic Product (RESTORA) CAPS, Take 1 capsule by mouth daily., Disp: 30 capsule, Rfl: 3 .  SUMAtriptan (IMITREX) 50 MG tablet, Take 1 tablet (50 mg total) by mouth once as needed for migraine. May repeat in 2 hours if headache persists or recurs., Disp: 30 tablet, Rfl: 2 .  traZODone (DESYREL) 100 MG tablet, Take 1 tablet (100 mg total) by mouth at bedtime as needed for sleep., Disp: 30 tablet, Rfl: 0 .  Vitamin D, Ergocalciferol, (DRISDOL) 1.25 MG (50000 UNIT) CAPS capsule, Take 1 capsule (50,000 Units total) by mouth 2 (two) times a week., Disp: 24 capsule, Rfl: 1   No Known Allergies   Review of Systems  Constitutional: Positive for appetite change (decreased).  Respiratory: Negative.   Cardiovascular: Negative.   Gastrointestinal: Positive for constipation (BMs Q4 days ).  Genitourinary:       Urinary incontinence   Neurological: Positive for headaches.  Today's Vitals   12/04/20 1405  BP: 116/80  Pulse: 83  Temp: 98.6 F (37 C)  TempSrc: Oral  Weight: 206 lb 12.8 oz (93.8 kg)  Height: 5' 5.2" (1.656 m)  PainSc: 0-No pain   Body mass index is 34.2 kg/m.   Objective:  Physical Exam Constitutional:      General: She is not in acute distress.    Appearance: Normal appearance. She is well-developed. She is obese.  HENT:     Head: Normocephalic and atraumatic.     Right Ear: Hearing, tympanic membrane, ear canal and external ear normal. There is no impacted cerumen.     Left Ear: Hearing, tympanic membrane, ear canal and external ear normal. There is no impacted cerumen.     Nose:     Comments: Deferred - masked     Mouth/Throat:     Comments: Deferred - masked Eyes:     General: Lids are normal.     Extraocular Movements: Extraocular movements intact.     Conjunctiva/sclera: Conjunctivae normal.     Pupils: Pupils are equal, round, and reactive to light.     Funduscopic exam:    Right eye: No papilledema.        Left eye: No papilledema.  Neck:     Thyroid: No thyroid mass.     Vascular: No carotid bruit.  Cardiovascular:     Rate and Rhythm: Normal rate and regular rhythm.     Pulses: Normal pulses.     Heart sounds: Normal heart sounds. No murmur heard.   Pulmonary:     Effort: Pulmonary effort is normal. No respiratory distress.     Breath sounds: Normal breath sounds. No wheezing.  Chest:     Chest wall: No mass.  Breasts:     Tanner Score is 5.     Right: Normal. No mass, tenderness, axillary adenopathy or supraclavicular adenopathy.     Left: Normal. No mass, tenderness, axillary adenopathy or supraclavicular adenopathy.    Abdominal:     General: Abdomen is flat. Bowel sounds are decreased. There is no distension.     Palpations: Abdomen is soft.     Tenderness: There is no abdominal tenderness.  Musculoskeletal:        General: No swelling. Normal range of motion.     Cervical back: Full passive range of motion without pain, normal range of motion and neck supple.     Right lower leg: No edema.     Left lower leg: No edema.  Lymphadenopathy:     Upper Body:     Right upper body: No supraclavicular, axillary or pectoral adenopathy.     Left upper body: No supraclavicular, axillary or pectoral adenopathy.  Skin:    General: Skin is warm and dry.     Capillary Refill: Capillary refill takes less than 2 seconds.  Neurological:     General: No focal deficit present.     Mental Status: She is alert and oriented to person, place, and time.     Cranial Nerves: No cranial nerve deficit.     Sensory: No sensory deficit.  Psychiatric:        Mood and Affect: Mood normal.         Behavior: Behavior normal.        Thought Content: Thought content normal.        Judgment: Judgment normal.         Assessment And Plan:     1. Encounter  for general adult medical examination w/o abnormal findings . Behavior modifications discussed and diet history reviewed.   . Pt will continue to exercise regularly and modify diet with low GI, plant based foods and decrease intake of processed foods.  . Recommend intake of daily multivitamin, Vitamin D, and calcium.  . Recommend for preventive screenings, as well as recommend immunizations that include influenza, TDAP - CMP14+EGFR - CBC - VITAMIN D 25 Hydroxy (Vit-D Deficiency, Fractures) - Lipid panel  2. Iron deficiency anemia, unspecified iron deficiency anemia type  Will check iron studies previous history of iron deficiency - Iron, TIBC and Ferritin Panel  3. Multiple sclerosis ([place])  She would like to go for PT to help with strengthening her lower extremities - Ambulatory referral to Physical Therapy  4. Screening for STD (sexually transmitted disease) - Cervicovaginal ancillary only - HIV Antibody (routine testing w rflx) - T pallidum Screening [facility] - Hepatitis B Surface Antigen  5. Encounter for Papanicolaou smear of cervix  Pap done no abnormal findings on physical exam - Cytology -Pap Smear  6. Functional urinary incontinence  Encouraged to do kegel exercises and bladder training   7. Constipation, unspecified constipation type  Discussed importance of eating a high fiber diet  Increase water intake   Patient was given opportunity to ask questions. Patient verbalized understanding of the plan and was able to repeat key elements of the plan. All questions were answered to their satisfaction.  [name], FNP   I, [name], FNP, have reviewed all documentation for this visit. The documentation on 12/04/20 for the exam, diagnosis, procedures, and orders are all accurate and complete.   IF YOU  HAVE BEEN REFERRED TO A SPECIALIST, IT MAY TAKE 1-2 WEEKS TO SCHEDULE/PROCESS THE REFERRAL. IF YOU HAVE NOT HEARD FROM US/SPECIALIST IN TWO WEEKS, PLEASE GIVE [place] A CALL AT [phone] X [phone].   THE PATIENT IS ENCOURAGED TO PRACTICE SOCIAL DISTANCING DUE TO THE COVID-19 PANDEMIC.

## 2020-12-05 ENCOUNTER — Encounter: Payer: Self-pay | Admitting: Nurse Practitioner

## 2020-12-05 LAB — CMP14+EGFR
ALT: 12 IU/L (ref 0–32)
AST: 10 IU/L (ref 0–40)
Albumin/Globulin Ratio: 1.5 (ref 1.2–2.2)
Albumin: 4.6 g/dL (ref 3.8–4.8)
Alkaline Phosphatase: 94 IU/L (ref 44–121)
BUN/Creatinine Ratio: 11 (ref 9–23)
BUN: 9 mg/dL (ref 6–20)
Bilirubin Total: <0.2 mg/dL (ref 0.0–1.2)
CO2: 26 mmol/L (ref 20–29)
Calcium: 9.5 mg/dL (ref 8.7–10.2)
Chloride: 101 mmol/L (ref 96–106)
Creatinine, Ser: 0.85 mg/dL (ref 0.57–1.00)
Globulin, Total: 3 g/dL (ref 1.5–4.5)
Glucose: 77 mg/dL (ref 65–99)
Potassium: 4.5 mmol/L (ref 3.5–5.2)
Sodium: 144 mmol/L (ref 134–144)
Total Protein: 7.6 g/dL (ref 6.0–8.5)
eGFR: 90 mL/min/{1.73_m2} (ref >59)

## 2020-12-05 LAB — CBC
Hematocrit: 36.4 % (ref 34.0–46.6)
Hemoglobin: 11.8 g/dL (ref 11.1–15.9)
MCH: 29.6 pg (ref 26.6–33.0)
MCHC: 32.4 g/dL (ref 31.5–35.7)
MCV: 92 fL (ref 79–97)
Platelets: 324 10*3/uL (ref 150–450)
RBC: 3.98 x10E6/uL (ref 3.77–5.28)
RDW: 12.9 % (ref 11.7–15.4)
WBC: 5.9 10*3/uL (ref 3.4–10.8)

## 2020-12-05 LAB — IRON,TIBC AND FERRITIN PANEL
Ferritin: 27 ng/mL (ref 15–150)
Iron Saturation: 14 % — ABNORMAL LOW (ref 15–55)
Iron: 51 ug/dL (ref 27–159)
Total Iron Binding Capacity: 357 ug/dL (ref 250–450)
UIBC: 306 ug/dL (ref 131–425)

## 2020-12-05 LAB — HEPATITIS B SURFACE ANTIGEN: Hepatitis B Surface Ag: NEGATIVE

## 2020-12-05 LAB — HIV ANTIBODY (ROUTINE TESTING W REFLEX): HIV Screen 4th Generation wRfx: NONREACTIVE

## 2020-12-05 LAB — LIPID PANEL
Chol/HDL Ratio: 3.9 ratio (ref 0.0–4.4)
Cholesterol, Total: 174 mg/dL (ref 100–199)
HDL: 45 mg/dL (ref >39)
LDL Chol Calc (NIH): 90 mg/dL (ref 0–99)
Triglycerides: 234 mg/dL — ABNORMAL HIGH (ref 0–149)
VLDL Cholesterol Cal: 39 mg/dL (ref 5–40)

## 2020-12-05 LAB — VITAMIN D 25 HYDROXY (VIT D DEFICIENCY, FRACTURES): Vit D, 25-Hydroxy: 9.5 ng/mL — ABNORMAL LOW (ref 30.0–100.0)

## 2020-12-05 LAB — T PALLIDUM SCREENING CASCADE: T pallidum Antibodies (TP-PA): NONREACTIVE

## 2020-12-08 LAB — CERVICOVAGINAL ANCILLARY ONLY
Bacterial Vaginitis (gardnerella): POSITIVE — AB
Candida Glabrata: NEGATIVE
Candida Vaginitis: NEGATIVE
Chlamydia: NEGATIVE
Comment: NEGATIVE
Comment: NEGATIVE
Comment: NEGATIVE
Comment: NEGATIVE
Comment: NEGATIVE
Comment: NORMAL
Neisseria Gonorrhea: NEGATIVE
Trichomonas: NEGATIVE

## 2020-12-09 LAB — CYTOLOGY - PAP
Adequacy: ABSENT
Comment: NEGATIVE
Diagnosis: NEGATIVE
High risk HPV: NEGATIVE

## 2020-12-10 ENCOUNTER — Other Ambulatory Visit: Payer: Self-pay | Admitting: Nurse Practitioner

## 2020-12-10 MED ORDER — METRONIDAZOLE 500 MG PO TABS: 500.0000 mg | ORAL_TABLET | Freq: Three times a day (TID) | ORAL | 0 refills | Status: AC

## 2020-12-10 MED ORDER — VITAMIN D (ERGOCALCIFEROL) 1.25 MG (50000 UNIT) PO CAPS: 50000.0000 [IU] | ORAL_CAPSULE | ORAL | 1 refills | Status: DC

## 2020-12-24 ENCOUNTER — Encounter: Payer: Self-pay | Admitting: Nurse Practitioner

## 2021-02-02 ENCOUNTER — Ambulatory Visit: Payer: PRIVATE HEALTH INSURANCE | Admitting: Neurology

## 2021-02-02 ENCOUNTER — Other Ambulatory Visit: Payer: Self-pay

## 2021-02-02 ENCOUNTER — Ambulatory Visit
Admission: RE | Admit: 2021-02-02 | Discharge: 2021-02-02 | Disposition: A | Discharge status: Home / Self Care | Payer: No Typology Code available for payment source | Source: Ambulatory Visit | Attending: Neurology | Admitting: Neurology

## 2021-02-02 DIAGNOSIS — G35 Multiple sclerosis: Secondary | ICD-10-CM

## 2021-02-02 MED ORDER — GADOBENATE DIMEGLUMINE 529 MG/ML IV SOLN
20.0000 mL | Freq: Once | INTRAVENOUS | Status: AC | PRN
  Administered 2021-02-02: 20 mL via INTRAVENOUS

## 2021-02-03 ENCOUNTER — Telehealth: Payer: Self-pay | Admitting: Neurology

## 2021-02-03 NOTE — Telephone Encounter (Signed)
Please call the patient. MRI of the brain appears stable, no new lesions compared to previous MRI on 2018. Please ensure she was able to get her Ocrevus scheduled, last saw her VV in Jan 2022, she was overdue. Also, do not see that she ever had blood work done.   IMPRESSION:   1.   Multiple T2/FLAIR hyperintense foci in the hemispheres and a couple small foci in the pons in a pattern consistent with chronic demyelinating plaque consistent with multiple sclerosis.  None of the foci appear to be acute.  They do not enhance.  Compared to the MRI dated 05/02/2017, there are no new lesions. 2.   Chronic pansinusitis as described above. 3.   No acute findings.  Normal enhancement pattern.

## 2021-02-04 NOTE — Telephone Encounter (Signed)
LVM for call back

## 2021-02-07 ENCOUNTER — Other Ambulatory Visit: Payer: No Typology Code available for payment source

## 2021-02-10 NOTE — Telephone Encounter (Signed)
MRI brain results discussed with the patient. She will come to our office to compete her lab work. Her next Ocrevus infusion is scheduled on 04/06/21.

## 2021-03-03 ENCOUNTER — Other Ambulatory Visit: Payer: Self-pay | Admitting: Neurology

## 2021-03-03 ENCOUNTER — Other Ambulatory Visit: Payer: No Typology Code available for payment source

## 2021-03-03 DIAGNOSIS — G35 Multiple sclerosis: Secondary | ICD-10-CM

## 2021-03-05 LAB — COMPREHENSIVE METABOLIC PANEL
ALT: 7 IU/L (ref 0–32)
AST: 9 IU/L (ref 0–40)
Albumin/Globulin Ratio: 1.6 (ref 1.2–2.2)
Albumin: 4.3 g/dL (ref 3.8–4.8)
Alkaline Phosphatase: 84 IU/L (ref 44–121)
BUN/Creatinine Ratio: 12 (ref 9–23)
BUN: 8 mg/dL (ref 6–20)
Bilirubin Total: <0.2 mg/dL (ref 0.0–1.2)
CO2: 26 mmol/L (ref 20–29)
Calcium: 9.1 mg/dL (ref 8.7–10.2)
Chloride: 102 mmol/L (ref 96–106)
Creatinine, Ser: 0.68 mg/dL (ref 0.57–1.00)
Globulin, Total: 2.7 g/dL (ref 1.5–4.5)
Glucose: 80 mg/dL (ref 65–99)
Potassium: 4 mmol/L (ref 3.5–5.2)
Sodium: 140 mmol/L (ref 134–144)
Total Protein: 7 g/dL (ref 6.0–8.5)
eGFR: 114 mL/min/{1.73_m2} (ref >59)

## 2021-03-05 LAB — CBC WITH DIFFERENTIAL/PLATELET
Basophils Absolute: 0 10*3/uL (ref 0.0–0.2)
Basos: 1 %
EOS (ABSOLUTE): 0.8 10*3/uL — ABNORMAL HIGH (ref 0.0–0.4)
Eos: 12 %
Hematocrit: 33.1 % — ABNORMAL LOW (ref 34.0–46.6)
Hemoglobin: 11 g/dL — ABNORMAL LOW (ref 11.1–15.9)
Immature Grans (Abs): 0 10*3/uL (ref 0.0–0.1)
Immature Granulocytes: 0 %
Lymphocytes Absolute: 1.8 10*3/uL (ref 0.7–3.1)
Lymphs: 25 %
MCH: 29.6 pg (ref 26.6–33.0)
MCHC: 33.2 g/dL (ref 31.5–35.7)
MCV: 89 fL (ref 79–97)
Monocytes Absolute: 0.8 10*3/uL (ref 0.1–0.9)
Monocytes: 11 %
Neutrophils Absolute: 3.8 10*3/uL (ref 1.4–7.0)
Neutrophils: 51 %
Platelets: 329 10*3/uL (ref 150–450)
RBC: 3.71 x10E6/uL — ABNORMAL LOW (ref 3.77–5.28)
RDW: 12.5 % (ref 11.7–15.4)
WBC: 7.2 10*3/uL (ref 3.4–10.8)

## 2021-03-05 LAB — IGG, IGA, IGM
IgA/Immunoglobulin A, Serum: 464 mg/dL — ABNORMAL HIGH (ref 87–352)
IgG (Immunoglobin G), Serum: 983 mg/dL (ref 586–1602)
IgM (Immunoglobulin M), Srm: 47 mg/dL (ref 26–217)

## 2021-03-30 ENCOUNTER — Ambulatory Visit (INDEPENDENT_AMBULATORY_CARE_PROVIDER_SITE_OTHER): Payer: No Typology Code available for payment source | Admitting: Neurology

## 2021-03-30 ENCOUNTER — Encounter: Payer: Self-pay | Admitting: Neurology

## 2021-03-30 VITALS — BP 135/89 | HR 75 | Ht 65.5 in | Wt 209.0 lb

## 2021-03-30 DIAGNOSIS — F32A Depression, unspecified: Secondary | ICD-10-CM

## 2021-03-30 DIAGNOSIS — R5383 Other fatigue: Secondary | ICD-10-CM | POA: Insufficient documentation

## 2021-03-30 DIAGNOSIS — G35 Multiple sclerosis: Secondary | ICD-10-CM | POA: Diagnosis not present

## 2021-03-30 DIAGNOSIS — G43709 Chronic migraine without aura, not intractable, without status migrainosus: Secondary | ICD-10-CM | POA: Insufficient documentation

## 2021-03-30 MED ORDER — VENLAFAXINE HCL ER 75 MG PO CP24
75.0000 mg | ORAL_CAPSULE | Freq: Every day | ORAL | 11 refills | Status: DC
Start: 2021-03-30 — End: 2021-09-30

## 2021-03-30 MED ORDER — SUMATRIPTAN SUCCINATE 100 MG PO TABS: 100.0000 mg | ORAL_TABLET | Freq: Once | ORAL | 1 refills | Status: DC | PRN

## 2021-03-30 MED ORDER — MODAFINIL 100 MG PO TABS: 100.0000 mg | ORAL_TABLET | Freq: Every day | ORAL | 5 refills | Status: DC

## 2021-03-30 NOTE — Progress Notes (Signed)
Chief Complaint  Patient presents with   Follow-up    New room - alone. Six month MS follow up. Still getting Ocrevus. No new concerns today. Feels she is doing well.       ASSESSMENT AND PLAN  Sarah [name] is a 40 y.o. female   Relapsing remitting multiple sclerosis  Stable since starting ocrelizumab in 2017, tolerating it well,  IgG IgM within normal limit  Repeat MRI of the brain in June 2022 showed no new lesions, stable Depression Fatigue  Add on provigil '100mg'$  as needed  Chronic migraine  Add on Effexor XR 75 mg every morning for depression and migraine prevention  Trazodone '100mg'$  every night for sleep  Imitrex '100mg'$  as needed for migraine, May combine with Aleve.   DIAGNOSTIC DATA (LABS, IMAGING, TESTING) - I reviewed patient records, labs, notes, testing and imaging myself where available.  MRI of brain w/wo on February 02 2021:  1.   Multiple T2/FLAIR hyperintense foci in the hemispheres and a couple small foci in the pons in a pattern consistent with chronic demyelinating plaque consistent with multiple sclerosis.  None of the foci appear to be acute.  They do not enhance.  Compared to the MRI dated 05/02/2017, there are no new lesions. 2.   Chronic pansinusitis as described above. 3.   No acute findings.  Normal enhancement pattern.  MRI brain and cervical in August 2017 1. Several new enhancing lesions in periventricular and subcortical white matter as well as the splenium of corpus callosum with single diffusion restricting lesion and left frontal subcortical white matter in comparison with the prior MRI of the brain consistent with active demyelination. 2. New enhancing cord lesion at C4 in the dorsal column consistent with active demyelination. 3. Several additional stable supratentorial, infratentorial, and cervical demyelinating plaques again noted. 4. Probable atrophy of the optic nerves bilaterally and question of increased T2 signal in the right optic  nerve which may represent sequelae of demyelination.   Laboratory evaluations in July 2022, Ig G, Ig M within normal limit, Ig A was mildly elevated 464, normal CMP, creat 0.68,  CBC, Hg 11.0. Ferritin 27, lipid panel LDL 90.  MEDICAL HISTORY:   She had a history of relapsing remitting multiple sclerosis, the diagnosis was made in 2002, she presented with right optic neuritis, initially was treated at [facility] with steroid, later Betaseron, she did very well but she had a stillborn at week 46 while using Betaseron before and during pregnancy, she later had more pregnant first born in December 2004, second born August 2006, third child in April 2009 with twins, Betaseron was on hold during her pregnancy.   She had a major flareup in summer of 2007, she had 4 extremity paresthesia, weakness, difficulty walking, she was again put on steroid and Betaseron transiently, she could not tolerate the injection due to flulike illness, nausea or vomiting, and the cost factor. Betaseron was stopped,   Third major flareup was in January 2011, she had significant weakness, fall and paresthesia, difficulty walking driving for 2 months, Dwyane Dee was discussed with her at that time, but because worried the side effect of PML, it was not started,   She was not on any treatment between 2011 and 2014.   Another flareup was in February 2014, she had right eye pain, blurry vision, right arm and leg weakness, increased gait abnormality, constipation, bladder incontinence, somewhat responsive to steroid,   MRI of the brain in 2015 showed multiple bilateral periventricular  and subcortical MS lesions, MRI of cervical spine also demonstrate increased T2 at cervical cord at C2-3, C5, left-sided lesion at C7. MRI of the thoracic spine showed diffuse cord abnormality at T1-T9, focal enhancement at ventral surface of the cord at T5-6, extending cephalocaudal fourth 12 mm,   JC virus antibody was negative with titer of 0.19 in May  2015, she was enrolled in research study was treated with Copaxone in April 2014, later she was rolled over to [place] since early 2014,    Her gait difficulty has much improved with Gilenya treatment,, but still with baseline bilateral lower extremity spasticity and paresthesia, last office visit was in May 2015, she has developed lower back cellulitis in April 2015, she was treated with antibiotics, she has stopped Gilenya for about 2 weeks, then had another flareup of aggressive worsening gait abnormality, right arm and leg weakness, paresthesia, also progressive worsening bilateral visual difficulty, severely restricted visual field, decreased visual acuity. During last office visit in May 2015, She was found to have severely restricted bilateral visual field, decreased visual acuity, counting fingers with right eye, left eye could only read large print, mild spastic quadriplegia, right worse than left, with motor strength of 4/5, ambulate with a wide based unsteady gait   She was sent to hospital admission in May 2015, her symptoms has much improved with high-dose of IV steroid followed by physical therapy, she has lost follow-ups since. 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 June 2015     She was pregnant since September 2016,  had elective C-section on Dec 20 2015, with healthy girl   She presented to emergency room on March 24 2016 for  worsening bilateral hands feet numbness, unsteady gait, weakness, she started to have worsening paresthesia, bladder urgency, incontinence, increased right eye blurry postpartum 6 week around June 2017, progressively worsened   I have personally reviewed repeat MRI of the brain, cervical spine with and without contrast in August 2017, in comparison with 2015, new enhancing nations in  periventricular and subcortical white matter, splenium of corpus callosum consistent with active demyelinating, new enhancing cord lesion at C4 in the dorsal.   She was enrolled into MS search since Oct 2017, we have reviewed most recent MRI, in comparison to MRI of the brain with and without contrast in August 2017, and 2015, there was increase supratentorium lesion load, there was also 5 foci enhanced, which was not present in MRI of the brain in August 2017.   MRI of the cervical spine in August 2017 showed new enhancing cord lesion at C4,   She started research medication subcutaneous (CD20 monoclonal antibody) every month plus pill (Abulgia).    She reported significant change at the end of April 2018, she developed double vision, dizziness, ataxic gait, she began IV Solu-Medrol infusion 1000 mg daily since Dec 14 2016, complete 3 days today, about 2 weeks after symptoms onset without significant improvement,   She denies signs of infection, reviewed laboratory evaluation showed hemoglobin 10 point 5, creatinine 0.71, cholesterol 156, triglycerides 193, UA on November 15 2016 was negative   UPDATE March 30 2021: She is overall stable, ocrelizumab was started since fall 2017, she works from home, continue has mild baseline gait abnormality, lower extremity spasticity,  She also complains of increased fatigue, chronic migraine headaches about twice a week,  mild depression,   PHYSICAL EXAM:   Vitals:   03/30/21 0902  BP: 135/89  Pulse: 75  Weight: 209 lb (94.8 kg)  Height: 5' 5.5" (1.664 m)   Not recorded     Body mass index is 34.25 kg/m.  PHYSICAL EXAMNIATION:  Gen: NAD, conversant, well nourised, well groomed           NEUROLOGICAL EXAM:  MENTAL STATUS: Speech:    Speech is normal; fluent and spontaneous with normal comprehension.  Cognition:     Orientation to time, place and person     Normal recent and remote memory     Normal Attention span and concentration      Normal Language, naming, repeating,spontaneous speech     Fund of knowledge   CRANIAL NERVES: CN II: Visual fields are full to confrontation. Pupils are round equal and briskly reactive to light. CN III, IV, VI: extraocular movement are normal. No ptosis. CN V: Facial sensation is intact to light touch CN VII: Face is symmetric with normal eye closure  CN VIII: Hearing is normal to causal conversation. CN IX, X: Phonation is normal. CN XI: Head turning and shoulder shrug are intact  MOTOR: Moderate spasticity of lower extremity, no significant muscle weakness  REFLEXES: Reflexes are 2+ and symmetric at the biceps, triceps, knees, and ankles. Plantar responses are flexor.  SENSORY: Intact to light touch, pinprick and vibratory sensation are intact in fingers and toes.  COORDINATION: There is no trunk or limb dysmetria noted.  GAIT/STANCE: She needs push-up to get up from seated position, cautious, mildly unsteady gait  REVIEW OF SYSTEMS:  Full 14 system review of systems performed and notable only for as above All other review of systems were negative.   ALLERGIES: No Known Allergies  HOME MEDICATIONS: Current Outpatient Medications  Medication Sig Dispense Refill   acetaminophen (TYLENOL) 500 MG tablet Take 500 mg by mouth every 6 (six) hours as needed.     ocrelizumab 600 mg in sodium chloride 0.9 % 500 mL Inject 600 mg every 6 (six) months into the vein.     Probiotic Product (RESTORA) CAPS Take 1 capsule by mouth daily. 30 capsule 3   SUMAtriptan (IMITREX) 50 MG tablet Take 1 tablet (50 mg total) by mouth once as needed for migraine. May repeat in 2 hours if headache persists or recurs. 30 tablet 2   traZODone (DESYREL) 100 MG tablet Take 1 tablet (100 mg total) by mouth at bedtime as needed for sleep. 30 tablet 0   Vitamin D, Ergocalciferol, (DRISDOL) 1.25 MG (50000 UNIT) CAPS capsule Take 1 capsule (50,000 Units total) by mouth 2 (two) times a week. 24 capsule 1   No  current facility-administered medications for this visit.    PAST MEDICAL HISTORY: Past Medical History:  Diagnosis Date   Headache    Heart murmur    Melanoma ([place]) 1996   on head   Multiple sclerosis ([place])    Multiple sclerosis ([place])     PAST SURGICAL HISTORY: Past Surgical History:  Procedure Laterality Date   CESAREAN SECTION     x4   CESAREAN SECTION WITH BILATERAL TUBAL LIGATION N/A 12/20/2015   Procedure: CESAREAN SECTION WITH BILATERAL TUBAL LIGATION;  Surgeon: Lavonia Drafts, MD;  Location: [facility];  Service: Obstetrics;  Laterality: N/A;   MELANOMA EXCISION  age 48   on head   TUBAL LIGATION  2017    FAMILY HISTORY: Family History  Problem Relation Age of Onset  Diabetes Mother    Diabetes Maternal Uncle    Hypertension Maternal Grandmother     SOCIAL HISTORY: Social History   Socioeconomic History   Marital status: Divorced    Spouse name: Not on file   Number of children: Not on file   Years of education: Not on file   Highest education level: Not on file  Occupational History   Not on file  Tobacco Use   Smoking status: Never   Smokeless tobacco: Never  Vaping Use   Vaping Use: Never used  Substance and Sexual Activity   Alcohol use: Yes    Comment: socially   Drug use: No   Sexual activity: Yes    Birth control/protection: Surgical  Other Topics Concern   Not on file  Social History Narrative   Not on file   Social Determinants of Health   Financial Resource Strain: Not on file  Food Insecurity: Not on file  Transportation Needs: Not on file  Physical Activity: Not on file  Stress: Not on file  Social Connections: Not on file  Intimate Partner Violence: Not on file      Marcial Pacas, M.D. Ph.D.  [facility] [address], [place], [place] [address] Ph: [phone] Fax: [phone]  CC:  Shelby Mattocks, [place] [place],  [place] [address]  [name], [place]

## 2021-03-31 ENCOUNTER — Telehealth: Payer: Self-pay | Admitting: Neurology

## 2021-03-31 ENCOUNTER — Other Ambulatory Visit: Payer: Self-pay | Admitting: *Deleted

## 2021-03-31 ENCOUNTER — Encounter: Payer: Self-pay | Admitting: Neurology

## 2021-03-31 MED ORDER — MODAFINIL 100 MG PO TABS: 100.0000 mg | ORAL_TABLET | Freq: Every day | ORAL | 5 refills | Status: DC

## 2021-03-31 NOTE — Telephone Encounter (Signed)
Meds ordered this encounter  Medications   modafinil (PROVIGIL) 100 MG tablet    Sig: Take 1 tablet (100 mg total) by mouth daily.    Dispense:  30 tablet    Refill:  5

## 2021-03-31 NOTE — Telephone Encounter (Signed)
Pt called, the medication prescribed at last office visit; modafinil (PROVIGIL) 100 MG tablet is out of stock at my primary pharmacy  Can you send one time prescription to  [facility] [address] [place], [place] Phone: [phone]

## 2021-03-31 NOTE — Telephone Encounter (Signed)
Mychart received from patient:  Afternoon Sharyn Lull and Dr Krista Blue!!   My primary pharmacy is out of stock of the above medicine and informed me that it would not be available until beginning of next week. I have found that [facility] at [address] However, since the prescription is new, they are not able to transfer. Can I please get this prescription sent to the [facility] location?  ____________________________________  Modafinil rx voided at [place] location and will be resent to [facility], per patient request.

## 2021-09-02 ENCOUNTER — Encounter: Payer: Self-pay | Admitting: Nurse Practitioner

## 2021-09-07 ENCOUNTER — Other Ambulatory Visit (HOSPITAL_COMMUNITY)
Admission: RE | Admit: 2021-09-07 | Discharge: 2021-09-07 | Disposition: A | Discharge status: Home / Self Care | Payer: No Typology Code available for payment source | Source: Ambulatory Visit | Attending: Nurse Practitioner | Admitting: Nurse Practitioner

## 2021-09-07 ENCOUNTER — Other Ambulatory Visit: Payer: Self-pay

## 2021-09-07 ENCOUNTER — Ambulatory Visit (INDEPENDENT_AMBULATORY_CARE_PROVIDER_SITE_OTHER): Payer: No Typology Code available for payment source | Admitting: Nurse Practitioner

## 2021-09-07 VITALS — BP 124/68 | HR 77 | Temp 98.3°F | Ht 65.5 in | Wt 201.0 lb

## 2021-09-07 DIAGNOSIS — E559 Vitamin D deficiency, unspecified: Secondary | ICD-10-CM | POA: Diagnosis not present

## 2021-09-07 DIAGNOSIS — Z113 Encounter for screening for infections with a predominantly sexual mode of transmission: Secondary | ICD-10-CM | POA: Insufficient documentation

## 2021-09-07 DIAGNOSIS — D509 Iron deficiency anemia, unspecified: Secondary | ICD-10-CM | POA: Diagnosis not present

## 2021-09-07 DIAGNOSIS — E781 Pure hyperglyceridemia: Secondary | ICD-10-CM

## 2021-09-07 NOTE — Progress Notes (Signed)
I,Sarah [name],acting as a Education administrator for [employer], FNP.,have documented all relevant documentation on the behalf of Sarah Brine, FNP,as directed by  Sarah Brine, FNP while in the presence of Sarah [name], [place].  This visit occurred during the SARS-CoV-2 public health emergency.  Safety protocols were in place, including screening questions prior to the visit, additional usage of staff PPE, and extensive cleaning of exam room while observing appropriate contact time as indicated for disinfecting solutions.  Subjective:     Patient ID: Sarah [name] , female    DOB: [birth year] , 41 y.o.   MRN: [id number]   Chief Complaint  Patient presents with   Exposure to STD    HPI  Patient would like to get STI testing. Recently a partner she had in December notified her that he was positive for syphilis.    Exposure to STD  This is a new problem. The vaginal discharge was normal.    Past Medical History:  Diagnosis Date   Headache    Heart murmur    Melanoma ([place]) 1996   on head   Multiple sclerosis ([place])    Multiple sclerosis ([place])      Family History  Problem Relation Age of Onset   Diabetes Mother    Diabetes Maternal Uncle    Hypertension Maternal Grandmother      Current Outpatient Medications:    acetaminophen (TYLENOL) 500 MG tablet, Take 500 mg by mouth every 6 (six) hours as needed., Disp: , Rfl:    ocrelizumab 600 mg in sodium chloride 0.9 % 500 mL, Inject 600 mg every 6 (six) months into the vein., Disp: , Rfl:    Probiotic Product (RESTORA) CAPS, Take 1 capsule by mouth daily., Disp: 30 capsule, Rfl: 3   SUMAtriptan (IMITREX) 100 MG tablet, Take 1 tablet (100 mg total) by mouth once as needed for up to 1 dose for migraine. May repeat in 2 hours if headache persists or recurs., Disp: 30 tablet, Rfl: 1   traZODone (DESYREL) 100 MG tablet, Take 1 tablet (100 mg total) by mouth at bedtime as needed for sleep., Disp: 30 tablet, Rfl: 0   venlafaxine XR (EFFEXOR XR) 75 MG  24 hr capsule, Take 1 capsule (75 mg total) by mouth daily with breakfast., Disp: 30 capsule, Rfl: 11   Vitamin D, Ergocalciferol, (DRISDOL) 1.25 MG (50000 UNIT) CAPS capsule, Take 1 capsule (50,000 Units total) by mouth 2 (two) times a week., Disp: 24 capsule, Rfl: 1   No Known Allergies   Review of Systems  Constitutional: Negative.   Respiratory: Negative.    Cardiovascular: Negative.   Gastrointestinal: Negative.   Neurological: Negative.     Today's Vitals   09/07/21 1519  BP: 124/68  Pulse: 77  Temp: 98.3 F (36.8 C)  TempSrc: Oral  Weight: 201 lb (91.2 kg)  Height: 5' 5.5" (1.664 m)   Body mass index is 32.94 kg/m.   Objective:  Physical Exam Vitals reviewed.  Constitutional:      General: She is not in acute distress.    Appearance: Normal appearance. She is well-developed. She is obese.  Cardiovascular:     Rate and Rhythm: Normal rate and regular rhythm.     Pulses: Normal pulses.     Heart sounds: Normal heart sounds. No murmur heard. Pulmonary:     Effort: Pulmonary effort is normal. No respiratory distress.     Breath sounds: Normal breath sounds.  Chest:     Chest wall: No tenderness.  Musculoskeletal:        General: Normal range of motion.  Skin:    General: Skin is warm and dry.     Capillary Refill: Capillary refill takes less than 2 seconds.  Neurological:     General: No focal deficit present.     Mental Status: She is alert and oriented to person, place, and time.     Cranial Nerves: No cranial nerve deficit.  Psychiatric:        Mood and Affect: Mood normal.        Behavior: Behavior normal.        Thought Content: Thought content normal.        Judgment: Judgment normal.        Assessment And Plan:     1. High triglycerides Comments: Were elevated with her visit last year, No current medications. Will recheck today - Lipid panel  2. Vitamin D deficiency Will check vitamin D level and supplement as needed.    Also encouraged to  spend 15 minutes in the sun daily.  - Vitamin D (25 hydroxy)  3. Iron deficiency anemia, unspecified iron deficiency anemia type Comments: Previous history of iron deficiency, will recheck levels. Encouraged to eat foods high in iron - Iron, TIBC and Ferritin Panel  4. Screening for STD (sexually transmitted disease) Comments: No current symptoms.  - HIV Antibody (routine testing w rflx) - Urine cytology ancillary only - HSV(herpes simplex vrs) 1+2 ab-IgG - RPR     Patient was given opportunity to ask questions. Patient verbalized understanding of the plan and was able to repeat key elements of the plan. All questions were answered to their satisfaction.  Sarah Brine, FNP   I, Sarah Brine, FNP, have reviewed all documentation for this visit. The documentation on 09/07/21 for the exam, diagnosis, procedures, and orders are all accurate and complete.   IF YOU HAVE BEEN REFERRED TO A SPECIALIST, IT MAY TAKE 1-2 WEEKS TO SCHEDULE/PROCESS THE REFERRAL. IF YOU HAVE NOT HEARD FROM US/SPECIALIST IN TWO WEEKS, PLEASE GIVE [place] A CALL AT [phone] X [phone].   THE PATIENT IS ENCOURAGED TO PRACTICE SOCIAL DISTANCING DUE TO THE COVID-19 PANDEMIC.

## 2021-09-08 LAB — LIPID PANEL
Chol/HDL Ratio: 3 ratio (ref 0.0–4.4)
Cholesterol, Total: 149 mg/dL (ref 100–199)
HDL: 50 mg/dL (ref >39)
LDL Chol Calc (NIH): 82 mg/dL (ref 0–99)
Triglycerides: 89 mg/dL (ref 0–149)
VLDL Cholesterol Cal: 17 mg/dL (ref 5–40)

## 2021-09-08 LAB — IRON,TIBC AND FERRITIN PANEL
Ferritin: 27 ng/mL (ref 15–150)
Iron Saturation: 13 % — ABNORMAL LOW (ref 15–55)
Iron: 44 ug/dL (ref 27–159)
Total Iron Binding Capacity: 351 ug/dL (ref 250–450)
UIBC: 307 ug/dL (ref 131–425)

## 2021-09-08 LAB — HSV(HERPES SIMPLEX VRS) I + II AB-IGG
HSV 1 Glycoprotein G Ab, IgG: 51.2 index — ABNORMAL HIGH (ref 0.00–0.90)
HSV 2 IgG, Type Spec: <0.91 index (ref 0.00–0.90)

## 2021-09-08 LAB — RPR: RPR Ser Ql: NONREACTIVE

## 2021-09-08 LAB — VITAMIN D 25 HYDROXY (VIT D DEFICIENCY, FRACTURES): Vit D, 25-Hydroxy: 17.6 ng/mL — ABNORMAL LOW (ref 30.0–100.0)

## 2021-09-08 LAB — HIV ANTIBODY (ROUTINE TESTING W REFLEX): HIV Screen 4th Generation wRfx: NONREACTIVE

## 2021-09-10 ENCOUNTER — Telehealth: Payer: Self-pay | Admitting: Nurse Practitioner

## 2021-09-10 ENCOUNTER — Other Ambulatory Visit: Payer: Self-pay | Admitting: Nurse Practitioner

## 2021-09-10 DIAGNOSIS — E559 Vitamin D deficiency, unspecified: Secondary | ICD-10-CM

## 2021-09-10 LAB — URINE CYTOLOGY ANCILLARY ONLY
Bacterial Vaginitis-Urine: NEGATIVE
Candida Urine: NEGATIVE
Chlamydia: NEGATIVE
Comment: NEGATIVE
Comment: NEGATIVE
Comment: NORMAL
Neisseria Gonorrhea: NEGATIVE
Trichomonas: NEGATIVE

## 2021-09-10 MED ORDER — VITAMIN D (ERGOCALCIFEROL) 1.25 MG (50000 UNIT) PO CAPS: 50000.0000 [IU] | ORAL_CAPSULE | ORAL | 1 refills | Status: AC

## 2021-09-10 NOTE — Telephone Encounter (Signed)
Called to give her results of her labs and verbalized understanding.

## 2021-09-26 ENCOUNTER — Encounter: Payer: Self-pay | Admitting: Nurse Practitioner

## 2021-09-29 NOTE — Progress Notes (Signed)
PATIENT: Sarah [name] DOB: 11/05/1980  REASON FOR VISIT: Follow up for MS HISTORY FROM: Patient PRIMARY NEUROLOGIST: Dr. Krista Blue   HISTORY She had a history of relapsing remitting multiple sclerosis, the diagnosis was made in 2002, she presented with right optic neuritis, initially was treated at [facility] with steroid, later Betaseron, she did very well but she had a stillborn at week 54 while using Betaseron before and during pregnancy, she later had more pregnant first born in December 2004, second born August 2006, third child in April 2009 with twins, Betaseron was on hold during her pregnancy.   She had a major flareup in summer of 2007, she had 4 extremity paresthesia, weakness, difficulty walking, she was again put on steroid and Betaseron transiently, she could not tolerate the injection due to flulike illness, nausea or vomiting, and the cost factor. Betaseron was stopped,   Third major flareup was in January 2011, she had significant weakness, fall and paresthesia, difficulty walking driving for 2 months, Dwyane Dee was discussed with her at that time, but because worried the side effect of PML, it was not started,   She was not on any treatment between 2011 and 2014.   Another flareup was in February 2014, she had right eye pain, blurry vision, right arm and leg weakness, increased gait abnormality, constipation, bladder incontinence, somewhat responsive to steroid,   MRI of the brain in 2015 showed multiple bilateral periventricular and subcortical MS lesions, MRI of cervical spine also demonstrate increased T2 at cervical cord at C2-3, C5, left-sided lesion at C7. MRI of the thoracic spine showed diffuse cord abnormality at T1-T9, focal enhancement at ventral surface of the cord at T5-6, extending cephalocaudal fourth 12 mm,   JC virus antibody was negative with titer of 0.19 in May 2015, she was enrolled in research study was treated with Copaxone in April 2014, later she was rolled  over to [place] since early 2014,    Her gait difficulty has much improved with Gilenya treatment,, but still with baseline bilateral lower extremity spasticity and paresthesia, last office visit was in May 2015, she has developed lower back cellulitis in April 2015, she was treated with antibiotics, she has stopped Gilenya for about 2 weeks, then had another flareup of aggressive worsening gait abnormality, right arm and leg weakness, paresthesia, also progressive worsening bilateral visual difficulty, severely restricted visual field, decreased visual acuity. During last office visit in May 2015, She was found to have severely restricted bilateral visual field, decreased visual acuity, counting fingers with right eye, left eye could only read large print, mild spastic quadriplegia, right worse than left, with motor strength of 4/5, ambulate with a wide based unsteady gait   She was sent to hospital admission in May 2015, her symptoms has much improved with high-dose of IV steroid followed by physical therapy, she has lost follow-ups since. 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 June 2015     She was pregnant since September 2016,  had elective C-section on Dec 20 2015, with healthy girl   She presented to emergency room on March 24 2016 for  worsening bilateral hands feet numbness, unsteady gait, weakness, she started to have worsening paresthesia, bladder urgency, incontinence, increased right eye blurry postpartum 6 week around June 2017, progressively worsened   I have personally reviewed repeat MRI of  the brain, cervical spine with and without contrast in August 2017, in comparison with 2015, new enhancing nations in periventricular and subcortical white matter, splenium of corpus callosum consistent with active demyelinating, new  enhancing cord lesion at C4 in the dorsal.   She was enrolled into MS search since Oct 2017, we have reviewed most recent MRI, in comparison to MRI of the brain with and without contrast in August 2017, and 2015, there was increase supratentorium lesion load, there was also 5 foci enhanced, which was not present in MRI of the brain in August 2017.   MRI of the cervical spine in August 2017 showed new enhancing cord lesion at C4,   She started research medication subcutaneous (CD20 monoclonal antibody) every month plus pill (Abulgia).    She reported significant change at the end of April 2018, she developed double vision, dizziness, ataxic gait, she began IV Solu-Medrol infusion 1000 mg daily since Dec 14 2016, complete 3 days today, about 2 weeks after symptoms onset without significant improvement,   She denies signs of infection, reviewed laboratory evaluation showed hemoglobin 10 point 5, creatinine 0.71, cholesterol 156, triglycerides 193, UA on November 15 2016 was negative     UPDATE March 30 2021: She is overall stable, ocrelizumab was started since fall 2017, she works from home, continue has mild baseline gait abnormality, lower extremity spasticity,  She also complains of increased fatigue, chronic migraine headaches about twice a week, mild depression,   Update September 30, 2021 SS: Labs from July 2022 showed normal IgG, IgM, recent vitamin D level was low 17.6, on supplement. On Ocrevus. MRI brain with and without contrast June 2022 stable, no new lesions since October 2018. Next Ocrevus is March 13th at our office. Chronic fatigue, low energy. Bilateral hip pain, did PT, dry needling, pelvic therapy, helps when in therapy, but makes worse when does at home. Sees psych, taking Effexor XR at night to help with sleep. Chronic insominia, has trazodone PRN. Stopped the Provigil due to dark stools, headaches got worsen, it did help with the fatigue. 4 kids (ages 78-18). Headaches better, 2 a  month, Imitrex does help, often has to take 2. Few falls right leg gives way. Works at [employer].  REVIEW OF SYSTEMS: Out of a complete 14 system review of symptoms, the patient complains only of the following symptoms, and all other reviewed systems are negative.  See HPI  ALLERGIES: No Known Allergies  HOME MEDICATIONS: Outpatient Medications Prior to Visit  Medication Sig Dispense Refill   acetaminophen (TYLENOL) 500 MG tablet Take 500 mg by mouth every 6 (six) hours as needed.     Ferrous Sulfate (IRON PO) Take 225 mg by mouth daily.     LYSINE PO Take 1 tablet by mouth daily.     ocrelizumab 600 mg in sodium chloride 0.9 % 500 mL Inject 600 mg every 6 (six) months into the vein.     Probiotic Product (RESTORA) CAPS Take 1 capsule by mouth daily. 30 capsule 3   traZODone (DESYREL) 100 MG tablet Take 1 tablet (100 mg total) by mouth at bedtime as needed for sleep. 30 tablet 0   Vitamin D, Ergocalciferol, (DRISDOL) 1.25 MG (50000 UNIT) CAPS capsule Take 1 capsule (50,000 Units total) by mouth 2 (two) times a week. 24 capsule 1   SUMAtriptan (IMITREX) 100 MG tablet Take 1 tablet (100 mg total) by mouth once as needed for up to 1 dose for migraine. May repeat in 2 hours  if headache persists or recurs. 30 tablet 1   venlafaxine XR (EFFEXOR XR) 75 MG 24 hr capsule Take 1 capsule (75 mg total) by mouth daily with breakfast. 30 capsule 11   No facility-administered medications prior to visit.    PAST MEDICAL HISTORY: Past Medical History:  Diagnosis Date   Headache    Heart murmur    Melanoma ([place]) 1996   on head   Multiple sclerosis ([place])    Multiple sclerosis ([place])     PAST SURGICAL HISTORY: Past Surgical History:  Procedure Laterality Date   CESAREAN SECTION     x4   CESAREAN SECTION WITH BILATERAL TUBAL LIGATION N/A 12/20/2015   Procedure: CESAREAN SECTION WITH BILATERAL TUBAL LIGATION;  Surgeon: Lavonia Drafts, MD;  Location: [facility];  Service:  Obstetrics;  Laterality: N/A;   MELANOMA EXCISION  age 66   on head   TUBAL LIGATION  2017    FAMILY HISTORY: Family History  Problem Relation Age of Onset   Diabetes Mother    Diabetes Maternal Uncle    Hypertension Maternal Grandmother     SOCIAL HISTORY: Social History   Socioeconomic History   Marital status: Divorced    Spouse name: Not on file   Number of children: Not on file   Years of education: Not on file   Highest education level: Not on file  Occupational History   Not on file  Tobacco Use   Smoking status: Never   Smokeless tobacco: Never  Vaping Use   Vaping Use: Never used  Substance and Sexual Activity   Alcohol use: Yes    Comment: socially   Drug use: No   Sexual activity: Yes    Birth control/protection: Surgical  Other Topics Concern   Not on file  Social History Narrative   Not on file   Social Determinants of Health   Financial Resource Strain: Not on file  Food Insecurity: Not on file  Transportation Needs: Not on file  Physical Activity: Not on file  Stress: Not on file  Social Connections: Not on file  Intimate Partner Violence: Not on file      PHYSICAL EXAM  Vitals:   09/30/21 0943  BP: 121/75  Pulse: 74  Weight: 210 lb (95.3 kg)  Height: 5' 5.5" (1.664 m)   Body mass index is 34.41 kg/m.  Generalized: Well developed, in no acute distress   Neurological examination  Mentation: Alert oriented to time, place, history taking. Follows all commands speech and language fluent Cranial nerve II-XII: Pupils were equal round reactive to light. Extraocular movements were full, visual field were full on confrontational test. Facial sensation and strength were normal. Head turning and shoulder shrug  were normal and symmetric. Motor: 4/5 bilateral hip flexion, pain to the right hip  Sensory: Sensory testing is intact to soft touch on all 4 extremities. No evidence of extinction is noted.  Coordination: Cerebellar testing reveals  good finger-nose-finger and heel-to-shin bilaterally.  Gait and station: Limp on the right, mildly unsteady Reflexes: Deep tendon reflexes are symmetric and normal bilaterally.   DIAGNOSTIC DATA (LABS, IMAGING, TESTING) - I reviewed patient records, labs, notes, testing and imaging myself where available.  Lab Results  Component Value Date   WBC 7.2 03/03/2021   HGB 11.0 (L) 03/03/2021   HCT 33.1 (L) 03/03/2021   MCV 89 03/03/2021   PLT 329 03/03/2021      Component Value Date/Time   NA 140 03/03/2021 0000   K 4.0  03/03/2021 0000   CL 102 03/03/2021 0000   CO2 26 03/03/2021 0000   GLUCOSE 80 03/03/2021 0000   GLUCOSE 95 03/24/2016 1840   BUN 8 03/03/2021 0000   CREATININE 0.68 03/03/2021 0000   CALCIUM 9.1 03/03/2021 0000   PROT 7.0 03/03/2021 0000   ALBUMIN 4.3 03/03/2021 0000   AST 9 03/03/2021 0000   ALT 7 03/03/2021 0000   ALKPHOS 84 03/03/2021 0000   BILITOT <0.2 03/03/2021 0000   GFRNONAA 88 11/22/2019 1541   GFRAA 101 11/22/2019 1541   Lab Results  Component Value Date   CHOL 149 09/07/2021   HDL 50 09/07/2021   LDLCALC 82 09/07/2021   TRIG 89 09/07/2021   CHOLHDL 3.0 09/07/2021   Lab Results  Component Value Date   HGBA1C 5.5 10/26/2018   Lab Results  Component Value Date   VITAMINB12 415 03/31/2016   Lab Results  Component Value Date   TSH 1.110 10/11/2018    ASSESSMENT AND PLAN 41 y.o. year old female   1.  Relapsing remitting multiple sclerosis -Stable, continue Ocrevus, next infusion is in March, check routine labs  -On Ocrevus since 2017 -MRI of the brain June 2022 was overall stable, no new lesions  2.  Depression -Seeing psychiatry, Effexor dual benefit for migraines/mood; mood is doing better, had bad spell of depression   3.  Fatigue -Side effect with Provigil, will try armodafinil 50 mg daily   4.  Chronic migraine headache -Much improved, 2 a month now -Continue Effexor XR 75 mg daily  -Continue Imitrex 100 mg PRN   5.  Bilateral hip pain, R > L -referral to orthopedics, she works at [employer], requested referral sent there -Has tried PT, dry needling in the past  6. Vitamin D Deficiency -Low 17.6 in Jan 2023, started prescription dosing from PCP  Butler Denmark, AGNP-C, DNP 09/30/2021, 10:15 AM [facility] [address] [place], [place] [address] [phone]

## 2021-09-30 ENCOUNTER — Telehealth: Payer: Self-pay | Admitting: Neurology

## 2021-09-30 ENCOUNTER — Ambulatory Visit (INDEPENDENT_AMBULATORY_CARE_PROVIDER_SITE_OTHER): Payer: No Typology Code available for payment source | Admitting: Neurology

## 2021-09-30 ENCOUNTER — Encounter: Payer: Self-pay | Admitting: Neurology

## 2021-09-30 VITALS — BP 121/75 | HR 74 | Ht 65.5 in | Wt 210.0 lb

## 2021-09-30 DIAGNOSIS — G35 Multiple sclerosis: Secondary | ICD-10-CM

## 2021-09-30 DIAGNOSIS — F3289 Other specified depressive episodes: Secondary | ICD-10-CM

## 2021-09-30 DIAGNOSIS — G43709 Chronic migraine without aura, not intractable, without status migrainosus: Secondary | ICD-10-CM | POA: Diagnosis not present

## 2021-09-30 DIAGNOSIS — R5383 Other fatigue: Secondary | ICD-10-CM | POA: Diagnosis not present

## 2021-09-30 MED ORDER — SUMATRIPTAN SUCCINATE 100 MG PO TABS: 100.0000 mg | ORAL_TABLET | Freq: Once | ORAL | 1 refills | Status: DC | PRN

## 2021-09-30 MED ORDER — ARMODAFINIL 50 MG PO TABS: 50.0000 mg | ORAL_TABLET | Freq: Every day | ORAL | 3 refills | Status: DC

## 2021-09-30 MED ORDER — VENLAFAXINE HCL ER 75 MG PO CP24: 75.0000 mg | ORAL_CAPSULE | Freq: Every day | ORAL | 3 refills | Status: DC

## 2021-09-30 NOTE — Patient Instructions (Addendum)
Referral to orthopedics  Check labs  Continue Ocrevus Try armodafinil 50 mg in the morning for fatigue See you back in 6 months  Meds ordered this encounter  Medications   Armodafinil 50 MG tablet    Sig: Take 1 tablet (50 mg total) by mouth daily.    Dispense:  30 tablet    Refill:  3   venlafaxine XR (EFFEXOR XR) 75 MG 24 hr capsule    Sig: Take 1 capsule (75 mg total) by mouth daily with breakfast.    Dispense:  90 capsule    Refill:  3   SUMAtriptan (IMITREX) 100 MG tablet    Sig: Take 1 tablet (100 mg total) by mouth once as needed for up to 1 dose for migraine. May repeat in 2 hours if headache persists or recurs.    Dispense:  30 tablet    Refill:  1

## 2021-09-30 NOTE — Telephone Encounter (Signed)
Referral sent to Dr. Delilah Shan with [facility] [phone].

## 2021-10-01 LAB — COMPREHENSIVE METABOLIC PANEL
ALT: 14 IU/L (ref 0–32)
AST: 14 IU/L (ref 0–40)
Albumin/Globulin Ratio: 1.7 (ref 1.2–2.2)
Albumin: 4.3 g/dL (ref 3.8–4.8)
Alkaline Phosphatase: 85 IU/L (ref 44–121)
BUN/Creatinine Ratio: 10 (ref 9–23)
BUN: 8 mg/dL (ref 6–20)
Bilirubin Total: <0.2 mg/dL (ref 0.0–1.2)
CO2: 25 mmol/L (ref 20–29)
Calcium: 9.4 mg/dL (ref 8.7–10.2)
Chloride: 106 mmol/L (ref 96–106)
Creatinine, Ser: 0.77 mg/dL (ref 0.57–1.00)
Globulin, Total: 2.6 g/dL (ref 1.5–4.5)
Glucose: 92 mg/dL (ref 70–99)
Potassium: 4.8 mmol/L (ref 3.5–5.2)
Sodium: 144 mmol/L (ref 134–144)
Total Protein: 6.9 g/dL (ref 6.0–8.5)
eGFR: 101 mL/min/{1.73_m2} (ref >59)

## 2021-10-01 LAB — CBC WITH DIFFERENTIAL/PLATELET
Basophils Absolute: 0.1 10*3/uL (ref 0.0–0.2)
Basos: 1 %
EOS (ABSOLUTE): 0.5 10*3/uL — ABNORMAL HIGH (ref 0.0–0.4)
Eos: 9 %
Hematocrit: 33.6 % — ABNORMAL LOW (ref 34.0–46.6)
Hemoglobin: 10.8 g/dL — ABNORMAL LOW (ref 11.1–15.9)
Immature Grans (Abs): 0 10*3/uL (ref 0.0–0.1)
Immature Granulocytes: 0 %
Lymphocytes Absolute: 2.1 10*3/uL (ref 0.7–3.1)
Lymphs: 34 %
MCH: 29.1 pg (ref 26.6–33.0)
MCHC: 32.1 g/dL (ref 31.5–35.7)
MCV: 91 fL (ref 79–97)
Monocytes Absolute: 0.8 10*3/uL (ref 0.1–0.9)
Monocytes: 13 %
Neutrophils Absolute: 2.7 10*3/uL (ref 1.4–7.0)
Neutrophils: 43 %
Platelets: 360 10*3/uL (ref 150–450)
RBC: 3.71 x10E6/uL — ABNORMAL LOW (ref 3.77–5.28)
RDW: 12.8 % (ref 11.7–15.4)
WBC: 6.2 10*3/uL (ref 3.4–10.8)

## 2021-10-01 LAB — IGG, IGA, IGM
IgA/Immunoglobulin A, Serum: 410 mg/dL — ABNORMAL HIGH (ref 87–352)
IgG (Immunoglobin G), Serum: 1014 mg/dL (ref 586–1602)
IgM (Immunoglobulin M), Srm: 45 mg/dL (ref 26–217)

## 2021-11-03 ENCOUNTER — Telehealth: Payer: Self-pay | Admitting: Neurology

## 2021-11-03 NOTE — Telephone Encounter (Addendum)
Received MRI left hip report from [facility], showed mild diffuse hip chondral thinning, anterosuperior to superolateral labral degeneration with superolateral undersurface partial tear, moderate pubic symphysis degeneration, mild gluteus minimus insertional tendinosis with minimal greater trochanteric bursal edema.  ? ?Right hip showed mild hip chondral thinning without large full-thickness chondral defect.  Anterosuperior to superolateral labrum undersurface partial tearing, mild to moderate greater trochanteric bursitis ? ?Saw Dr. Delilah Shan at [facility], will defer to their judgement about treatment/management for MRI findings of the hip.  ? ?I tried to call the patient, mail box was full. I sent her a my chart message. ?

## 2021-12-09 ENCOUNTER — Ambulatory Visit (INDEPENDENT_AMBULATORY_CARE_PROVIDER_SITE_OTHER): Payer: No Typology Code available for payment source | Admitting: Nurse Practitioner

## 2021-12-09 ENCOUNTER — Encounter: Payer: Self-pay | Admitting: Nurse Practitioner

## 2021-12-09 VITALS — BP 126/68 | HR 71 | Temp 97.9°F | Ht 65.5 in | Wt 207.0 lb

## 2021-12-09 DIAGNOSIS — G35 Multiple sclerosis: Secondary | ICD-10-CM

## 2021-12-09 DIAGNOSIS — E781 Pure hyperglyceridemia: Secondary | ICD-10-CM

## 2021-12-09 DIAGNOSIS — E559 Vitamin D deficiency, unspecified: Secondary | ICD-10-CM | POA: Diagnosis not present

## 2021-12-09 DIAGNOSIS — Z0000 Encounter for general adult medical examination without abnormal findings: Secondary | ICD-10-CM

## 2021-12-09 DIAGNOSIS — E6609 Other obesity due to excess calories: Secondary | ICD-10-CM

## 2021-12-09 DIAGNOSIS — Z6833 Body mass index (BMI) 33.0-33.9, adult: Secondary | ICD-10-CM

## 2021-12-09 DIAGNOSIS — D509 Iron deficiency anemia, unspecified: Secondary | ICD-10-CM

## 2021-12-09 DIAGNOSIS — Z79899 Other long term (current) drug therapy: Secondary | ICD-10-CM

## 2021-12-09 DIAGNOSIS — N939 Abnormal uterine and vaginal bleeding, unspecified: Secondary | ICD-10-CM

## 2021-12-09 DIAGNOSIS — F3289 Other specified depressive episodes: Secondary | ICD-10-CM

## 2021-12-09 NOTE — Patient Instructions (Addendum)
Health Maintenance, Female ?Adopting a healthy lifestyle and getting preventive care are important in promoting health and wellness. Ask your health care provider about: ?The right schedule for you to have regular tests and exams. ?Things you can do on your own to prevent diseases and keep yourself healthy. ?What should I know about diet, weight, and exercise? ?Eat a healthy diet ? ?Eat a diet that includes plenty of vegetables, fruits, low-fat dairy products, and lean protein. ?Do not eat a lot of foods that are high in solid fats, added sugars, or sodium. ?Maintain a healthy weight ?Body mass index (BMI) is used to identify weight problems. It estimates body fat based on height and weight. Your health care provider can help determine your BMI and help you achieve or maintain a healthy weight. ?Get regular exercise ?Get regular exercise. This is one of the most important things you can do for your health. Most adults should: ?Exercise for at least 150 minutes each week. The exercise should increase your heart rate and make you sweat (moderate-intensity exercise). ?Do strengthening exercises at least twice a week. This is in addition to the moderate-intensity exercise. ?Spend less time sitting. Even light physical activity can be beneficial. ?Watch cholesterol and blood lipids ?Have your blood tested for lipids and cholesterol at 40 years of age, then have this test every 5 years. ?Have your cholesterol levels checked more often if: ?Your lipid or cholesterol levels are high. ?You are older than 40 years of age. ?You are at high risk for heart disease. ?What should I know about cancer screening? ?Depending on your health history and family history, you may need to have cancer screening at various ages. This may include screening for: ?Breast cancer. ?Cervical cancer. ?Colorectal cancer. ?Skin cancer. ?Lung cancer. ?What should I know about heart disease, diabetes, and high blood pressure? ?Blood pressure and heart  disease ?High blood pressure causes heart disease and increases the risk of stroke. This is more likely to develop in people who have high blood pressure readings or are overweight. ?Have your blood pressure checked: ?Every 3-5 years if you are 19-40 years of age. ?Every year if you are 40 years old or older. ?Diabetes ?Have regular diabetes screenings. This checks your fasting blood sugar level. Have the screening done: ?Once every three years after age 18 if you are at a normal weight and have a low risk for diabetes. ?More often and at a younger age if you are overweight or have a high risk for diabetes. ?What should I know about preventing infection? ?Hepatitis B ?If you have a higher risk for hepatitis B, you should be screened for this virus. Talk with your health care provider to find out if you are at risk for hepatitis B infection. ?Hepatitis C ?Testing is recommended for: ?Everyone born from [birth year] through 1964. ?Anyone with known risk factors for hepatitis C. ?Sexually transmitted infections (STIs) ?Get screened for STIs, including gonorrhea and chlamydia, if: ?You are sexually active and are younger than 40 years of age. ?You are older than 40 years of age and your health care provider tells you that you are at risk for this type of infection. ?Your sexual activity has changed since you were last screened, and you are at increased risk for chlamydia or gonorrhea. Ask your health care provider if you are at risk. ?Ask your health care provider about whether you are at high risk for HIV. Your health care provider may recommend a prescription medicine to help prevent HIV  infection. If you choose to take medicine to prevent HIV, you should first get tested for HIV. You should then be tested every 3 months for as long as you are taking the medicine. ?Pregnancy ?If you are about to stop having your period (premenopausal) and you may become pregnant, seek counseling before you get pregnant. ?Take 400 to 800  micrograms (mcg) of folic acid every day if you become pregnant. ?Ask for birth control (contraception) if you want to prevent pregnancy. ?Osteoporosis and menopause ?Osteoporosis is a disease in which the bones lose minerals and strength with aging. This can result in bone fractures. If you are 40 years old or older, or if you are at risk for osteoporosis and fractures, ask your health care provider if you should: ?Be screened for bone loss. ?Take a calcium or vitamin D supplement to lower your risk of fractures. ?Be given hormone replacement therapy (HRT) to treat symptoms of menopause. ?Follow these instructions at home: ?Alcohol use ?Do not drink alcohol if: ?Your health care provider tells you not to drink. ?You are pregnant, may be pregnant, or are planning to become pregnant. ?If you drink alcohol: ?Limit how much you have to: ?0-1 drink a day. ?Know how much alcohol is in your drink. In the [place] one drink equals one 12 oz bottle of beer (355 mL), one 5 oz glass of wine (148 mL), or one 1? oz glass of hard liquor (44 mL). ?Lifestyle ?Do not use any products that contain nicotine or tobacco. These products include cigarettes, chewing tobacco, and vaping devices, such as e-cigarettes. If you need help quitting, ask your health care provider. ?Do not use street drugs. ?Do not share needles. ?Ask your health care provider for help if you need support or information about quitting drugs. ?General instructions ?Schedule regular health, dental, and eye exams. ?Stay current with your vaccines. ?Tell your health care provider if: ?You often feel depressed. ?You have ever been abused or do not feel safe at home. ?Summary ?Adopting a healthy lifestyle and getting preventive care are important in promoting health and wellness. ?Follow your health care provider's instructions about healthy diet, exercising, and getting tested or screened for diseases. ?Follow your health care provider's instructions on monitoring your  cholesterol and blood pressure. ?This information is not intended to replace advice given to you by your health care provider. Make sure you discuss any questions you have with your health care provider. ?Document Revised: 12/15/2020 Document Reviewed: 12/15/2020 ?Elsevier Patient Education ? [place]. ? ? ?[email or web address] ? ?[place] [phone] ?

## 2021-12-09 NOTE — Progress Notes (Signed)
I,Sarah [name],acting as a Education administrator for [employer], FNP.,have documented all relevant documentation on the behalf of Sarah Brine, FNP,as directed by  Sarah Brine, FNP while in the presence of Sarah [name], [place]. ?  ?This visit occurred during the SARS-CoV-2 public health emergency.  Safety protocols were in place, including screening questions prior to the visit, additional usage of staff PPE, and extensive cleaning of exam room while observing appropriate contact time as indicated for disinfecting solutions. ? ?Subjective:  ?  ? Patient ID: Sarah [name] , female    DOB: Nov 15, 1980 , 41 y.o.   MRN: [id number] ? ? ?Chief Complaint  ?Patient presents with  ? Annual Exam  ? ? ?HPI ? ?Sarah [name] presents for an annual wellness exam.  She is being followed by neurology for MS.  She has a history of heart murmur, iron deficiency anemia, urinary incontinence, headaches, and melanoma of the head.  She did PT pelvic therapy. Neurology sent her to orthopedic has bursitis, tendinitis and tears in both hips. She has done conservative therapy and she is to discuss surgery. Her incontinence is related to her MS and right now no medications. ? ?LMP - last 29 days has had a menstrual cycle 22 days. Currently on moderate like day 2. She is on lysine which she added in January.   ?  ? ?Past Medical History:  ?Diagnosis Date  ? Headache   ? Heart murmur   ? History of twin pregnancy in prior pregnancy 09/09/2015  ? Last C/S.  Done due to IUGR of female twin; she died at 55 months old  ? Late prenatal care affecting pregnancy in second trimester, antepartum 09/09/2015  ? Melanoma ([place]) 1996  ? on head  ? Multiple sclerosis ([place])   ? Multiple sclerosis ([place])   ? Multiple sclerosis affecting pregnancy ([place]) 08/22/2015  ? Followed by Dr. Ronny Flurry at [facility].  Stopped meds when found out she was pregnant. MFM consult/Dr. Nelda Severe: "Although Ms. Vanderpol does have bladder incontinence which is a common symptoms of her MS, she thinks this  is more related to her pregnancy rather than MS as she is not having any of her other MS symptoms at this time.  I advised Ms. Nazaire to make an appointment to see her Neurologist Dr.   ? Previous cesarean delivery, antepartum condition or complication 02/06/1600  ? Prev C/S x 3  ? Prior pregnancy with fetal demise 08/22/2015  ? @ 22 weeks.  ? Status post repeat low transverse cesarean section 12/20/2015  ? Supervision of high-risk pregnancy 08/22/2015  ?  Clinic  High Risk - Transfer from [facility] Prenatal Labs  Dating  LMP Blood type: O/Positive/-- (10/14 0000) O pos  Genetic Screen 1? Screen:  neg   AFP:     neg Antibody:Negative (10/14 0000)neg  Anatomic [place] Normal Rubella: Immune (10/14 0000)Immune  GTT  Third trimester: 129 RPR: NON REAC (02/23 0850) NR  Flu vaccine declines HBsAg: Negative (10/14 0000) Neg  TDaP vaccine 10/02/15     ?  ? ?Family History  ?Problem Relation Age of Onset  ? Diabetes Mother   ? Diabetes Maternal Uncle   ? Hypertension Maternal Grandmother   ? ? ? ?Current Outpatient Medications:  ?  acetaminophen (TYLENOL) 500 MG tablet, Take 500 mg by mouth every 6 (six) hours as needed., Disp: , Rfl:  ?  Armodafinil 50 MG tablet, Take 1 tablet (50 mg total) by mouth daily., Disp: 30 tablet, Rfl: 3 ?  Ferrous Sulfate (IRON PO), Take 225 mg by mouth daily., Disp: , Rfl:  ?  LYSINE PO, Take 1 tablet by mouth daily., Disp: , Rfl:  ?  ocrelizumab 600 mg in sodium chloride 0.9 % 500 mL, Inject 600 mg every 6 (six) months into the vein., Disp: , Rfl:  ?  Probiotic Product (RESTORA) CAPS, Take 1 capsule by mouth daily., Disp: 30 capsule, Rfl: 3 ?  SUMAtriptan (IMITREX) 100 MG tablet, Take 1 tablet (100 mg total) by mouth once as needed for up to 1 dose for migraine. May repeat in 2 hours if headache persists or recurs., Disp: 30 tablet, Rfl: 1 ?  traZODone (DESYREL) 100 MG tablet, Take 1 tablet (100 mg total) by mouth at bedtime as needed for sleep., Disp: 30 tablet, Rfl: 0 ?  venlafaxine XR  (EFFEXOR XR) 75 MG 24 hr capsule, Take 1 capsule (75 mg total) by mouth daily with breakfast., Disp: 90 capsule, Rfl: 3 ?  Vitamin D, Ergocalciferol, (DRISDOL) 1.25 MG (50000 UNIT) CAPS capsule, Take 1 capsule (50,000 Units total) by mouth 2 (two) times a week., Disp: 24 capsule, Rfl: 1  ? ?No Known Allergies  ? ? ?The patient states she has a  tubal ligation for birth control. Patient's last menstrual period was 11/24/2021.. Negative for Dysmenorrhea and Positive for Menorrhagia. Negative for: breast discharge, breast lump(s), breast pain and breast self exam. Associated symptoms include abnormal vaginal bleeding. Pertinent negatives include abnormal bleeding (hematology), anxiety, decreased libido, depression, difficulty falling sleep, dyspareunia, history of infertility, nocturia, sexual dysfunction, sleep disturbances, urinary incontinence, urinary urgency, vaginal discharge and vaginal itching. Diet regular eats when she can mostly eats snacks. The patient states her exercise level is minimal. She works from home does not get much exercise. She has a 20 week old she has custody of.   ? ?The patient's tobacco use is:  ?Social History  ? ?Tobacco Use  ?Smoking Status Never  ?Smokeless Tobacco Never  ? ?She has been exposed to passive smoke. The patient's alcohol use is:  ?Social History  ? ?Substance and Sexual Activity  ?Alcohol Use Yes  ? Comment: socially  ? ?Additional information: Last pap 11/14/2020, next one scheduled for 11/15/2023.   ? ?Review of Systems  ?Constitutional: Negative.   ?HENT: Negative.    ?Eyes: Negative.   ?Respiratory: Negative.    ?Cardiovascular: Negative.   ?Gastrointestinal: Negative.   ?Endocrine: Negative.   ?Genitourinary:  Positive for menstrual problem.  ?Musculoskeletal: Negative.   ?Skin: Negative.   ?Allergic/Immunologic: Negative.   ?Neurological: Negative.  Negative for dizziness.  ?Hematological: Negative.   ?Psychiatric/Behavioral: Negative.     ? ?Today's Vitals  ? 12/09/21  0859  ?BP: 126/68  ?Pulse: 71  ?Temp: 97.9 ?F (36.6 ?C)  ?TempSrc: Oral  ?Weight: 207 lb (93.9 kg)  ?Height: 5' 5.5" (1.664 m)  ? ?Body mass index is 33.92 kg/m?.  ? ?Objective:  ?Physical Exam ?Constitutional:   ?   General: She is not in acute distress. ?   Appearance: Normal appearance. She is well-developed. She is obese.  ?HENT:  ?   Head: Normocephalic and atraumatic.  ?   Right Ear: Hearing, tympanic membrane, ear canal and external ear normal. There is no impacted cerumen.  ?   Left Ear: Hearing, tympanic membrane, ear canal and external ear normal. There is no impacted cerumen.  ?   Nose: Nose normal.  ?   Mouth/Throat:  ?   Mouth: Mucous membranes are moist.  ?Eyes:  ?  General: Lids are normal.  ?   Extraocular Movements: Extraocular movements intact.  ?   Conjunctiva/sclera: Conjunctivae normal.  ?   Pupils: Pupils are equal, round, and reactive to light.  ?   Funduscopic exam: ?   Right eye: No papilledema.     ?   Left eye: No papilledema.  ?Neck:  ?   Thyroid: No thyroid mass.  ?   Vascular: No carotid bruit.  ?Cardiovascular:  ?   Rate and Rhythm: Normal rate and regular rhythm.  ?   Pulses: Normal pulses.  ?   Heart sounds: Normal heart sounds. No murmur heard. ?Pulmonary:  ?   Effort: Pulmonary effort is normal. No respiratory distress.  ?   Breath sounds: Normal breath sounds. No wheezing.  ?Chest:  ?   Chest wall: No mass.  ?Breasts: ?   Tanner Score is 5.  ?   Right: Normal. No mass or tenderness.  ?   Left: Normal. No mass or tenderness.  ?Abdominal:  ?   General: Abdomen is flat. Bowel sounds are decreased. There is no distension.  ?   Palpations: Abdomen is soft.  ?   Tenderness: There is no abdominal tenderness.  ?Genitourinary: ?   Comments: Deferred - referral to GYN  ?Musculoskeletal:     ?   General: No swelling or tenderness. Normal range of motion.  ?   Cervical back: Full passive range of motion without pain, normal range of motion and neck supple. No tenderness.  ?   Right lower  leg: No edema.  ?   Left lower leg: No edema.  ?Lymphadenopathy:  ?   Upper Body:  ?   Right upper body: No supraclavicular, axillary or pectoral adenopathy.  ?   Left upper body: No supraclavicular, axillary o

## 2021-12-09 NOTE — Progress Notes (Signed)
?Industrial/product designer as a Education administrator for [employer], FNP.,have documented all relevant documentation on the behalf of [name], FNP,as directed by  [name], FNP while in the presence of [name], [place]. ? ?This visit occurred during the SARS-CoV-2 public health emergency.  Safety protocols were in place, including screening questions prior to the visit, additional usage of staff PPE, and extensive cleaning of exam room while observing appropriate contact time as indicated for disinfecting solutions. ? ?Subjective:  ?  ? Patient ID: Sarah [name] , female    DOB: May 28, 1981 , 41 y.o.   MRN: [id number] ? ? ?Chief Complaint  ?Patient presents with  ? Annual Exam  ? ? ?HPI ? ?Sarah [name] presents for an annual wellness exam.  She is being followed by neurology for MS.  She has a history of heart murmur, iron deficiency anemia, urinary incontinence, headaches, and melanoma of the head.   ?  ? ?Past Medical History:  ?Diagnosis Date  ? Headache   ? Heart murmur   ? Melanoma ([place]) 1996  ? on head  ? Multiple sclerosis ([place])   ? Multiple sclerosis ([place])   ?  ? ?Family History  ?Problem Relation Age of Onset  ? Diabetes Mother   ? Diabetes Maternal Uncle   ? Hypertension Maternal Grandmother   ? ? ? ?Current Outpatient Medications:  ?  acetaminophen (TYLENOL) 500 MG tablet, Take 500 mg by mouth every 6 (six) hours as needed., Disp: , Rfl:  ?  Armodafinil 50 MG tablet, Take 1 tablet (50 mg total) by mouth daily., Disp: 30 tablet, Rfl: 3 ?  Ferrous Sulfate (IRON PO), Take 225 mg by mouth daily., Disp: , Rfl:  ?  LYSINE PO, Take 1 tablet by mouth daily., Disp: , Rfl:  ?  ocrelizumab 600 mg in sodium chloride 0.9 % 500 mL, Inject 600 mg every 6 (six) months into the vein., Disp: , Rfl:  ?  Probiotic Product (RESTORA) CAPS, Take 1 capsule by mouth daily., Disp: 30 capsule, Rfl: 3 ?  SUMAtriptan (IMITREX) 100 MG tablet, Take 1 tablet (100 mg total) by mouth once as needed for up to 1 dose for migraine. May repeat in  2 hours if headache persists or recurs., Disp: 30 tablet, Rfl: 1 ?  traZODone (DESYREL) 100 MG tablet, Take 1 tablet (100 mg total) by mouth at bedtime as needed for sleep., Disp: 30 tablet, Rfl: 0 ?  venlafaxine XR (EFFEXOR XR) 75 MG 24 hr capsule, Take 1 capsule (75 mg total) by mouth daily with breakfast., Disp: 90 capsule, Rfl: 3 ?  Vitamin D, Ergocalciferol, (DRISDOL) 1.25 MG (50000 UNIT) CAPS capsule, Take 1 capsule (50,000 Units total) by mouth 2 (two) times a week., Disp: 24 capsule, Rfl: 1  ? ?No Known Allergies  ? ?Review of Systems  ?Constitutional: Negative.   ?HENT: Negative.    ?Eyes: Negative.   ?Respiratory: Negative.    ?Cardiovascular: Negative.   ?Gastrointestinal: Negative.   ?Endocrine: Negative.   ?Genitourinary: Negative.   ?Musculoskeletal: Negative.   ?Skin: Negative.   ?Allergic/Immunologic: Negative.   ?Neurological: Negative.   ?Hematological: Negative.   ?Psychiatric/Behavioral: Negative.     ? ?Today's Vitals  ? 12/09/21 0859  ?BP: 126/68  ?Pulse: 71  ?Temp: 97.9 ?F (36.6 ?C)  ?TempSrc: Oral  ?Weight: 207 lb (93.9 kg)  ?Height: 5' 5.5" (1.664 m)  ? ?Body mass index is 33.92 kg/m?.  ?Wt Readings from Last 3 Encounters:  ?12/09/21 207 lb (93.9 kg)  ?  09/30/21 210 lb (95.3 kg)  ?09/07/21 201 lb (91.2 kg)  ? ? ?Objective:  ?Physical Exam  ? ?   ?Assessment And Plan:  ?   ?1. Encounter for general adult medical examination w/o abnormal findings ? ?2. Vitamin D deficiency ? ?3. High triglycerides ? ?4. Multiple sclerosis ([place]) ? ?5. Iron deficiency anemia, unspecified iron deficiency anemia type ? ?6. Other depression ? ?7. Class 1 obesity due to excess calories without serious comorbidity with body mass index (BMI) of 33.0 to 33.9 in adult ? She is encouraged to strive for BMI less than 30 to decrease cardiac risk. Advised to aim for at least 150 minutes of exercise per week. ? ? ? ?Patient was given opportunity to ask questions. Patient verbalized understanding of the plan and was able to  repeat key elements of the plan. All questions were answered to their satisfaction.  ?[name], FNP  ? ?I, [name], FNP, have reviewed all documentation for this visit. The documentation on 12/09/21 for the exam, diagnosis, procedures, and orders are all accurate and complete.  ? ?IF YOU HAVE BEEN REFERRED TO A SPECIALIST, IT MAY TAKE 1-2 WEEKS TO SCHEDULE/PROCESS THE REFERRAL. IF YOU HAVE NOT HEARD FROM US/SPECIALIST IN TWO WEEKS, PLEASE GIVE [place] A CALL AT [phone].  ? ?THE PATIENT IS ENCOURAGED TO PRACTICE SOCIAL DISTANCING DUE TO THE COVID-19 PANDEMIC.   ?

## 2021-12-10 LAB — CBC
Hematocrit: 32 % — ABNORMAL LOW (ref 34.0–46.6)
Hemoglobin: 10.5 g/dL — ABNORMAL LOW (ref 11.1–15.9)
MCH: 29.2 pg (ref 26.6–33.0)
MCHC: 32.8 g/dL (ref 31.5–35.7)
MCV: 89 fL (ref 79–97)
Platelets: 364 10*3/uL (ref 150–450)
RBC: 3.59 x10E6/uL — ABNORMAL LOW (ref 3.77–5.28)
RDW: 13.3 % (ref 11.7–15.4)
WBC: 6 10*3/uL (ref 3.4–10.8)

## 2021-12-10 LAB — LIPID PANEL
Chol/HDL Ratio: 2.7 ratio (ref 0.0–4.4)
Cholesterol, Total: 160 mg/dL (ref 100–199)
HDL: 60 mg/dL (ref >39)
LDL Chol Calc (NIH): 86 mg/dL (ref 0–99)
Triglycerides: 73 mg/dL (ref 0–149)
VLDL Cholesterol Cal: 14 mg/dL (ref 5–40)

## 2021-12-10 LAB — CMP14+EGFR
ALT: 8 IU/L (ref 0–32)
AST: 9 IU/L (ref 0–40)
Albumin/Globulin Ratio: 1.7 (ref 1.2–2.2)
Albumin: 4.3 g/dL (ref 3.8–4.8)
Alkaline Phosphatase: 92 IU/L (ref 44–121)
BUN/Creatinine Ratio: 13 (ref 9–23)
BUN: 10 mg/dL (ref 6–20)
Bilirubin Total: 0.2 mg/dL (ref 0.0–1.2)
CO2: 23 mmol/L (ref 20–29)
Calcium: 9.2 mg/dL (ref 8.7–10.2)
Chloride: 101 mmol/L (ref 96–106)
Creatinine, Ser: 0.76 mg/dL (ref 0.57–1.00)
Globulin, Total: 2.5 g/dL (ref 1.5–4.5)
Glucose: 84 mg/dL (ref 70–99)
Potassium: 4.3 mmol/L (ref 3.5–5.2)
Sodium: 139 mmol/L (ref 134–144)
Total Protein: 6.8 g/dL (ref 6.0–8.5)
eGFR: 102 mL/min/{1.73_m2} (ref >59)

## 2021-12-10 LAB — TSH: TSH: 1.19 u[IU]/mL (ref 0.450–4.500)

## 2021-12-10 LAB — IRON,TIBC AND FERRITIN PANEL
Ferritin: 15 ng/mL (ref 15–150)
Iron Saturation: 11 % — ABNORMAL LOW (ref 15–55)
Iron: 45 ug/dL (ref 27–159)
Total Iron Binding Capacity: 426 ug/dL (ref 250–450)
UIBC: 381 ug/dL (ref 131–425)

## 2021-12-10 LAB — HEMOGLOBIN A1C
Est. average glucose Bld gHb Est-mCnc: 114 mg/dL
Hgb A1c MFr Bld: 5.6 % (ref 4.8–5.6)

## 2022-01-05 ENCOUNTER — Other Ambulatory Visit: Payer: Self-pay | Admitting: Obstetrics and Gynecology

## 2022-03-10 ENCOUNTER — Encounter: Payer: Self-pay | Admitting: Neurology

## 2022-03-11 ENCOUNTER — Other Ambulatory Visit: Payer: Self-pay | Admitting: Obstetrics and Gynecology

## 2022-03-16 ENCOUNTER — Other Ambulatory Visit: Payer: Self-pay | Admitting: Obstetrics and Gynecology

## 2022-03-29 NOTE — Progress Notes (Unsigned)
PATIENT: Sarah [name] DOB: 02/04/1981  REASON FOR VISIT: Follow up for MS HISTORY FROM: Patient PRIMARY NEUROLOGIST: Dr. Krista Blue   HISTORY She had a history of relapsing remitting multiple sclerosis, the diagnosis was made in 2002, she presented with right optic neuritis, initially was treated at [facility] with steroid, later Betaseron, she did very well but she had a stillborn at week 77 while using Betaseron before and during pregnancy, she later had more pregnant first born in December 2004, second born August 2006, third child in April 2009 with twins, Betaseron was on hold during her pregnancy.   She had a major flareup in summer of 2007, she had 4 extremity paresthesia, weakness, difficulty walking, she was again put on steroid and Betaseron transiently, she could not tolerate the injection due to flulike illness, nausea or vomiting, and the cost factor. Betaseron was stopped,   Third major flareup was in January 2011, she had significant weakness, fall and paresthesia, difficulty walking driving for 2 months, Dwyane Dee was discussed with her at that time, but because worried the side effect of PML, it was not started,   She was not on any treatment between 2011 and 2014.   Another flareup was in February 2014, she had right eye pain, blurry vision, right arm and leg weakness, increased gait abnormality, constipation, bladder incontinence, somewhat responsive to steroid,   MRI of the brain in 2015 showed multiple bilateral periventricular and subcortical MS lesions, MRI of cervical spine also demonstrate increased T2 at cervical cord at C2-3, C5, left-sided lesion at C7. MRI of the thoracic spine showed diffuse cord abnormality at T1-T9, focal enhancement at ventral surface of the cord at T5-6, extending cephalocaudal fourth 12 mm,   JC virus antibody was negative with titer of 0.19 in May 2015, she was enrolled in research study was treated with Copaxone in April 2014, later she was rolled  over to [place] since early 2014,    Her gait difficulty has much improved with Gilenya treatment,, but still with baseline bilateral lower extremity spasticity and paresthesia, last office visit was in May 2015, she has developed lower back cellulitis in April 2015, she was treated with antibiotics, she has stopped Gilenya for about 2 weeks, then had another flareup of aggressive worsening gait abnormality, right arm and leg weakness, paresthesia, also progressive worsening bilateral visual difficulty, severely restricted visual field, decreased visual acuity. During last office visit in May 2015, She was found to have severely restricted bilateral visual field, decreased visual acuity, counting fingers with right eye, left eye could only read large print, mild spastic quadriplegia, right worse than left, with motor strength of 4/5, ambulate with a wide based unsteady gait   She was sent to hospital admission in May 2015, her symptoms has much improved with high-dose of IV steroid followed by physical therapy, she has lost follow-ups since. 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 June 2015     She was pregnant since September 2016,  had elective C-section on Dec 20 2015, with healthy girl   She presented to emergency room on March 24 2016 for  worsening bilateral hands feet numbness, unsteady gait, weakness, she started to have worsening paresthesia, bladder urgency, incontinence, increased right eye blurry postpartum 6 week around June 2017, progressively worsened   I have personally reviewed repeat MRI of  the brain, cervical spine with and without contrast in August 2017, in comparison with 2015, new enhancing nations in periventricular and subcortical white matter, splenium of corpus callosum consistent with active demyelinating, new  enhancing cord lesion at C4 in the dorsal.   She was enrolled into MS search since Oct 2017, we have reviewed most recent MRI, in comparison to MRI of the brain with and without contrast in August 2017, and 2015, there was increase supratentorium lesion load, there was also 5 foci enhanced, which was not present in MRI of the brain in August 2017.   MRI of the cervical spine in August 2017 showed new enhancing cord lesion at C4,   She started research medication subcutaneous (CD20 monoclonal antibody) every month plus pill (Abulgia).    She reported significant change at the end of April 2018, she developed double vision, dizziness, ataxic gait, she began IV Solu-Medrol infusion 1000 mg daily since Dec 14 2016, complete 3 days today, about 2 weeks after symptoms onset without significant improvement,   She denies signs of infection, reviewed laboratory evaluation showed hemoglobin 10 point 5, creatinine 0.71, cholesterol 156, triglycerides 193, UA on November 15 2016 was negative     UPDATE March 30 2021: She is overall stable, ocrelizumab was started since fall 2017, she works from home, continue has mild baseline gait abnormality, lower extremity spasticity,  She also complains of increased fatigue, chronic migraine headaches about twice a week, mild depression,   Update September 30, 2021 SS: Labs from July 2022 showed normal IgG, IgM, recent vitamin D level was low 17.6, on supplement. On Ocrevus. MRI brain with and without contrast June 2022 stable, no new lesions since October 2018. Next Ocrevus is March 13th at our office. Chronic fatigue, low energy. Bilateral hip pain, did PT, dry needling, pelvic therapy, helps when in therapy, but makes worse when does at home. Sees psych, taking Effexor XR at night to help with sleep. Chronic insominia, has trazodone PRN. Stopped the Provigil due to dark stools, headaches got worsen, it did help with the fatigue. 4 kids (ages 42-18). Headaches better, 2 a  month, Imitrex does help, often has to take 2. Few falls right leg gives way. Works at [employer].  Update March 30, 2022 SS: Labs from February 2023 showed normal IgG 1014, IgM 45.  Hgb 10.8. under a lot of stress, has custody of her 55 month old nephew. Was planning surgery at [facility] for left hip, but had to be postponed, injections were short lived. Having hysterectomy 04/22/22, having heavy bleeding. Ocrevus will be 3 weeks after on 10/5. Feels MS stable. Vision is stable, eyes feel tired, blurry vision in right eye. Numbness in fingers and toes, legs feel heavy. Has to look down at feet to prevent falls. B/B are better since doing pelvic floor therapy. Remains on Effexor, Imitrex works well for migraines.Taking Armodafinil 50 mg, not quite enough.  REVIEW OF SYSTEMS: Out of a complete 14 system review of symptoms, the patient complains only of the following symptoms, and all other reviewed systems are negative.  See HPI  ALLERGIES: No Known Allergies  HOME MEDICATIONS: Outpatient Medications Prior to Visit  Medication Sig Dispense Refill   acetaminophen (TYLENOL) 500 MG tablet Take 500 mg by mouth every 6 (six) hours as needed.     Ferrous Sulfate (IRON PO) Take 225 mg by mouth daily.     LYSINE PO Take 1 tablet by mouth daily.     ocrelizumab 600  mg in sodium chloride 0.9 % 500 mL Inject 600 mg every 6 (six) months into the vein.     Probiotic Product (RESTORA) CAPS Take 1 capsule by mouth daily. 30 capsule 3   traZODone (DESYREL) 100 MG tablet Take 1 tablet (100 mg total) by mouth at bedtime as needed for sleep. 30 tablet 0   Vitamin D, Ergocalciferol, (DRISDOL) 1.25 MG (50000 UNIT) CAPS capsule Take 1 capsule (50,000 Units total) by mouth 2 (two) times a week. 24 capsule 1   Armodafinil 50 MG tablet Take 1 tablet (50 mg total) by mouth daily. 30 tablet 3   SUMAtriptan (IMITREX) 100 MG tablet Take 1 tablet (100 mg total) by mouth once as needed for up to 1 dose for migraine. May  repeat in 2 hours if headache persists or recurs. 30 tablet 1   venlafaxine XR (EFFEXOR XR) 75 MG 24 hr capsule Take 1 capsule (75 mg total) by mouth daily with breakfast. 90 capsule 3   No facility-administered medications prior to visit.    PAST MEDICAL HISTORY: Past Medical History:  Diagnosis Date   Headache    Heart murmur    History of twin pregnancy in prior pregnancy 09/09/2015   Last C/S.  Done due to IUGR of female twin; she died at 81 months old   Late prenatal care affecting pregnancy in second trimester, antepartum 09/09/2015   Melanoma ([place]) 1996   on head   Multiple sclerosis ([place])    Multiple sclerosis ([place])    Multiple sclerosis affecting pregnancy ([place]) 08/22/2015   Followed by Dr. Ronny Flurry at [facility].  Stopped meds when found out she was pregnant. MFM consult/Dr. Nelda Severe: "Although Ms. Lich does have bladder incontinence which is a common symptoms of her MS, she thinks this is more related to her pregnancy rather than MS as she is not having any of her other MS symptoms at this time.  I advised Ms. Troiano to make an appointment to see her Neurologist Dr.    Previous cesarean delivery, antepartum condition or complication 12/03/8339   Prev C/S x 3   Prior pregnancy with fetal demise 08/22/2015   @ 22 weeks.   Status post repeat low transverse cesarean section 12/20/2015   Supervision of high-risk pregnancy 08/22/2015    Clinic  High Risk - Transfer from [facility] Prenatal Labs  Dating  LMP Blood type: O/Positive/-- (10/14 0000) O pos  Genetic Screen 1 Screen:  neg   AFP:     neg Antibody:Negative (10/14 0000)neg  Anatomic [place] Normal Rubella: Immune (10/14 0000)Immune  GTT  Third trimester: 129 RPR: NON REAC (02/23 0850) NR  Flu vaccine declines HBsAg: Negative (10/14 0000) Neg  TDaP vaccine 10/02/15       PAST SURGICAL HISTORY: Past Surgical History:  Procedure Laterality Date   CESAREAN SECTION     x4   CESAREAN SECTION WITH BILATERAL TUBAL LIGATION N/A  12/20/2015   Procedure: CESAREAN SECTION WITH BILATERAL TUBAL LIGATION;  Surgeon: Lavonia Drafts, MD;  Location: [place];  Service: Obstetrics;  Laterality: N/A;   MELANOMA EXCISION  age 32   on head   TUBAL LIGATION  2017    FAMILY HISTORY: Family History  Problem Relation Age of Onset   Diabetes Mother    Diabetes Maternal Uncle    Hypertension Maternal Grandmother     SOCIAL HISTORY: Social History   Socioeconomic History   Marital status: Divorced    Spouse name: Not on file  Number of children: Not on file   Years of education: Not on file   Highest education level: Not on file  Occupational History   Not on file  Tobacco Use   Smoking status: Never   Smokeless tobacco: Never  Vaping Use   Vaping Use: Never used  Substance and Sexual Activity   Alcohol use: Yes    Comment: socially   Drug use: No   Sexual activity: Yes    Birth control/protection: Surgical  Other Topics Concern   Not on file  Social History Narrative   Not on file   Social Determinants of Health   Financial Resource Strain: Not on file  Food Insecurity: Not on file  Transportation Needs: Not on file  Physical Activity: Not on file  Stress: Not on file  Social Connections: Not on file  Intimate Partner Violence: Not on file   PHYSICAL EXAM  Vitals:   03/30/22 0934  BP: 122/78  Pulse: 77  Weight: 208 lb (94.3 kg)  Height: '5\' 5"'$  (1.651 m)   Body mass index is 34.61 kg/m.  Generalized: Well developed, in no acute distress  Neurological examination  Mentation: Alert oriented to time, place, history taking. Follows all commands speech and language fluent Cranial nerve II-XII: Pupils were equal round reactive to light. Extraocular movements were full, visual field were full on confrontational test. Facial sensation and strength were normal. Head turning and shoulder shrug  were normal and symmetric. Motor: Bilateral hip pain, left hip flexion 4/5, left arm  4/5 Sensory: Decreased soft touch sensation left arm and leg Coordination: Cerebellar testing reveals good finger-nose-finger and heel-to-shin bilaterally.  Gait and station: Gait is overall steady, independent, tandem gait is unsteady Reflexes: Deep tendon reflexes are symmetric and normal bilaterally.   DIAGNOSTIC DATA (LABS, IMAGING, TESTING) - I reviewed patient records, labs, notes, testing and imaging myself where available.  Lab Results  Component Value Date   WBC 6.0 12/09/2021   HGB 10.5 (L) 12/09/2021   HCT 32.0 (L) 12/09/2021   MCV 89 12/09/2021   PLT 364 12/09/2021      Component Value Date/Time   NA 139 12/09/2021 1002   K 4.3 12/09/2021 1002   CL 101 12/09/2021 1002   CO2 23 12/09/2021 1002   GLUCOSE 84 12/09/2021 1002   GLUCOSE 95 03/24/2016 1840   BUN 10 12/09/2021 1002   CREATININE 0.76 12/09/2021 1002   CALCIUM 9.2 12/09/2021 1002   PROT 6.8 12/09/2021 1002   ALBUMIN 4.3 12/09/2021 1002   AST 9 12/09/2021 1002   ALT 8 12/09/2021 1002   ALKPHOS 92 12/09/2021 1002   BILITOT 0.2 12/09/2021 1002   GFRNONAA 88 11/22/2019 1541   GFRAA 101 11/22/2019 1541   Lab Results  Component Value Date   CHOL 160 12/09/2021   HDL 60 12/09/2021   LDLCALC 86 12/09/2021   TRIG 73 12/09/2021   CHOLHDL 2.7 12/09/2021   Lab Results  Component Value Date   HGBA1C 5.6 12/09/2021   Lab Results  Component Value Date   VITAMINB12 415 03/31/2016   Lab Results  Component Value Date   TSH 1.190 12/09/2021    ASSESSMENT AND PLAN 41 y.o. year old female   1.  Relapsing remitting multiple sclerosis -Continue Ocrevus, will be about 3 weeks late, pushing dose out since having hysterectomy 9/14, Ocrevus scheduled 10/5 -Check IgG, IgM, IgA -Having routine labs done before hysterectomy, can use those for Ocrevus, considering costs  -On Ocrevus since 2017 -MRI of  the brain June 2022 was overall stable, no new lesions  2.  Depression -Under a lot of stress, continue  Effexor, also helpful for migraines, not seeing psychiatry lately  3.  Fatigue -Increase armodafinil 100 mg daily -Had side effect with Provigil  4.  Chronic migraine headache -Continue Effexor XR 75 mg daily  -Continue Imitrex 100 mg PRN   5. Bilateral hip pain -Has seen [facility], reportedly planning for surgery, had to be put off due to family issues  6. Vitamin D Deficiency -On supplement  Evangeline Dakin, DNP 03/30/2022, 10:07 AM [facility] [address], [place], [place] [address] [phone]

## 2022-03-30 ENCOUNTER — Ambulatory Visit (INDEPENDENT_AMBULATORY_CARE_PROVIDER_SITE_OTHER): Payer: No Typology Code available for payment source | Admitting: Neurology

## 2022-03-30 ENCOUNTER — Encounter: Payer: Self-pay | Admitting: Neurology

## 2022-03-30 VITALS — BP 122/78 | HR 77 | Ht 65.0 in | Wt 208.0 lb

## 2022-03-30 DIAGNOSIS — G35 Multiple sclerosis: Secondary | ICD-10-CM

## 2022-03-30 DIAGNOSIS — G43709 Chronic migraine without aura, not intractable, without status migrainosus: Secondary | ICD-10-CM | POA: Diagnosis not present

## 2022-03-30 DIAGNOSIS — F3289 Other specified depressive episodes: Secondary | ICD-10-CM | POA: Diagnosis not present

## 2022-03-30 DIAGNOSIS — R269 Unspecified abnormalities of gait and mobility: Secondary | ICD-10-CM

## 2022-03-30 MED ORDER — ARMODAFINIL 50 MG PO TABS: 100.0000 mg | ORAL_TABLET | Freq: Every day | ORAL | 3 refills | Status: AC

## 2022-03-30 MED ORDER — VENLAFAXINE HCL ER 75 MG PO CP24: 75.0000 mg | ORAL_CAPSULE | Freq: Every day | ORAL | 3 refills | Status: DC

## 2022-03-30 MED ORDER — SUMATRIPTAN SUCCINATE 100 MG PO TABS: 100.0000 mg | ORAL_TABLET | Freq: Once | ORAL | 1 refills | Status: AC | PRN

## 2022-03-30 NOTE — Patient Instructions (Addendum)
Increase armodafinil to 100 mg in AM, keep other medications See you back in 6 months   Meds ordered this encounter  Medications   venlafaxine XR (EFFEXOR XR) 75 MG 24 hr capsule    Sig: Take 1 capsule (75 mg total) by mouth daily with breakfast.    Dispense:  90 capsule    Refill:  3   SUMAtriptan (IMITREX) 100 MG tablet    Sig: Take 1 tablet (100 mg total) by mouth once as needed for up to 1 dose for migraine. May repeat in 2 hours if headache persists or recurs.    Dispense:  30 tablet    Refill:  1   Armodafinil 50 MG tablet    Sig: Take 2 tablets (100 mg total) by mouth daily. In the AM    Dispense:  60 tablet    Refill:  3

## 2022-03-31 ENCOUNTER — Telehealth: Payer: Self-pay | Admitting: Neurology

## 2022-03-31 LAB — IGG, IGA, IGM
IgA/Immunoglobulin A, Serum: 414 mg/dL — ABNORMAL HIGH (ref 87–352)
IgG (Immunoglobin G), Serum: 950 mg/dL (ref 586–1602)
IgM (Immunoglobulin M), Srm: 42 mg/dL (ref 26–217)

## 2022-03-31 NOTE — Telephone Encounter (Signed)
Sarah [name] from Sarah [name]: Sarah Givens A MD? called stating that they are still waiting on the surgical clearance for the pt's Sept 14th surgery. Please fax to [phone] or if that one is busy you can send to [phone]

## 2022-04-01 NOTE — Telephone Encounter (Signed)
I have looked for this fax this morning and could not locate. I have requested the surgical clearance form to be faxed again to # [phone].

## 2022-04-08 ENCOUNTER — Telehealth: Payer: Self-pay

## 2022-04-08 NOTE — Telephone Encounter (Signed)
PA for armodafinil has been sent via cmm   (Key: [id number])  [employer] has not yet replied to your PA request. You may close this dialog, return to your dashboard, and perform other tasks.  To check for an update later, open this request again from your dashboard.  If [employer] has not replied to your request within 72 hours please contact [employer] at [phone].

## 2022-04-14 NOTE — Pre-Procedure Instructions (Signed)
Surgical Instructions    Your procedure is scheduled on Thursday 04/22/22.   Report to [facility] Main Entrance "A" at 06:30 A.M., then check in with the Admitting office.  Call this number if you have problems the morning of surgery:  [phone]   If you have any questions prior to your surgery date call [phone]: Open Monday-Friday 8am-4pm    Remember:  Do not eat or drink after midnight the night before your surgery    Take these medicines the morning of surgery with A SIP OF WATER:   venlafaxine XR (EFFEXOR XR)    Take these medicines if needed:   SUMAtriptan (IMITREX)   Please follow your surgeon's instructions regarding Armodafinil. If you have not received instructions then please contact your surgeon's office for instructions.  As of today, STOP taking any Aspirin (unless otherwise instructed by your surgeon) Aleve, Naproxen, Ibuprofen, Motrin, Advil, Goody's, BC's, all herbal medications, fish oil, and all vitamins.           Do not wear jewelry or makeup. Do not wear lotions, powders, perfumes/cologne or deodorant. Do not shave 48 hours prior to surgery.  Men may shave face and neck. Do not bring valuables to the hospital. Do not wear nail polish, gel polish, artificial nails, or any other type of covering on natural nails (fingers and toes) If you have artificial nails or gel coating that need to be removed by a nail salon, please have this removed prior to surgery. Artificial nails or gel coating may interfere with anesthesia's ability to adequately monitor your vital signs.  [facility] is not responsible for any belongings or valuables.    Do NOT Smoke (Tobacco/Vaping)  24 hours prior to your procedure  If you use a CPAP at night, you may bring your mask for your overnight stay.   Contacts, glasses, hearing aids, dentures or partials may not be worn into surgery, please bring cases for these belongings   For patients admitted to the hospital, discharge  time will be determined by your treatment team.   Patients discharged the day of surgery will not be allowed to drive home, and someone needs to stay with them for 24 hours.   SURGICAL WAITING ROOM VISITATION Patients having surgery or a procedure may have no more than 2 support people in the waiting area - these visitors may rotate.   Children under the age of 75 must have an adult with them who is not the patient. If the patient needs to stay at the hospital during part of their recovery, the visitor guidelines for inpatient rooms apply. Pre-op nurse will coordinate an appropriate time for 1 support person to accompany patient in pre-op.  This support person may not rotate.   Please refer to the [facility] website for the visitor guidelines for Inpatients (after your surgery is over and you are in a regular room).    Special instructions:    Oral Hygiene is also important to reduce your risk of infection.  Remember - BRUSH YOUR TEETH THE MORNING OF SURGERY WITH YOUR REGULAR TOOTHPASTE   - Preparing For Surgery  Before surgery, you can play an important role. Because skin is not sterile, your skin needs to be as free of germs as possible. You can reduce the number of germs on your skin by washing with CHG (chlorahexidine gluconate) Soap before surgery.  CHG is an antiseptic cleaner which kills germs and bonds with the skin to continue killing germs even after washing.  Please do not use if you have an allergy to CHG or antibacterial soaps. If your skin becomes reddened/irritated stop using the CHG.  Do not shave (including legs and underarms) for at least 48 hours prior to first CHG shower. It is OK to shave your face.  Please follow these instructions carefully.     Shower the NIGHT BEFORE SURGERY and the MORNING OF SURGERY with CHG Soap.   If you chose to wash your hair, wash your hair first as usual with your normal shampoo. After you shampoo, rinse your hair and body  thoroughly to remove the shampoo.  Then [employer] and genitals (private parts) with your normal soap and rinse thoroughly to remove soap.  After that Use CHG Soap as you would any other liquid soap. You can apply CHG directly to the skin and wash gently with a scrungie or a clean washcloth.   Apply the CHG Soap to your body ONLY FROM THE NECK DOWN.  Do not use on open wounds or open sores. Avoid contact with your eyes, ears, mouth and genitals (private parts). Wash Face and genitals (private parts)  with your normal soap.   Wash thoroughly, paying special attention to the area where your surgery will be performed.  Thoroughly rinse your body with warm water from the neck down.  DO NOT shower/wash with your normal soap after using and rinsing off the CHG Soap.  Pat yourself dry with a CLEAN TOWEL.  Wear CLEAN PAJAMAS to bed the night before surgery  Place CLEAN SHEETS on your bed the night before your surgery  DO NOT SLEEP WITH PETS.   Day of Surgery:  Take a shower with CHG soap. Wear Clean/Comfortable clothing the morning of surgery Do not apply any deodorants/lotions.   Remember to brush your teeth WITH YOUR REGULAR TOOTHPASTE.    If you received a COVID test during your pre-op visit, it is requested that you wear a mask when out in public, stay away from anyone that may not be feeling well, and notify your surgeon if you develop symptoms. If you have been in contact with anyone that has tested positive in the last 10 days, please notify your surgeon.    Please read over the following fact sheets that you were given.

## 2022-04-15 ENCOUNTER — Encounter (HOSPITAL_COMMUNITY)
Admission: RE | Admit: 2022-04-15 | Discharge: 2022-04-15 | Disposition: A | Discharge status: Home / Self Care | Payer: No Typology Code available for payment source | Source: Ambulatory Visit | Attending: Obstetrics and Gynecology | Admitting: Obstetrics and Gynecology

## 2022-04-15 ENCOUNTER — Other Ambulatory Visit: Payer: Self-pay

## 2022-04-15 ENCOUNTER — Encounter (HOSPITAL_COMMUNITY): Payer: Self-pay

## 2022-04-15 ENCOUNTER — Encounter: Payer: Self-pay | Admitting: Neurology

## 2022-04-15 VITALS — BP 113/78 | HR 85 | Temp 98.4°F | Resp 18 | Ht 65.0 in | Wt 208.4 lb

## 2022-04-15 DIAGNOSIS — Z01812 Encounter for preprocedural laboratory examination: Secondary | ICD-10-CM | POA: Diagnosis not present

## 2022-04-15 DIAGNOSIS — G35 Multiple sclerosis: Secondary | ICD-10-CM | POA: Insufficient documentation

## 2022-04-15 DIAGNOSIS — N939 Abnormal uterine and vaginal bleeding, unspecified: Secondary | ICD-10-CM | POA: Diagnosis not present

## 2022-04-15 DIAGNOSIS — D649 Anemia, unspecified: Secondary | ICD-10-CM | POA: Diagnosis not present

## 2022-04-15 DIAGNOSIS — Z01818 Encounter for other preprocedural examination: Secondary | ICD-10-CM

## 2022-04-15 HISTORY — DX: Anxiety disorder, unspecified: F41.9

## 2022-04-15 HISTORY — DX: Depression, unspecified: F32.A

## 2022-04-15 LAB — BASIC METABOLIC PANEL
Anion gap: 8 (ref 5–15)
BUN: 7 mg/dL (ref 6–20)
CO2: 26 mmol/L (ref 22–32)
Calcium: 9.2 mg/dL (ref 8.9–10.3)
Chloride: 106 mmol/L (ref 98–111)
Creatinine, Ser: 0.76 mg/dL (ref 0.44–1.00)
GFR, Estimated: >60 mL/min (ref >60)
Glucose, Bld: 100 mg/dL — ABNORMAL HIGH (ref 70–99)
Potassium: 4 mmol/L (ref 3.5–5.1)
Sodium: 140 mmol/L (ref 135–145)

## 2022-04-15 LAB — CBC
HCT: 34.9 % — ABNORMAL LOW (ref 36.0–46.0)
Hemoglobin: 11.4 g/dL — ABNORMAL LOW (ref 12.0–15.0)
MCH: 29.8 pg (ref 26.0–34.0)
MCHC: 32.7 g/dL (ref 30.0–36.0)
MCV: 91.4 fL (ref 80.0–100.0)
Platelets: 316 10*3/uL (ref 150–400)
RBC: 3.82 MIL/uL — ABNORMAL LOW (ref 3.87–5.11)
RDW: 13.2 % (ref 11.5–15.5)
WBC: 6.9 10*3/uL (ref 4.0–10.5)
nRBC: 0 % (ref 0.0–0.2)

## 2022-04-15 LAB — TYPE AND SCREEN
ABO/RH(D): O POS
Antibody Screen: NEGATIVE

## 2022-04-15 NOTE — Telephone Encounter (Signed)
Received fax for additional clinical information. Request sent to # [phone], confirmation received.

## 2022-04-15 NOTE — Progress Notes (Signed)
PCP - Dr. Darleen Crocker Neuro: Dr. Marcial Pacas Cardiologist - Denies  PPM/ICD - Denies  Chest x-ray - N/A EKG - N/A Stress Test - Denies ECHO - Denies Cardiac Cath - Denies  Sleep Study - Denies  Diabetes: Denies  Blood Thinner Instructions: N/A Aspirin Instructions: N/A  ERAS Protcol - No  COVID TEST- N/A   Anesthesia review: Yes, hx of heart murmur as a child, hx of MS  Patient denies shortness of breath, fever, cough and chest pain at PAT appointment   All instructions explained to the patient, with a verbal understanding of the material. Patient agrees to go over the instructions while at home for a better understanding. Patient also instructed to self quarantine after being tested for COVID-19. The opportunity to ask questions was provided.

## 2022-04-16 ENCOUNTER — Encounter (HOSPITAL_COMMUNITY): Payer: Self-pay

## 2022-04-16 NOTE — Progress Notes (Signed)
Anesthesia Chart Review:  Follows with neurology for hx of multiple sclerosis. Last seen 03/30/22. Per note, "under a lot of stress, has custody of her 59 month old nephew. Was planning surgery at [facility] for left hip, but had to be postponed, injections were short lived. Having hysterectomy 04/22/22, having heavy bleeding. Ocrevus will be 3 weeks after on 10/5. Feels MS stable. Vision is stable, eyes feel tired, blurry vision in right eye. Numbness in fingers and toes, legs feel heavy. Has to look down at feet to prevent falls. B/B are better since doing pelvic floor therapy. Remains on Effexor, Imitrex works well for migraines.Taking Armodafinil 50 mg, not quite enough." Her next dose of Ocrevus is on hold until after hysterectomy on 9/14.  Proep labs reviewed, mild anemia with Hgb 11.4.   Wynonia Musty [facility] Short Stay Center/Anesthesiology Phone [phone] 04/16/2022 11:50 AM

## 2022-04-16 NOTE — Anesthesia Preprocedure Evaluation (Addendum)
Anesthesia Evaluation  Patient identified by MRN, date of birth, ID band Patient awake    Reviewed: Allergy & Precautions, NPO status , Patient's Chart, lab work & pertinent test results  History of Anesthesia Complications Negative for: history of anesthetic complications  Airway Mallampati: II  TM Distance: >3 FB Neck ROM: Full    Dental  (+) Teeth Intact, Dental Advisory Given   Pulmonary Current Smoker,    Pulmonary exam normal        Cardiovascular negative cardio ROS Normal cardiovascular exam     Neuro/Psych  Headaches, Multiple sclerosis    GI/Hepatic negative GI ROS, Neg liver ROS,   Endo/Other  negative endocrine ROS  Renal/GU negative Renal ROS  negative genitourinary   Musculoskeletal negative musculoskeletal ROS (+)   Abdominal   Peds  Hematology  (+) Blood dyscrasia, anemia ,   Anesthesia Other Findings   Reproductive/Obstetrics Excessive menstrual bleeding                           Anesthesia Physical Anesthesia Plan  ASA: 3  Anesthesia Plan: General   Post-op Pain Management: Tylenol PO (pre-op)* and Toradol IV (intra-op)*   Induction: Intravenous  PONV Risk Score and Plan: 3 and Ondansetron, Dexamethasone, Treatment may vary due to age or medical condition and Midazolam  Airway Management Planned: Oral ETT  Additional Equipment: None  Intra-op Plan:   Post-operative Plan: Extubation in OR  Informed Consent: I have reviewed the patients History and Physical, chart, labs and discussed the procedure including the risks, benefits and alternatives for the proposed anesthesia with the patient or authorized representative who has indicated his/her understanding and acceptance.     Dental advisory given  Plan Discussed with:   Anesthesia Plan Comments: (PAT note by Karoline Caldwell, PA-C: Follows with neurology for hx of multiple sclerosis. Last seen 03/30/22. Per  note, "under a lot of stress, has custody of her 52 month old nephew. Was planning surgery at [facility] for left hip, but had to be postponed, injections were short lived. Having hysterectomy 04/22/22,having heavy bleeding. Ocrevus will be 3 weeks after on 10/5. Feels MS stable. Vision is stable, eyes feel tired, blurry vision in right eye. Numbness in fingers and toes, legs feel heavy. Has to look down at feet to prevent falls. B/B are better since doing pelvic floor therapy. Remains on Effexor, Imitrex works well for migraines.Taking Armodafinil 50 mg,not quite enough." Her next dose of Ocrevus is on hold until after hysterectomy on 9/14.  Proep labs reviewed, mild anemia with Hgb 11.4. )      Anesthesia Quick Evaluation

## 2022-04-19 NOTE — Telephone Encounter (Signed)
PW has been received and placed on providers desk for review.

## 2022-04-19 NOTE — Telephone Encounter (Signed)
Sarah [name] from [facility] is asking for a call back  re: surgical clearance for an operation on 9-14 please call [phone]

## 2022-04-19 NOTE — Telephone Encounter (Signed)
I called Sarah [name] back and advised again I have not received any notes as to why the clearance is needed and what the clearance is for. I requested again for this to be send and gave the direct fax # to pod 2 for [place] to review.

## 2022-04-20 NOTE — Telephone Encounter (Addendum)
Patient can have hysterectomy on Ocrevus. We should push out her Ocrevus 4 weeks from surgery. I will dictate a letter.

## 2022-04-21 NOTE — H&P (Addendum)
Sarah [name] is an 41 y.o. female. For LAVH b salpingectomy for irreg vaginal bleeding over the last yr.  She changes a tampon q 4 hours and has pain unrelieved with advil or tylenol.  She had an [facility] 12/2021 it was benign.  She was given several options and has decided she wants a hysterectomy  Pertinent Gynecological History: Menses: flow is moderate Bleeding: intermenstrual bleeding Contraception: tubal ligation DES exposure: unknown Blood transfusions: none Sexually transmitted diseases: no past history Previous GYN Procedures:  C/S TIMES FOUR   Last mammogram:  NA  Date:  Last pap: normal Date: 2023 OB History: G4, P4   Menstrual History: Menarche age: 89 Patient's last menstrual period was 04/08/2022.    Past Medical History:  Diagnosis Date   Anxiety    Depression    Headache    Heart murmur    childhood   History of twin pregnancy in prior pregnancy 09/09/2015   Last C/S.  Done due to IUGR of female twin; she died at 53 months old   Late prenatal care affecting pregnancy in second trimester, antepartum 09/09/2015   Melanoma ([place]) 1996   on head   Multiple sclerosis ([place])    Multiple sclerosis ([place])    Multiple sclerosis affecting pregnancy ([place]) 08/22/2015   Followed by Dr. Ronny Flurry at [facility].  Stopped meds when found out she was pregnant. MFM consult/Dr. Nelda Severe: "Although Ms. Lope does have bladder incontinence which is a common symptoms of her MS, she thinks this is more related to her pregnancy rather than MS as she is not having any of her other MS symptoms at this time.  I advised Ms. Christian to make an appointment to see her Neurologist Dr.    Previous cesarean delivery, antepartum condition or complication 78/29/5621   Prev C/S x 3   Prior pregnancy with fetal demise 08/22/2015   @ 22 weeks.   Status post repeat low transverse cesarean section 12/20/2015   Supervision of high-risk pregnancy 08/22/2015    Clinic  High Risk - Transfer from [facility] Prenatal Labs  Dating  LMP Blood type: O/Positive/-- (10/14 0000) O pos  Genetic Screen 1 Screen:  neg   AFP:     neg Antibody:Negative (10/14 0000)neg  Anatomic [place] Normal Rubella: Immune (10/14 0000)Immune  GTT  Third trimester: 129 RPR: NON REAC (02/23 0850) NR  Flu vaccine declines HBsAg: Negative (10/14 0000) Neg  TDaP vaccine 10/02/15       Past Surgical History:  Procedure Laterality Date   CESAREAN SECTION     x4   CESAREAN SECTION WITH BILATERAL TUBAL LIGATION N/A 12/20/2015   Procedure: CESAREAN SECTION WITH BILATERAL TUBAL LIGATION;  Surgeon: Lavonia Drafts, MD;  Location: [facility];  Service: Obstetrics;  Laterality: N/A;   MELANOMA EXCISION  age 35   on head   TUBAL LIGATION  2017    Family History  Problem Relation Age of Onset   Diabetes Mother    Diabetes Maternal Uncle    Hypertension Maternal Grandmother     Social History:  reports that she has been smoking cigars. She has never used smokeless tobacco. She reports current alcohol use. She reports that she does not use drugs.  Allergies: No Known Allergies  No medications prior to admission.    Review of Systems  Last menstrual period 04/08/2022, unknown if currently breastfeeding. Physical Exam Physical Examination: General appearance - alert, well appearing, and in no distress Mental status - alert, oriented  to person, place, and time Chest - clear to auscultation, no wheezes, rales or rhonchi, symmetric air entry Heart - normal rate and regular rhythm Abdomen - soft, nontender, nondistended, no masses or organomegaly Breasts - breasts appear normal, no suspicious masses, no skin or nipple changes or axillary nodes Pelvic - normal external genitalia, vulva, vagina, cervix, uterus and adnexa   US UTERUS 14 WEEK SIZE AND ELONGATED.  NORMAL OVARIESS  No results found for this or any previous visit (from the past 24 hour(s)).  No results found.  Assessment/Plan: IRREG VB EMBX  BENIGN ALL TREATMENTS REVIEWED.  SHE DESIRES HYSTERECTOMY WILL ATTEMPT LAVH H/O CS TIME FOUR.  BOWEL PREP GIVEN SHE UNDERSTANDS MAY NEED TO DO TAH  SHE ALSO UNDERSTANDS THE RISKS ARE BUT NOT LIMITED TO BLEEDING, INFECTION DAMAGE TO INTERNAL ORGANS LIKE BOWEL AND BLADDER.   Eugune Sine A Corinthian Mizrahi 04/21/2022, 11:43 PM Date of Initial H&P: 04/21/22  History reviewed, patient examined, no change in status, stable for surgery.

## 2022-04-21 NOTE — Telephone Encounter (Signed)
Late entry 04/20/2022 surgical clearance form was sent with confirmation of receipt.

## 2022-04-22 ENCOUNTER — Other Ambulatory Visit: Payer: Self-pay

## 2022-04-22 ENCOUNTER — Ambulatory Visit (HOSPITAL_COMMUNITY): Payer: No Typology Code available for payment source | Admitting: Physician Assistant

## 2022-04-22 ENCOUNTER — Inpatient Hospital Stay (HOSPITAL_COMMUNITY)
Admission: AD | Admit: 2022-04-22 | Discharge: 2022-04-24 | DRG: 743 | Disposition: A | Discharge status: Home / Self Care | Payer: No Typology Code available for payment source | Attending: Obstetrics and Gynecology | Admitting: Obstetrics and Gynecology

## 2022-04-22 ENCOUNTER — Encounter (HOSPITAL_COMMUNITY)
Admission: AD | Disposition: A | Discharge status: Home / Self Care | Payer: Self-pay | Source: Home / Self Care | Attending: Obstetrics and Gynecology

## 2022-04-22 ENCOUNTER — Ambulatory Visit (HOSPITAL_COMMUNITY): Payer: No Typology Code available for payment source | Admitting: Anesthesiology

## 2022-04-22 DIAGNOSIS — Z85820 Personal history of malignant melanoma of skin: Secondary | ICD-10-CM | POA: Diagnosis not present

## 2022-04-22 DIAGNOSIS — G35 Multiple sclerosis: Secondary | ICD-10-CM | POA: Diagnosis present

## 2022-04-22 DIAGNOSIS — N856 Intrauterine synechiae: Secondary | ICD-10-CM | POA: Diagnosis present

## 2022-04-22 DIAGNOSIS — N946 Dysmenorrhea, unspecified: Secondary | ICD-10-CM | POA: Diagnosis present

## 2022-04-22 DIAGNOSIS — N736 Female pelvic peritoneal adhesions (postinfective): Secondary | ICD-10-CM | POA: Diagnosis present

## 2022-04-22 DIAGNOSIS — Z5331 Laparoscopic surgical procedure converted to open procedure: Secondary | ICD-10-CM | POA: Diagnosis not present

## 2022-04-22 DIAGNOSIS — D649 Anemia, unspecified: Secondary | ICD-10-CM

## 2022-04-22 DIAGNOSIS — Z90710 Acquired absence of both cervix and uterus: Secondary | ICD-10-CM | POA: Diagnosis present

## 2022-04-22 DIAGNOSIS — N920 Excessive and frequent menstruation with regular cycle: Principal | ICD-10-CM | POA: Diagnosis present

## 2022-04-22 DIAGNOSIS — Z01818 Encounter for other preprocedural examination: Secondary | ICD-10-CM

## 2022-04-22 DIAGNOSIS — F17290 Nicotine dependence, other tobacco product, uncomplicated: Secondary | ICD-10-CM | POA: Diagnosis present

## 2022-04-22 HISTORY — PX: ABDOMINAL HYSTERECTOMY: SHX81

## 2022-04-22 HISTORY — PX: CYSTOSCOPY: SHX5120

## 2022-04-22 HISTORY — PX: LAPAROSCOPIC VAGINAL HYSTERECTOMY WITH SALPINGECTOMY: SHX6680

## 2022-04-22 LAB — POCT PREGNANCY, URINE: Preg Test, Ur: NEGATIVE

## 2022-04-22 SURGERY — HYSTERECTOMY, VAGINAL, LAPAROSCOPY-ASSISTED, WITH SALPINGECTOMY
Anesthesia: General | Site: Bladder

## 2022-04-22 MED ORDER — LACTATED RINGERS IV SOLN: INTRAVENOUS | Status: DC

## 2022-04-22 MED ORDER — DEXAMETHASONE SODIUM PHOSPHATE 10 MG/ML IJ SOLN
INTRAMUSCULAR | Status: DC | PRN
  Administered 2022-04-22: 10 mg via INTRAVENOUS

## 2022-04-22 MED ORDER — OXYCODONE HCL 5 MG PO TABS: 5.0000 mg | ORAL_TABLET | Freq: Once | ORAL | Status: DC | PRN

## 2022-04-22 MED ORDER — DEXAMETHASONE SODIUM PHOSPHATE 10 MG/ML IJ SOLN
INTRAMUSCULAR | Status: AC
  Filled 2022-04-22: qty 1

## 2022-04-22 MED ORDER — FENTANYL CITRATE (PF) 250 MCG/5ML IJ SOLN
INTRAMUSCULAR | Status: DC | PRN
  Administered 2022-04-22: 100 ug via INTRAVENOUS
  Administered 2022-04-22 (×3): 50 ug via INTRAVENOUS

## 2022-04-22 MED ORDER — ONDANSETRON HCL 4 MG/2ML IJ SOLN
INTRAMUSCULAR | Status: DC | PRN
  Administered 2022-04-22: 4 mg via INTRAVENOUS

## 2022-04-22 MED ORDER — SODIUM CHLORIDE 0.9% FLUSH: 9.0000 mL | INTRAVENOUS | Status: DC | PRN

## 2022-04-22 MED ORDER — VASOPRESSIN 20 UNIT/ML IV SOLN
INTRAVENOUS | Status: AC
  Filled 2022-04-22: qty 1

## 2022-04-22 MED ORDER — DIPHENHYDRAMINE HCL 12.5 MG/5ML PO ELIX: 12.5000 mg | ORAL_SOLUTION | Freq: Four times a day (QID) | ORAL | Status: DC | PRN

## 2022-04-22 MED ORDER — ONDANSETRON HCL 4 MG/2ML IJ SOLN
INTRAMUSCULAR | Status: AC
  Filled 2022-04-22: qty 2

## 2022-04-22 MED ORDER — SODIUM CHLORIDE 0.9 % IR SOLN
Status: DC | PRN
  Administered 2022-04-22: 1000 mL via INTRAVESICAL

## 2022-04-22 MED ORDER — ORAL CARE MOUTH RINSE: 15.0000 mL | Freq: Once | OROMUCOSAL | Status: AC

## 2022-04-22 MED ORDER — CEFAZOLIN SODIUM-DEXTROSE 2-4 GM/100ML-% IV SOLN
2.0000 g | INTRAVENOUS | Status: AC
  Administered 2022-04-22: 2 g via INTRAVENOUS
  Filled 2022-04-22: qty 100

## 2022-04-22 MED ORDER — PROPOFOL 10 MG/ML IV BOLUS
INTRAVENOUS | Status: AC
  Filled 2022-04-22: qty 20

## 2022-04-22 MED ORDER — HYDROMORPHONE 1 MG/ML IV SOLN
INTRAVENOUS | Status: DC
  Administered 2022-04-22 – 2022-04-23 (×2): 0.2 mg via INTRAVENOUS
  Administered 2022-04-23: 1 mg via INTRAVENOUS

## 2022-04-22 MED ORDER — BUPIVACAINE HCL (PF) 0.25 % IJ SOLN
INTRAMUSCULAR | Status: AC
  Filled 2022-04-22: qty 30

## 2022-04-22 MED ORDER — SODIUM CHLORIDE (PF) 0.9 % IJ SOLN
INTRAMUSCULAR | Status: AC
  Filled 2022-04-22: qty 100

## 2022-04-22 MED ORDER — POVIDONE-IODINE 10 % EX SWAB
2.0000 | Freq: Once | CUTANEOUS | Status: AC
  Administered 2022-04-22: 2 via TOPICAL

## 2022-04-22 MED ORDER — MIDAZOLAM HCL 2 MG/2ML IJ SOLN
INTRAMUSCULAR | Status: DC | PRN
  Administered 2022-04-22: 2 mg via INTRAVENOUS

## 2022-04-22 MED ORDER — FENTANYL CITRATE (PF) 100 MCG/2ML IJ SOLN
INTRAMUSCULAR | Status: AC
  Filled 2022-04-22: qty 2

## 2022-04-22 MED ORDER — DEXMEDETOMIDINE HCL IN NACL 80 MCG/20ML IV SOLN
INTRAVENOUS | Status: DC | PRN
  Administered 2022-04-22: 8 ug via BUCCAL

## 2022-04-22 MED ORDER — MENTHOL 3 MG MT LOZG: 1.0000 | LOZENGE | OROMUCOSAL | Status: DC | PRN

## 2022-04-22 MED ORDER — ACETAMINOPHEN 500 MG PO TABS
1000.0000 mg | ORAL_TABLET | Freq: Once | ORAL | Status: AC
  Administered 2022-04-22: 1000 mg via ORAL
  Filled 2022-04-22: qty 2

## 2022-04-22 MED ORDER — OXYCODONE HCL 5 MG/5ML PO SOLN: 5.0000 mg | Freq: Once | ORAL | Status: DC | PRN

## 2022-04-22 MED ORDER — CHLORHEXIDINE GLUCONATE 0.12 % MT SOLN
15.0000 mL | Freq: Once | OROMUCOSAL | Status: AC
  Administered 2022-04-22: 15 mL via OROMUCOSAL

## 2022-04-22 MED ORDER — FLUORESCEIN SODIUM 10 % IV SOLN
INTRAVENOUS | Status: AC
  Filled 2022-04-22: qty 5

## 2022-04-22 MED ORDER — BUPIVACAINE HCL 0.25 % IJ SOLN
INTRAMUSCULAR | Status: DC | PRN
  Administered 2022-04-22: 5 mL

## 2022-04-22 MED ORDER — DIPHENHYDRAMINE HCL 50 MG/ML IJ SOLN: 12.5000 mg | Freq: Four times a day (QID) | INTRAMUSCULAR | Status: DC | PRN

## 2022-04-22 MED ORDER — SIMETHICONE 80 MG PO CHEW: 80.0000 mg | CHEWABLE_TABLET | Freq: Four times a day (QID) | ORAL | Status: DC | PRN

## 2022-04-22 MED ORDER — DEXTROSE IN LACTATED RINGERS 5 % IV SOLN: INTRAVENOUS | Status: DC

## 2022-04-22 MED ORDER — PROPOFOL 10 MG/ML IV BOLUS
INTRAVENOUS | Status: DC | PRN
  Administered 2022-04-22: 200 mg via INTRAVENOUS

## 2022-04-22 MED ORDER — CHLORHEXIDINE GLUCONATE 0.12 % MT SOLN
15.0000 mL | Freq: Once | OROMUCOSAL | Status: AC
  Filled 2022-04-22: qty 15

## 2022-04-22 MED ORDER — MIDAZOLAM HCL 2 MG/2ML IJ SOLN
INTRAMUSCULAR | Status: AC
  Filled 2022-04-22: qty 2

## 2022-04-22 MED ORDER — ONDANSETRON HCL 4 MG/2ML IJ SOLN
4.0000 mg | Freq: Four times a day (QID) | INTRAMUSCULAR | Status: DC | PRN
  Administered 2022-04-23: 4 mg via INTRAVENOUS
  Filled 2022-04-22: qty 2

## 2022-04-22 MED ORDER — FENTANYL CITRATE (PF) 250 MCG/5ML IJ SOLN
INTRAMUSCULAR | Status: AC
  Filled 2022-04-22: qty 5

## 2022-04-22 MED ORDER — FENTANYL CITRATE (PF) 100 MCG/2ML IJ SOLN
25.0000 ug | INTRAMUSCULAR | Status: DC | PRN
  Administered 2022-04-22 (×2): 50 ug via INTRAVENOUS

## 2022-04-22 MED ORDER — POLYETHYLENE GLYCOL 3350 17 G PO PACK: 17.0000 g | PACK | Freq: Every day | ORAL | Status: DC | PRN

## 2022-04-22 MED ORDER — ONDANSETRON HCL 4 MG PO TABS: 4.0000 mg | ORAL_TABLET | Freq: Four times a day (QID) | ORAL | Status: DC | PRN

## 2022-04-22 MED ORDER — PHENYLEPHRINE 80 MCG/ML (10ML) SYRINGE FOR IV PUSH (FOR BLOOD PRESSURE SUPPORT)
PREFILLED_SYRINGE | INTRAVENOUS | Status: AC
  Filled 2022-04-22: qty 10

## 2022-04-22 MED ORDER — LIDOCAINE 2% (20 MG/ML) 5 ML SYRINGE
INTRAMUSCULAR | Status: DC | PRN
  Administered 2022-04-22: 100 mg via INTRAVENOUS

## 2022-04-22 MED ORDER — SUGAMMADEX SODIUM 200 MG/2ML IV SOLN
INTRAVENOUS | Status: DC | PRN
  Administered 2022-04-22: 200 mg via INTRAVENOUS

## 2022-04-22 MED ORDER — ONDANSETRON HCL 4 MG/2ML IJ SOLN: 4.0000 mg | Freq: Once | INTRAMUSCULAR | Status: DC | PRN

## 2022-04-22 MED ORDER — NALOXONE HCL 0.4 MG/ML IJ SOLN: 0.4000 mg | INTRAMUSCULAR | Status: DC | PRN

## 2022-04-22 MED ORDER — DOCUSATE SODIUM 100 MG PO CAPS
100.0000 mg | ORAL_CAPSULE | Freq: Two times a day (BID) | ORAL | Status: DC
  Administered 2022-04-22 – 2022-04-23 (×3): 100 mg via ORAL
  Filled 2022-04-22 (×3): qty 1

## 2022-04-22 MED ORDER — METOCLOPRAMIDE HCL 5 MG/ML IJ SOLN
10.0000 mg | Freq: Three times a day (TID) | INTRAMUSCULAR | Status: DC
  Administered 2022-04-22 – 2022-04-23 (×3): 10 mg via INTRAVENOUS
  Filled 2022-04-22 (×3): qty 2

## 2022-04-22 MED ORDER — HEMOSTATIC AGENTS (NO CHARGE) OPTIME
TOPICAL | Status: DC | PRN
  Administered 2022-04-22: 1 via TOPICAL

## 2022-04-22 MED ORDER — SUCCINYLCHOLINE CHLORIDE 200 MG/10ML IV SOSY
PREFILLED_SYRINGE | INTRAVENOUS | Status: DC | PRN
  Administered 2022-04-22: 100 mg via INTRAVENOUS

## 2022-04-22 MED ORDER — ROCURONIUM BROMIDE 10 MG/ML (PF) SYRINGE
PREFILLED_SYRINGE | INTRAVENOUS | Status: DC | PRN
  Administered 2022-04-22 (×2): 10 mg via INTRAVENOUS
  Administered 2022-04-22: 50 mg via INTRAVENOUS
  Administered 2022-04-22: 10 mg via INTRAVENOUS

## 2022-04-22 MED ORDER — ONDANSETRON HCL 4 MG/2ML IJ SOLN: 4.0000 mg | Freq: Four times a day (QID) | INTRAMUSCULAR | Status: DC | PRN

## 2022-04-22 MED ORDER — ROCURONIUM BROMIDE 10 MG/ML (PF) SYRINGE
PREFILLED_SYRINGE | INTRAVENOUS | Status: AC
  Filled 2022-04-22: qty 50

## 2022-04-22 MED ORDER — 0.9 % SODIUM CHLORIDE (POUR BTL) OPTIME
TOPICAL | Status: DC | PRN
  Administered 2022-04-22 (×2): 1000 mL

## 2022-04-22 MED ORDER — HYDROMORPHONE 1 MG/ML IV SOLN
INTRAVENOUS | Status: AC
  Filled 2022-04-22: qty 30

## 2022-04-22 MED ORDER — EPHEDRINE 5 MG/ML INJ
INTRAVENOUS | Status: AC
  Filled 2022-04-22: qty 5

## 2022-04-22 MED ORDER — OXYCODONE-ACETAMINOPHEN 5-325 MG PO TABS
1.0000 | ORAL_TABLET | ORAL | Status: DC | PRN
  Administered 2022-04-23 (×2): 1 via ORAL
  Filled 2022-04-22 (×2): qty 1

## 2022-04-22 MED ORDER — AMISULPRIDE (ANTIEMETIC) 5 MG/2ML IV SOLN
10.0000 mg | Freq: Once | INTRAVENOUS | Status: DC | PRN
Start: 2022-04-22 — End: 2022-04-22

## 2022-04-22 MED ORDER — PHENYLEPHRINE 80 MCG/ML (10ML) SYRINGE FOR IV PUSH (FOR BLOOD PRESSURE SUPPORT)
PREFILLED_SYRINGE | INTRAVENOUS | Status: DC | PRN
  Administered 2022-04-22 (×3): 40 ug via INTRAVENOUS
  Administered 2022-04-22: 80 ug via INTRAVENOUS

## 2022-04-22 SURGICAL SUPPLY — 59 items
BENZOIN TINCTURE PRP APPL 2/3 (GAUZE/BANDAGES/DRESSINGS) ×1 IMPLANT
CANISTER SUCT 3000ML PPV (MISCELLANEOUS) ×4 IMPLANT
COVER MAYO STAND STRL (DRAPES) ×1 IMPLANT
COVER TABLE BACK 60X90 (DRAPES) ×1 IMPLANT
DERMABOND ADVANCED .7 DNX6 (GAUZE/BANDAGES/DRESSINGS) ×1 IMPLANT
DRAPE WARM FLUID 44X44 (DRAPES) IMPLANT
DRSG OPSITE POSTOP 4X10 (GAUZE/BANDAGES/DRESSINGS) ×3 IMPLANT
DRSG OPSITE POSTOP 4X8 (GAUZE/BANDAGES/DRESSINGS) ×1 IMPLANT
DURAPREP 26ML APPLICATOR (WOUND CARE) ×4 IMPLANT
GAUZE 4X4 16PLY ~~LOC~~+RFID DBL (SPONGE) ×2 IMPLANT
GLOVE BIO SURGEON STRL SZ 6.5 (GLOVE) ×4 IMPLANT
GLOVE ECLIPSE 7.5 STRL STRAW (GLOVE) ×1 IMPLANT
GLOVE SURG SS PI 7.0 STRL IVOR (GLOVE) ×2 IMPLANT
GLOVE SURG UNDER POLY LF SZ7 (GLOVE) ×16 IMPLANT
GOWN STRL REUS W/ TWL LRG LVL3 (GOWN DISPOSABLE) ×6 IMPLANT
GOWN STRL REUS W/TWL LRG LVL3 (GOWN DISPOSABLE)
HEMOSTAT ARISTA ABSORB 3G PWDR (HEMOSTASIS) ×1 IMPLANT
HIBICLENS CHG 4% 4OZ BTL (MISCELLANEOUS) ×5 IMPLANT
KIT TURNOVER KIT B (KITS) ×4 IMPLANT
MANIFOLD NEPTUNE II (INSTRUMENTS) ×1 IMPLANT
NDL MAYO CATGUT SZ4 TPR NDL (NEEDLE) IMPLANT
NEEDLE HYPO 22GX1.5 SAFETY (NEEDLE) IMPLANT
NEEDLE MAYO CATGUT SZ4 (NEEDLE) ×4 IMPLANT
NS IRRIG 1000ML POUR BTL (IV SOLUTION) ×5 IMPLANT
PACK ABDOMINAL GYN (CUSTOM PROCEDURE TRAY) ×3 IMPLANT
PACK LAVH (CUSTOM PROCEDURE TRAY) ×1 IMPLANT
PAD ARMBOARD 7.5X6 YLW CONV (MISCELLANEOUS) ×4 IMPLANT
PAD OB MATERNITY 4.3X12.25 (PERSONAL CARE ITEMS) ×4 IMPLANT
SET CYSTO W/LG BORE CLAMP LF (SET/KITS/TRAYS/PACK) ×2 IMPLANT
SET TUBE SMOKE EVAC HIGH FLOW (TUBING) ×1 IMPLANT
SHEET LAVH (DRAPES) ×3 IMPLANT
SLEEVE Z-THREAD 5X100MM (TROCAR) ×1 IMPLANT
SPECIMEN JAR MEDIUM (MISCELLANEOUS) ×3 IMPLANT
SPIKE FLUID TRANSFER (MISCELLANEOUS) ×1 IMPLANT
SPONGE INTESTINAL PEANUT (DISPOSABLE) ×1 IMPLANT
SPONGE SURGIFOAM ABS GEL 12-7 (HEMOSTASIS) IMPLANT
SPONGE T-LAP 18X18 ~~LOC~~+RFID (SPONGE) ×8 IMPLANT
SPONGE T-LAP 4X18 ~~LOC~~+RFID (SPONGE) ×1 IMPLANT
STAPLER VISISTAT 35W (STAPLE) IMPLANT
STRIP CLOSURE SKIN 1/2X4 (GAUZE/BANDAGES/DRESSINGS) ×4 IMPLANT
SUT CHROMIC 0 CT 1 (SUTURE) ×3 IMPLANT
SUT MNCRL AB 3-0 PS2 18 (SUTURE) ×2 IMPLANT
SUT MON AB 3-0 SH 27 (SUTURE)
SUT MON AB 3-0 SH27 (SUTURE) ×3 IMPLANT
SUT PDS AB 0 CT1 27 (SUTURE) ×3 IMPLANT
SUT PLAIN 2 0 XLH (SUTURE) ×4 IMPLANT
SUT VIC AB 0 CT1 18XCR BRD8 (SUTURE) ×12 IMPLANT
SUT VIC AB 0 CT1 27 (SUTURE) ×4
SUT VIC AB 0 CT1 27XBRD ANBCTR (SUTURE) ×13 IMPLANT
SUT VIC AB 0 CT1 8-18 (SUTURE) ×12
SUT VIC AB 2-0 SH 27 (SUTURE)
SUT VIC AB 2-0 SH 27XBRD (SUTURE) ×3 IMPLANT
SUT VICRYL 0 TIES 12 18 (SUTURE) ×4 IMPLANT
SUT VICRYL 0 UR6 27IN ABS (SUTURE) ×1 IMPLANT
SYR CONTROL 10ML LL (SYRINGE) IMPLANT
TOWEL GREEN STERILE FF (TOWEL DISPOSABLE) ×8 IMPLANT
TRAY FOLEY W/BAG SLVR 14FR (SET/KITS/TRAYS/PACK) ×4 IMPLANT
TROCAR BALLN 12MMX100 BLUNT (TROCAR) ×1 IMPLANT
TROCAR Z-THREAD OPTICAL 5X100M (TROCAR) ×1 IMPLANT

## 2022-04-22 NOTE — Anesthesia Procedure Notes (Signed)
Procedure Name: Intubation Date/Time: 04/22/2022 9:00 AM  Performed by: Hewitt Blade, CRNAPre-anesthesia Checklist: Patient identified, Emergency Drugs available, Suction available and Patient being monitored Patient Re-evaluated:Patient Re-evaluated prior to induction Oxygen Delivery Method: Circle system utilized Preoxygenation: Pre-oxygenation with 100% oxygen Induction Type: IV induction Ventilation: Mask ventilation without difficulty Laryngoscope Size: Mac and 3 Grade View: Grade I Tube type: Oral Tube size: 7.0 mm Number of attempts: 1 Airway Equipment and Method: Stylet Placement Confirmation: ETT inserted through vocal cords under direct vision, positive ETCO2 and breath sounds checked- equal and bilateral Secured at: 21 cm Tube secured with: Tape Dental Injury: Teeth and Oropharynx as per pre-operative assessment

## 2022-04-22 NOTE — Op Note (Signed)
Indications:  Menorrhagia dysmenorrhea  Pre-operative Diagnosis: see above.    Post-operative Diagnosis: same wioth dense adhesions.    Operation:  Diagnostic laparoscopy.  Total abdominal hysterectomy.  Left salpinectomy. cystoscopy  Surgeon: ENIDPOE,UMPNT A   Assistants: Waymon Amato MD  Anesthesia: General endotracheal anesthesia  ASA Class: per anesthesia  Procedure Details  The patient was seen in the Holding Room. The risks, benefits, complications, treatment options, and expected outcomes were discussed with the patient.  The patient concurred with the proposed plan, giving informed consent.  The patient was taken to Operating Room #  6 and the procedure verified as a LAVH, poss  Total abdominal hysterectomy and cystoscopy.   A Time Out was held and the above information confirmed.  After induction of anesthesia, the patient was draped and prepped in the usual sterile manner. Pt was placed in supine position after anesthesia and draped and prepped in the usual sterile manner. Foley catheter was placed. Attention was then turned to the vagina.  The weighted retractor was placed in the vagina.  The anterior lip of the cervix was grasped with a single tooth tenaculum.  The uterus did sound to 9 cm.  The hulka manipulator was placed without  difficulty.    A umbilical incision was made and carried through the subcutaneous tissue to the fascia.the peritoneum was opened.  The hasson was placed.  The abdomen was insufflated with co2 gas.  The camera was placed in the abdomen.  The uterus was  stuck to the abdominal wall.  I could not see the anterior culdesac.  Posterior culdesac was free.   Liver appeared normal.   .  Because of  the dense adhesions and unable to see the anterior culdesac,  I decided to do an abdominal hysterectomy.  The umbilical fascial incision was closed. A pfannensteil incision was made with the knife on the skin 2 cm above the symphysis pubis along a previous incision.     Fascial incision was made and extended bilaterally. The rectus muscles were separated. I could see omentum and carefully entred the cavity.  The uterus was stuck to the anterior abdominal wall  it was freed through sharp LOA.  This took an hour.    The above findings were noted. and bowel was packed away from the surgical site.   The anterior peritoneal reflection was incised and the bladder was dissected off the lower uterine segment. This took about 20 - 30 minutes. The pt had 4 CS in the past and had dense bladder and uterine adhesions.   . The right utero-ovarian ligament and proximal fallopian tube were grasped, cut, and suture ligated with 0-Vicryl. The left utero-ovarian ligament and proximal fallopian tube were grasped, cut and suture ligated with 0-Vicryl. Hemostasis was observed. The  left fallopian tube  Wad  clamped with kelly clamp and removed using metzenbaum scissors.  The mesosalpinx was made hemostatic with 0 vicryl suture.  The clip on the tube was also removed.  The right tube was adherant to the ovay and left in place.   The uterine vessels were skeletonized, then clamped, cut and suture ligated with 0-Vicryl suture. The fundus was removed using the knife to obtain better visualization.   Serial pedicles of the cardinal and utero-sacral ligaments were clamped, cut, and suture ligated with 0-Vicryl. Entrance was made into the vagina and the uterus removed. Vaginal cuff angle sutures were placed incorporating the utero-sacral ligaments for support. The vaginal cuff was then closed with a running  stitch of 0- Vicryl. Lavage was carried out until clear. Hemostasis was observed. There was some oozing from the vaginal cuff which was made hemostatic with figure of 8 o vicryl suture.  Ron Agee  was placed along the cuff as well.    Retractors and all packing was removed from the abdomen. The peritoneum could not be identified.  The muscles were reapproximated.    The fascia was approximated with  running sutures of 0- PDSLavage was again carried out.  Hemostasis was observed. The subcutaneous tissue was reapproximated with o plain in interrupted suture.   The umbilical and pfannesteil skin incisions  Were approximated 4-0 monocryl with subcuticular stitches.  The 59m incision was closed with dermabond. All incisions reinforced with dermabond.    Cystoscopy was done and I could see efflux from both ureters.  The bladder was intact.    Instrument, sponge, and needle counts were correct prior to abdominal closure and at the conclusion of the case.   Findings: Normal appearing uterus.  Dense abdomino- pelvic adhesions.    Normal appearing adnexa B  Estimated Blood Loss:  500 mL         Drains: none         Total IV Fluids: 10040m Urine output  1000 cc clear           Specimens: uterus , cervix and  left fallopian  tube         Implants: none         Complications:  None; patient tolerated the procedure well.         Disposition: PACU - hemodynamically stable.         Condition: stable  Attending Attestation: I was present and scrubbed for the entire procedure.

## 2022-04-22 NOTE — Transfer of Care (Signed)
Immediate Anesthesia Transfer of Care Note  Patient: RONEISHA STERN  Procedure(s) Performed: ATTEMPTED LAPAROSCOPIC ASSISTED VAGINAL HYSTERECTOMY (Abdomen) CYSTOSCOPY (Bladder) HYSTERECTOMY ABDOMINAL WITH BILATERAL SALPINGECTOMY (Abdomen)  Patient Location: PACU  Anesthesia Type:General  Level of Consciousness: awake, alert  and oriented  Airway & Oxygen Therapy: Patient Spontanous Breathing and Patient connected to face mask oxygen  Post-op Assessment: Report given to RN and Post -op Vital signs reviewed and stable  Post vital signs: Reviewed and stable  Last Vitals:  Vitals Value Taken Time  BP 139/72 04/22/22 1237  Temp    Pulse 81 04/22/22 1241  Resp 8 04/22/22 1241  SpO2 95 % 04/22/22 1241  Vitals shown include unvalidated device data.  Last Pain:  Vitals:   04/22/22 0709  TempSrc:   PainSc: 6          Complications: No notable events documented.

## 2022-04-22 NOTE — Anesthesia Postprocedure Evaluation (Signed)
Anesthesia Post Note  Patient: Sarah [name]  Procedure(s) Performed: ATTEMPTED LAPAROSCOPIC ASSISTED VAGINAL HYSTERECTOMY (Abdomen) CYSTOSCOPY (Bladder) HYSTERECTOMY ABDOMINAL WITH BILATERAL SALPINGECTOMY (Abdomen)     Patient location during evaluation: PACU Anesthesia Type: General Level of consciousness: awake and alert Pain management: pain level controlled Vital Signs Assessment: post-procedure vital signs reviewed and stable Respiratory status: spontaneous breathing, nonlabored ventilation and respiratory function stable Cardiovascular status: blood pressure returned to baseline and stable Postop Assessment: no apparent nausea or vomiting Anesthetic complications: no   No notable events documented.  Last Vitals:  Vitals:   04/22/22 1345 04/22/22 1347  BP: 128/80 128/80  Pulse: 87 75  Resp: 13 15  Temp:    SpO2: 100% 100%    Last Pain:  Vitals:   04/22/22 1347  TempSrc:   PainSc: 4                  Lidia Collum

## 2022-04-23 ENCOUNTER — Encounter (HOSPITAL_COMMUNITY): Payer: Self-pay | Admitting: Obstetrics and Gynecology

## 2022-04-23 LAB — CBC
HCT: 28.3 % — ABNORMAL LOW (ref 36.0–46.0)
Hemoglobin: 9.3 g/dL — ABNORMAL LOW (ref 12.0–15.0)
MCH: 30 pg (ref 26.0–34.0)
MCHC: 32.9 g/dL (ref 30.0–36.0)
MCV: 91.3 fL (ref 80.0–100.0)
Platelets: 306 10*3/uL (ref 150–400)
RBC: 3.1 MIL/uL — ABNORMAL LOW (ref 3.87–5.11)
RDW: 13.1 % (ref 11.5–15.5)
WBC: 15.2 10*3/uL — ABNORMAL HIGH (ref 4.0–10.5)
nRBC: 0 % (ref 0.0–0.2)

## 2022-04-23 LAB — BASIC METABOLIC PANEL
Anion gap: 10 (ref 5–15)
BUN: <5 mg/dL — ABNORMAL LOW (ref 6–20)
CO2: 23 mmol/L (ref 22–32)
Calcium: 8.6 mg/dL — ABNORMAL LOW (ref 8.9–10.3)
Chloride: 105 mmol/L (ref 98–111)
Creatinine, Ser: 0.85 mg/dL (ref 0.44–1.00)
GFR, Estimated: >60 mL/min (ref >60)
Glucose, Bld: 145 mg/dL — ABNORMAL HIGH (ref 70–99)
Potassium: 4.1 mmol/L (ref 3.5–5.1)
Sodium: 138 mmol/L (ref 135–145)

## 2022-04-23 MED ORDER — IBUPROFEN 600 MG PO TABS: 600.0000 mg | ORAL_TABLET | Freq: Four times a day (QID) | ORAL | Status: DC | PRN

## 2022-04-23 MED ORDER — IBUPROFEN 600 MG PO TABS
600.0000 mg | ORAL_TABLET | Freq: Three times a day (TID) | ORAL | Status: DC | PRN
  Administered 2022-04-23 – 2022-04-24 (×3): 600 mg via ORAL
  Filled 2022-04-23 (×3): qty 1

## 2022-04-23 MED ORDER — CYCLOBENZAPRINE HCL 5 MG PO TABS: 5.0000 mg | ORAL_TABLET | Freq: Three times a day (TID) | ORAL | 0 refills | Status: AC | PRN

## 2022-04-23 MED ORDER — SIMETHICONE 80 MG PO CHEW: 80.0000 mg | CHEWABLE_TABLET | Freq: Four times a day (QID) | ORAL | 0 refills | Status: AC | PRN

## 2022-04-23 MED ORDER — IBUPROFEN 600 MG PO TABS: 600.0000 mg | ORAL_TABLET | Freq: Three times a day (TID) | ORAL | 0 refills | Status: AC | PRN

## 2022-04-23 MED ORDER — OXYCODONE-ACETAMINOPHEN 5-325 MG PO TABS: 1.0000 | ORAL_TABLET | ORAL | 0 refills | Status: AC | PRN

## 2022-04-23 MED ORDER — ONDANSETRON HCL 4 MG PO TABS: 4.0000 mg | ORAL_TABLET | Freq: Four times a day (QID) | ORAL | 0 refills | Status: AC | PRN

## 2022-04-23 MED ORDER — METOCLOPRAMIDE HCL 5 MG PO TABS
10.0000 mg | ORAL_TABLET | Freq: Three times a day (TID) | ORAL | Status: DC
  Administered 2022-04-23 (×2): 10 mg via ORAL
  Filled 2022-04-23 (×2): qty 2

## 2022-04-23 MED ORDER — METOCLOPRAMIDE HCL 10 MG PO TABS: 10.0000 mg | ORAL_TABLET | Freq: Three times a day (TID) | ORAL | 0 refills | Status: AC

## 2022-04-23 MED ORDER — CYCLOBENZAPRINE HCL 5 MG PO TABS
5.0000 mg | ORAL_TABLET | Freq: Three times a day (TID) | ORAL | Status: DC | PRN
  Administered 2022-04-23 – 2022-04-24 (×3): 5 mg via ORAL
  Filled 2022-04-23 (×3): qty 1

## 2022-04-23 MED ORDER — DOCUSATE SODIUM 100 MG PO CAPS: 100.0000 mg | ORAL_CAPSULE | Freq: Two times a day (BID) | ORAL | 0 refills | Status: AC

## 2022-04-23 NOTE — TOC CM/SW Note (Signed)
  Transition of Care (TOC) Screening Note   Patient Details  Name: Sarah [name] Date of Birth: [birth year]     Transition of Care Department [facility]) has reviewed patient and no TOC needs have been identified at this time. We will continue to monitor patient advancement through interdisciplinary progression rounds. If new patient transition needs arise, please place a TOC consult.

## 2022-04-23 NOTE — Progress Notes (Signed)
1 Day Post-Op Procedure(s) (LRB): ATTEMPTED LAPAROSCOPIC ASSISTED VAGINAL HYSTERECTOMY (N/A) CYSTOSCOPY (N/A) HYSTERECTOMY ABDOMINAL WITH BILATERAL SALPINGECTOMY  Subjective: Patient reports incisional pain and tolerating PO.    Objective: I have reviewed patient's vital signs, intake and output, and labs.  General: alert and cooperative Resp: clear to auscultation bilaterally Cardio: regular rate and rhythm GI: soft, non-tender; bowel sounds normal; no masses,  no organomegaly Vaginal Bleeding: minimal  Assessment: s/p Procedure(s): ATTEMPTED LAPAROSCOPIC ASSISTED VAGINAL HYSTERECTOMY (N/A) CYSTOSCOPY (N/A) HYSTERECTOMY ABDOMINAL WITH BILATERAL SALPINGECTOMY: stable, progressing well, and will give po pain meds.  She feels muscle spasms will give flexeril  Plan: Advance diet Encourage ambulation Discontinue IV fluids DC PCA AND GIVE PO PAIN MEDS  LOS: 1 day    Betsy Coder, MD 04/23/2022, 9:08 AM

## 2022-04-24 MED ORDER — CYCLOBENZAPRINE HCL 10 MG PO TABS: 10.0000 mg | ORAL_TABLET | Freq: Three times a day (TID) | ORAL | 0 refills | Status: AC | PRN

## 2022-04-24 NOTE — Discharge Summary (Signed)
Physician Discharge Summary  Patient ID: Sarah [name] MRN: [id number] DOB/AGE: June 15, 1981 41 y.o.  Admit date: 04/22/2022 Discharge date: 04/24/2022  Admission Diagnoses:  Discharge Diagnoses:  Principal Problem:   Excessive and frequent menstruation Active Problems:   S/P TAH (total abdominal hysterectomy)   Discharged Condition: good  Hospital Course: pt underwent a TAH due to uterus stuck to the absominal wall.  She has a normal post op course and was discharged in good condition.  Consults: None  Significant Diagnostic Studies:   Treatments: IV hydration and surgery: as above  Discharge Exam: Blood pressure 119/74, pulse 84, temperature 98.2 F (36.8 C), temperature source Oral, resp. rate 18, height '5\' 5"'$  (1.651 m), weight 94.3 kg, last menstrual period 04/08/2022, SpO2 100 %, unknown if currently breastfeeding. General appearance: alert and cooperative Head: Normocephalic, without obvious abnormality, atraumatic Chest wall: no tenderness Cardio: S1, S2 normal Pelvic: scant VB Incision/Wound: CDI  Disposition: Discharge disposition: 01-Home or Self Care        Allergies as of 04/24/2022   No Known Allergies      Medication List     TAKE these medications    Armodafinil 50 MG tablet Take 2 tablets (100 mg total) by mouth daily. In the AM What changed:  how much to take when to take this additional instructions   Biotin 5000 5 MG Caps Generic drug: Biotin Take 5 mg by mouth daily.   cyclobenzaprine 5 MG tablet Commonly known as: FLEXERIL Take 1 tablet (5 mg total) by mouth 3 (three) times daily as needed for muscle spasms.   cyclobenzaprine 10 MG tablet Commonly known as: FLEXERIL Take 1 tablet (10 mg total) by mouth 3 (three) times daily as needed for muscle spasms.   docusate sodium 100 MG capsule Commonly known as: COLACE Take 1 capsule (100 mg total) by mouth 2 (two) times daily.   ferrous sulfate 325 (65 FE) MG tablet Take 195 mg  by mouth daily with breakfast. Three tabs   ibuprofen 600 MG tablet Commonly known as: ADVIL Take 1 tablet (600 mg total) by mouth every 8 (eight) hours as needed for mild pain.   Lysine 1000 MG Tabs Take 1,000 mg by mouth daily.   metoCLOPramide 10 MG tablet Commonly known as: REGLAN Take 1 tablet (10 mg total) by mouth 4 (four) times daily -  before meals and at bedtime for 7 days.   ocrelizumab 600 mg in sodium chloride 0.9 % 500 mL Inject 600 mg into the vein every 6 (six) months. Ocevus   ondansetron 4 MG tablet Commonly known as: ZOFRAN Take 1 tablet (4 mg total) by mouth every 6 (six) hours as needed for nausea.   oxyCODONE-acetaminophen 5-325 MG tablet Commonly known as: PERCOCET/ROXICET Take 1-2 tablets by mouth every 4 (four) hours as needed for moderate pain.   Restora Caps Take 1 capsule by mouth daily.   simethicone 80 MG chewable tablet Commonly known as: MYLICON Chew 1 tablet (80 mg total) by mouth 4 (four) times daily as needed for flatulence.   SUMAtriptan 100 MG tablet Commonly known as: IMITREX Take 1 tablet (100 mg total) by mouth once as needed for up to 1 dose for migraine. May repeat in 2 hours if headache persists or recurs.   traZODone 100 MG tablet Commonly known as: DESYREL Take 1 tablet (100 mg total) by mouth at bedtime as needed for sleep.   venlafaxine XR 75 MG 24 hr capsule Commonly known as: Effexor XR Take 1  capsule (75 mg total) by mouth daily with breakfast.   Vitamin D (Ergocalciferol) 1.25 MG (50000 UNIT) Caps capsule Commonly known as: DRISDOL Take 1 capsule (50,000 Units total) by mouth 2 (two) times a week.   Vitamin D-3 125 MCG (5000 UT) Tabs Take 5,000 Units by mouth daily.        Follow-up Information     Holden Maniscalco, MD Follow up in 1 week(s).   Specialty: Obstetrics and Gynecology Why: Please keep your previously scheduled post-operative visit with Dr. Charlesetta Garibaldi. Contact information: [address] [place] [id number] [phone]                 Signed: Drema Dallas 04/24/2022, 7:31 AM

## 2022-04-24 NOTE — Progress Notes (Signed)
GYN Post-Op Note  Subjective:  Patient is doing well. Pain is well-controlled. States Flexeril works very well and desires this upon discharge. Tolerating regular diet. Has ambulated and is voiding without issue. fevers, chills, chest pain, SOB, N/V, or bilateral LE edema.  Objective: BP 119/74 (BP Location: Left Arm)   Pulse 84   Temp 98.2 F (36.8 C) (Oral)   Resp 18   Ht '5\' 5"'$  (1.651 m)   Wt 94.3 kg   LMP 04/08/2022   SpO2 100%   BMI 34.61 kg/m  Gen:  NAD, pleasant and cooperative Resp: No increased work of breathing Abd:  Normoactive bowel sounds, soft, non-distended, appropriately tender throughout, no rebound/guarding Ext:  No bilateral LE edema, no bilateral calf tenderness     Latest Ref Rng & Units 04/23/2022   12:36 AM 04/15/2022    9:11 AM 12/09/2021   10:02 AM  CBC  WBC 4.0 - 10.5 K/uL 15.2  6.9  6.0   Hemoglobin 12.0 - 15.0 g/dL 9.3  11.4  10.5   Hematocrit 36.0 - 46.0 % 28.3  34.9  32.0   Platelets 150 - 400 K/uL 306  316  364       Latest Ref Rng & Units 04/23/2022   12:36 AM 04/15/2022    9:11 AM 12/09/2021   10:02 AM  BMP  Glucose 70 - 99 mg/dL 145  100  84   BUN 6 - 20 mg/dL '5  7  10   '$ Creatinine 0.44 - 1.00 mg/dL 0.85  0.76  0.76   BUN/Creat Ratio 9 - 23   13   Sodium 135 - 145 mmol/L 138  140  139   Potassium 3.5 - 5.1 mmol/L 4.1  4.0  4.3   Chloride 98 - 111 mmol/L 105  106  101   CO2 22 - 32 mmol/L '23  26  23   '$ Calcium 8.9 - 10.3 mg/dL 8.6  9.2  9.2      A/P: POD#2 s/p Diagnostic Laparoscopy --> TAH with left salpingectomy and cystoscopy.  - Doing well post-operatively - Pulm:  RA - GI:  Regular diet - Renal:  Voiding spontaneously - IVF:  SLIV - Pain:  Ibuprofen, Percocet, Flexeril - DVT Prophylaxis:  SCDs in bed - Activity:  Ad lib - Labs:  As above - Instructions regarding honeycomb dressing provided   Disposition: D/C home today.  Drema Dallas, DO

## 2022-04-26 LAB — SURGICAL PATHOLOGY

## 2022-06-14 ENCOUNTER — Ambulatory Visit (INDEPENDENT_AMBULATORY_CARE_PROVIDER_SITE_OTHER): Payer: No Typology Code available for payment source | Admitting: Nurse Practitioner

## 2022-06-14 ENCOUNTER — Encounter: Payer: Self-pay | Admitting: Nurse Practitioner

## 2022-06-14 VITALS — BP 110/60 | HR 83 | Temp 98.0°F | Ht 65.0 in | Wt 209.0 lb

## 2022-06-14 DIAGNOSIS — G35 Multiple sclerosis: Secondary | ICD-10-CM

## 2022-06-14 DIAGNOSIS — E559 Vitamin D deficiency, unspecified: Secondary | ICD-10-CM

## 2022-06-14 DIAGNOSIS — Z90710 Acquired absence of both cervix and uterus: Secondary | ICD-10-CM

## 2022-06-14 DIAGNOSIS — E781 Pure hyperglyceridemia: Secondary | ICD-10-CM | POA: Diagnosis not present

## 2022-06-14 DIAGNOSIS — Z6834 Body mass index (BMI) 34.0-34.9, adult: Secondary | ICD-10-CM

## 2022-06-14 DIAGNOSIS — F3289 Other specified depressive episodes: Secondary | ICD-10-CM | POA: Diagnosis not present

## 2022-06-14 DIAGNOSIS — E6609 Other obesity due to excess calories: Secondary | ICD-10-CM

## 2022-06-14 NOTE — Patient Instructions (Signed)
Dyslipidemia Dyslipidemia is an imbalance of waxy, fat-like substances (lipids) in the blood. The body needs lipids in small amounts. Dyslipidemia often involves a high level of cholesterol or triglycerides, which are types of lipids. Common forms of dyslipidemia include: High levels of LDL cholesterol. LDL is the type of cholesterol that causes fatty deposits (plaques) to build up in the blood vessels that carry blood away from the heart (arteries). Low levels of HDL cholesterol. HDL cholesterol is the type of cholesterol that protects against heart disease. High levels of HDL remove the LDL buildup from arteries. High levels of triglycerides. Triglycerides are a fatty substance in the blood that is linked to a buildup of plaques in the arteries. What are the causes? There are two main types of dyslipidemia: primary and secondary. Primary dyslipidemia is caused by changes (mutations) in genes that are passed down through families (inherited). These mutations cause several types of dyslipidemia. Secondary dyslipidemia may be caused by various risk factors that can lead to the disease, such as lifestyle choices and certain medical conditions. What increases the risk? You are more likely to develop this condition if you are an older man or if you are a woman who has gone through menopause. Other risk factors include: Having a family history of dyslipidemia. Taking certain medicines, including birth control pills, steroids, some diuretics, and beta-blockers. Eating a diet high in saturated fat. Smoking cigarettes or excessive alcohol intake. Having certain medical conditions such as diabetes, polycystic ovary syndrome (PCOS), kidney disease, liver disease, or hypothyroidism. Not exercising regularly. Being overweight or obese with too much belly fat. What are the signs or symptoms? In most cases, dyslipidemia does not usually cause any symptoms. In severe cases, very high lipid levels can  cause: Fatty bumps under the skin (xanthomas). A white or gray ring around the black center (pupil) of the eye. Very high triglyceride levels can cause inflammation of the pancreas (pancreatitis). How is this diagnosed? Your health care provider may diagnose dyslipidemia based on a routine blood test (fasting blood test). Because most people do not have symptoms of the condition, this blood testing (lipid profile) is done on adults age 20 and older and is repeated every 4-6 years. This test checks: Total cholesterol. This measures the total amount of cholesterol in your blood, including LDL cholesterol, HDL cholesterol, and triglycerides. A healthy number is below 200 mg/dL (5.17 mmol/L). LDL cholesterol. The target number for LDL cholesterol is different for each person, depending on individual risk factors. A healthy number is usually below 100 mg/dL (2.59 mmol/L). Ask your health care provider what your LDL cholesterol should be. HDL cholesterol. An HDL level of 60 mg/dL (1.55 mmol/L) or higher is best because it helps to protect against heart disease. A number below 40 mg/dL (1.03 mmol/L) for men or below 50 mg/dL (1.29 mmol/L) for women increases the risk for heart disease. Triglycerides. A healthy triglyceride number is below 150 mg/dL (1.69 mmol/L). If your lipid profile is abnormal, your health care provider may do other blood tests. How is this treated? Treatment depends on the type of dyslipidemia that you have and your other risk factors for heart disease and stroke. Your health care provider will have a target range for your lipid levels based on this information. Treatment for dyslipidemia starts with lifestyle changes, such as diet and exercise. Your health care provider may recommend that you: Get regular exercise. Make changes to your diet. Quit smoking if you smoke. Limit your alcohol intake. If diet   changes and exercise do not help you reach your goals, your health care provider  may also prescribe medicine to lower lipids. The most commonly prescribed type of medicine lowers your LDL cholesterol (statin drug). If you have a high triglyceride level, your provider may prescribe another type of drug (fibrate) or an omega-3 fish oil supplement, or both. Follow these instructions at home: Eating and drinking  Follow instructions from your health care provider or dietitian about eating or drinking restrictions. Eat a healthy diet as told by your health care provider. This can help you reach and maintain a healthy weight, lower your LDL cholesterol, and raise your HDL cholesterol. This may include: Limiting your calories, if you are overweight. Eating more fruits, vegetables, whole grains, fish, and lean meats. Limiting saturated fat, trans fat, and cholesterol. Do not drink alcohol if: Your health care provider tells you not to drink. You are pregnant, may be pregnant, or are planning to become pregnant. If you drink alcohol: Limit how much you have to: 0-1 drink a day for women. 0-2 drinks a day for men. Know how much alcohol is in your drink. In the [place] one drink equals one 12 oz bottle of beer (355 mL), one 5 oz glass of wine (148 mL), or one 1 oz glass of hard liquor (44 mL). Activity Get regular exercise. Start an exercise and strength training program as told by your health care provider. Ask your health care provider what activities are safe for you. Your health care provider may recommend: 30 minutes of aerobic activity 4-6 days a week. Brisk walking is an example of aerobic activity. Strength training 2 days a week. General instructions Do not use any products that contain nicotine or tobacco. These products include cigarettes, chewing tobacco, and vaping devices, such as e-cigarettes. If you need help quitting, ask your health care provider. Take over-the-counter and prescription medicines only as told by your health care provider. This includes  supplements. Keep all follow-up visits. This is important. Contact a health care provider if: You are having trouble sticking to your exercise or diet plan. You are struggling to quit smoking or to control your use of alcohol. Summary Dyslipidemia often involves a high level of cholesterol or triglycerides, which are types of lipids. Treatment depends on the type of dyslipidemia that you have and your other risk factors for heart disease and stroke. Treatment for dyslipidemia starts with lifestyle changes, such as diet and exercise. Your health care provider may prescribe medicine to lower lipids. This information is not intended to replace advice given to you by your health care provider. Make sure you discuss any questions you have with your health care provider. Document Revised: 02/26/2022 Document Reviewed: 09/29/2020 Elsevier Patient Education  2023 Elsevier Inc.  

## 2022-06-14 NOTE — Progress Notes (Signed)
I,[name],acting as a Education administrator for [employer], FNP.,have documented all relevant documentation on the behalf of [name], FNP,as directed by  [name], FNP while in the presence of [name], [place].  Subjective:     Patient ID: Sarah [name] , female    DOB: [birth year] , 41 y.o.   MRN: [id number]   Chief Complaint  Patient presents with   Hyperlipidemia    HPI  Patient presents for cholesterol check.   She had a full hysterectomy on 04/24/22 with the removal of her cervix, still has her ovaries. She is feeling better. She had a lot of scar tissue from the c-sections. She had a biopsy which was negative. She has MS and no issues - continues to see Dr. Krista Blue at [facility]rology.   She has custody of her nephew since he was a baby. She has 4 other children     Past Medical History:  Diagnosis Date   Anxiety    Depression    Headache    Heart murmur    childhood   History of twin pregnancy in prior pregnancy 09/09/2015   Last C/S.  Done due to IUGR of female twin; she died at 82 months old   Late prenatal care affecting pregnancy in second trimester, antepartum 09/09/2015   Melanoma ([place]) 1996   on head   Multiple sclerosis ([place])    Multiple sclerosis ([place])    Multiple sclerosis affecting pregnancy ([place]) 08/22/2015   Followed by Dr. Ronny Flurry at [facility].  Stopped meds when found out she was pregnant. MFM consult/Dr. Nelda Severe: "Although Ms. Rundle does have bladder incontinence which is a common symptoms of her MS, she thinks this is more related to her pregnancy rather than MS as she is not having any of her other MS symptoms at this time.  I advised Ms. Elling to make an appointment to see her Neurologist Dr.    Previous cesarean delivery, antepartum condition or complication 24/40/1027   Prev C/S x 3   Prior pregnancy with fetal demise 08/22/2015   @ 22 weeks.   Status post repeat low transverse cesarean section 12/20/2015   Supervision of high-risk pregnancy 08/22/2015     Clinic  High Risk - Transfer from [facility] Prenatal Labs  Dating  LMP Blood type: O/Positive/-- (10/14 0000) O pos  Genetic Screen 1 Screen:  neg   AFP:     neg Antibody:Negative (10/14 0000)neg  Anatomic [place] Normal Rubella: Immune (10/14 0000)Immune  GTT  Third trimester: 129 RPR: NON REAC (02/23 0850) NR  Flu vaccine declines HBsAg: Negative (10/14 0000) Neg  TDaP vaccine 10/02/15        Family History  Problem Relation Age of Onset   Diabetes Mother    Diabetes Maternal Uncle    Hypertension Maternal Grandmother      Current Outpatient Medications:    Armodafinil 50 MG tablet, Take 2 tablets (100 mg total) by mouth daily. In the AM (Patient taking differently: Take 50 mg by mouth every morning.), Disp: 60 tablet, Rfl: 3   Biotin (BIOTIN 5000) 5 MG CAPS, Take 5 mg by mouth daily., Disp: , Rfl:    Cholecalciferol (VITAMIN D-3) 125 MCG (5000 UT) TABS, Take 5,000 Units by mouth daily., Disp: , Rfl:    cyclobenzaprine (FLEXERIL) 10 MG tablet, Take 1 tablet (10 mg total) by mouth 3 (three) times daily as needed for muscle spasms., Disp: 90 tablet, Rfl: 0   cyclobenzaprine (FLEXERIL) 5 MG tablet, Take  1 tablet (5 mg total) by mouth 3 (three) times daily as needed for muscle spasms., Disp: 30 tablet, Rfl: 0   docusate sodium (COLACE) 100 MG capsule, Take 1 capsule (100 mg total) by mouth 2 (two) times daily., Disp: 10 capsule, Rfl: 0   ferrous sulfate 325 (65 FE) MG tablet, Take 195 mg by mouth daily with breakfast. Three tabs, Disp: , Rfl:    ibuprofen (ADVIL) 600 MG tablet, Take 1 tablet (600 mg total) by mouth every 8 (eight) hours as needed for mild pain., Disp: 30 tablet, Rfl: 0   Lysine 1000 MG TABS, Take 1,000 mg by mouth daily., Disp: , Rfl:    ocrelizumab 600 mg in sodium chloride 0.9 % 500 mL, Inject 600 mg into the vein every 6 (six) months. [place]us, Disp: , Rfl:    ondansetron (ZOFRAN) 4 MG tablet, Take 1 tablet (4 mg total) by mouth every 6 (six) hours as needed for  nausea., Disp: 20 tablet, Rfl: 0   oxyCODONE-acetaminophen (PERCOCET/ROXICET) 5-325 MG tablet, Take 1-2 tablets by mouth every 4 (four) hours as needed for moderate pain., Disp: 30 tablet, Rfl: 0   Probiotic Product (RESTORA) CAPS, Take 1 capsule by mouth daily., Disp: 30 capsule, Rfl: 3   simethicone (MYLICON) 80 MG chewable tablet, Chew 1 tablet (80 mg total) by mouth 4 (four) times daily as needed for flatulence., Disp: 30 tablet, Rfl: 0   SUMAtriptan (IMITREX) 100 MG tablet, Take 1 tablet (100 mg total) by mouth once as needed for up to 1 dose for migraine. May repeat in 2 hours if headache persists or recurs., Disp: 30 tablet, Rfl: 1   traZODone (DESYREL) 100 MG tablet, Take 1 tablet (100 mg total) by mouth at bedtime as needed for sleep., Disp: 30 tablet, Rfl: 0   venlafaxine XR (EFFEXOR XR) 75 MG 24 hr capsule, Take 1 capsule (75 mg total) by mouth daily with breakfast., Disp: 90 capsule, Rfl: 3   Vitamin D, Ergocalciferol, (DRISDOL) 1.25 MG (50000 UNIT) CAPS capsule, Take 1 capsule (50,000 Units total) by mouth 2 (two) times a week., Disp: 24 capsule, Rfl: 1   metoCLOPramide (REGLAN) 10 MG tablet, Take 1 tablet (10 mg total) by mouth 4 (four) times daily -  before meals and at bedtime for 7 days., Disp: 28 tablet, Rfl: 0   No Known Allergies   Review of Systems  Constitutional: Negative.   Respiratory: Negative.    Cardiovascular: Negative.   Gastrointestinal: Negative.   Neurological: Negative.   Psychiatric/Behavioral: Negative.       Today's Vitals   06/14/22 0931  BP: 110/60  Pulse: 83  Temp: 98 F (36.7 C)  TempSrc: Oral  Weight: 209 lb (94.8 kg)  Height: '5\' 5"'$  (1.651 m)   Body mass index is 34.78 kg/m.  Wt Readings from Last 3 Encounters:  06/14/22 209 lb (94.8 kg)  04/22/22 208 lb (94.3 kg)  04/15/22 208 lb 6.4 oz (94.5 kg)    Objective:  Physical Exam Vitals reviewed.  Constitutional:      General: She is not in acute distress.    Appearance: Normal  appearance. She is well-developed. She is obese.  Cardiovascular:     Rate and Rhythm: Normal rate and regular rhythm.     Pulses: Normal pulses.     Heart sounds: Normal heart sounds. No murmur heard. Pulmonary:     Effort: Pulmonary effort is normal. No respiratory distress.     Breath sounds: Normal breath sounds.  Chest:  Chest wall: No tenderness.  Musculoskeletal:        General: Normal range of motion.  Skin:    General: Skin is warm and dry.     Capillary Refill: Capillary refill takes less than 2 seconds.  Neurological:     General: No focal deficit present.     Mental Status: She is alert and oriented to person, place, and time.     Cranial Nerves: No cranial nerve deficit.     Motor: No weakness.  Psychiatric:        Mood and Affect: Mood normal.        Behavior: Behavior normal.        Thought Content: Thought content normal.        Judgment: Judgment normal.         Assessment And Plan:     1. High triglycerides Comments: Levels have been normal x 6 months, will check today if continues to be normal will check once a year. - Lipid panel  2. Other depression Comments: Stable. She is taking Effexor given by her Neurologist.  3. Vitamin D deficiency Will check vitamin D level and supplement as needed.    Also encouraged to spend 15 minutes in the sun daily.   4. Multiple sclerosis ([place]) Comments: Followed by Neurology  5. Class 1 obesity due to excess calories without serious comorbidity with body mass index (BMI) of 34.0 to 34.9 in adult Comments: Recently cleared to go back to exercising and work.   She is encouraged to strive for BMI less than 30 to decrease cardiac risk. Advised to aim for at least 150 minutes of exercise per week.  6. History of hysterectomy Has completed f/u with Dr. Charlesetta Garibaldi. She had her cervix removed and still has her ovaries   Patient was given opportunity to ask questions. Patient verbalized understanding of the plan and was  able to repeat key elements of the plan. All questions were answered to their satisfaction.  [name], FNP   I, [name], FNP, have reviewed all documentation for this visit. The documentation on 06/14/22 for the exam, diagnosis, procedures, and orders are all accurate and complete.   IF YOU HAVE BEEN REFERRED TO A SPECIALIST, IT MAY TAKE 1-2 WEEKS TO SCHEDULE/PROCESS THE REFERRAL. IF YOU HAVE NOT HEARD FROM US/SPECIALIST IN TWO WEEKS, PLEASE GIVE [place] A CALL AT [phone] X [phone].   THE PATIENT IS ENCOURAGED TO PRACTICE SOCIAL DISTANCING DUE TO THE COVID-19 PANDEMIC.

## 2022-06-15 LAB — LIPID PANEL
Chol/HDL Ratio: 3.2 ratio (ref 0.0–4.4)
Cholesterol, Total: 146 mg/dL (ref 100–199)
HDL: 45 mg/dL (ref >39)
LDL Chol Calc (NIH): 86 mg/dL (ref 0–99)
Triglycerides: 80 mg/dL (ref 0–149)
VLDL Cholesterol Cal: 15 mg/dL (ref 5–40)

## 2022-08-13 ENCOUNTER — Other Ambulatory Visit: Payer: Self-pay | Admitting: Nurse Practitioner

## 2022-08-13 DIAGNOSIS — Z1231 Encounter for screening mammogram for malignant neoplasm of breast: Secondary | ICD-10-CM

## 2022-10-07 ENCOUNTER — Ambulatory Visit: Payer: No Typology Code available for payment source | Admitting: Neurology

## 2022-11-08 ENCOUNTER — Telehealth: Payer: Self-pay

## 2022-11-08 NOTE — Telephone Encounter (Signed)
MyChart message sent to patient to update phone number.

## 2022-11-10 NOTE — Telephone Encounter (Signed)
Attempted to call patient but mailbox belongs to a Mickel Baas not pt

## 2022-12-14 ENCOUNTER — Encounter: Payer: No Typology Code available for payment source | Admitting: Nurse Practitioner

## 2022-12-21 ENCOUNTER — Encounter: Payer: Self-pay | Admitting: Neurology

## 2022-12-21 ENCOUNTER — Ambulatory Visit (INDEPENDENT_AMBULATORY_CARE_PROVIDER_SITE_OTHER): Payer: Self-pay | Admitting: Neurology

## 2022-12-21 VITALS — BP 130/86 | Ht 65.0 in | Wt 208.0 lb

## 2022-12-21 DIAGNOSIS — R269 Unspecified abnormalities of gait and mobility: Secondary | ICD-10-CM | POA: Diagnosis not present

## 2022-12-21 DIAGNOSIS — F32A Depression, unspecified: Secondary | ICD-10-CM | POA: Diagnosis not present

## 2022-12-21 DIAGNOSIS — G35 Multiple sclerosis: Secondary | ICD-10-CM

## 2022-12-21 DIAGNOSIS — G43709 Chronic migraine without aura, not intractable, without status migrainosus: Secondary | ICD-10-CM | POA: Diagnosis not present

## 2022-12-21 MED ORDER — TRAZODONE HCL 100 MG PO TABS: 100.0000 mg | ORAL_TABLET | Freq: Every day | ORAL | 11 refills | Status: AC

## 2022-12-21 MED ORDER — VENLAFAXINE HCL ER 75 MG PO CP24: 150.0000 mg | ORAL_CAPSULE | Freq: Every day | ORAL | 3 refills | Status: AC

## 2022-12-21 NOTE — Progress Notes (Signed)
Chief Complaint  Patient presents with   Follow-up    Rm 14, alone, headaches/migraines are worse, now is taking the second dose along with excedrin migraine. Having hard time with memory and forming sentences. Gait is off more      ASSESSMENT AND PLAN  Sarah [name] is a 42 y.o. female   Relapsing remitting multiple sclerosis  Stable since starting ocrelizumab in 2017, tolerating it well,  IgG IgM within normal limit  Repeat MRI of the brain in June 2022 showed no new lesions, stable  Lab today  Depression anxiety, significant social stress Chronic migraine Higher dose of Effexor XR 150 mg every morning for depression and migraine prevention  Trazodone 100mg  every night for sleep  Imitrex 100mg  as needed for migraine, May combine with Aleve.  Return To Clinic With NP In 6 Months   DIAGNOSTIC DATA (LABS, IMAGING, TESTING) - I reviewed patient records, labs, notes, testing and imaging myself where available.  MRI of brain w/wo on February 02 2021:  1.   Multiple T2/FLAIR hyperintense foci in the hemispheres and a couple small foci in the pons in a pattern consistent with chronic demyelinating plaque consistent with multiple sclerosis.  None of the foci appear to be acute.  They do not enhance.  Compared to the MRI dated 05/02/2017, there are no new lesions. 2.   Chronic pansinusitis as described above. 3.   No acute findings.  Normal enhancement pattern.  MRI brain and cervical in August 2017 1. Several new enhancing lesions in periventricular and subcortical white matter as well as the splenium of corpus callosum with single diffusion restricting lesion and left frontal subcortical white matter in comparison with the prior MRI of the brain consistent with active demyelination. 2. New enhancing cord lesion at C4 in the dorsal column consistent with active demyelination. 3. Several additional stable supratentorial, infratentorial, and cervical demyelinating plaques again  noted. 4. Probable atrophy of the optic nerves bilaterally and question of increased T2 signal in the right optic nerve which may represent sequelae of demyelination.   Laboratory evaluations in July 2022, Ig G, Ig M within normal limit, Ig A was mildly elevated 464, normal CMP, creat 0.68,  CBC, Hg 11.0. Ferritin 27, lipid panel LDL 90.  MEDICAL HISTORY:   She had a history of relapsing remitting multiple sclerosis, the diagnosis was made in 2002, she presented with right optic neuritis, initially was treated at [facility] with steroid, later Betaseron, she did very well but she had a stillborn at week 22 while using Betaseron before and during pregnancy, she later had more pregnant first born in December 2004, second born August 2006, third child in April 2009 with twins, Betaseron was on hold during her pregnancy.   She had a major flareup in summer of 2007, she had 4 extremity paresthesia, weakness, difficulty walking, she was again put on steroid and Betaseron transiently, she could not tolerate the injection due to flulike illness, nausea or vomiting, and the cost factor. Betaseron was stopped,   Third major flareup was in January 2011, she had significant weakness, fall and paresthesia, difficulty walking driving for 2 months, Silvestre Moment was discussed with her at that time, but because worried the side effect of PML, it was not started,   She was not on any treatment between 2011 and 2014.   Another flareup was in February 2014, she had right eye pain, blurry vision, right arm and leg weakness, increased gait abnormality, constipation, bladder incontinence, somewhat responsive  to steroid,   MRI of the brain in 2015 showed multiple bilateral periventricular and subcortical MS lesions, MRI of cervical spine also demonstrate increased T2 at cervical cord at C2-3, C5, left-sided lesion at C7. MRI of the thoracic spine showed diffuse cord abnormality at T1-T9, focal enhancement at ventral surface of the  cord at T5-6, extending cephalocaudal fourth 12 mm,   JC virus antibody was negative with titer of 0.19 in May 2015, she was enrolled in research study was treated with Copaxone in April 2014, later she was rolled over to Gilenya since early 2014,    Her gait difficulty has much improved with Gilenya treatment,, but still with baseline bilateral lower extremity spasticity and paresthesia, last office visit was in May 2015, she has developed lower back cellulitis in April 2015, she was treated with antibiotics, she has stopped Gilenya for about 2 weeks, then had another flareup of aggressive worsening gait abnormality, right arm and leg weakness, paresthesia, also progressive worsening bilateral visual difficulty, severely restricted visual field, decreased visual acuity. During last office visit in May 2015, She was found to have severely restricted bilateral visual field, decreased visual acuity, counting fingers with right eye, left eye could only read large print, mild spastic quadriplegia, right worse than left, with motor strength of 4/5, ambulate with a wide based unsteady gait   She was sent to hospital admission in May 2015, her symptoms has much improved with high-dose of IV steroid followed by physical therapy, she has lost follow-ups since. 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 June 2015     She was pregnant since September 2016,  had elective C-section on Dec 20 2015, with healthy girl   She presented to emergency room on March 24 2016 for  worsening bilateral hands feet numbness, unsteady gait, weakness, she started to have worsening paresthesia, bladder urgency, incontinence, increased right eye blurry postpartum 6 week around June 2017, progressively worsened   I have personally reviewed repeat MRI of the brain,  cervical spine with and without contrast in August 2017, in comparison with 2015, new enhancing nations in periventricular and subcortical white matter, splenium of corpus callosum consistent with active demyelinating, new enhancing cord lesion at C4 in the dorsal.   She was enrolled into MS search since Oct 2017, we have reviewed most recent MRI, in comparison to MRI of the brain with and without contrast in August 2017, and 2015, there was increase supratentorium lesion load, there was also 5 foci enhanced, which was not present in MRI of the brain in August 2017.   MRI of the cervical spine in August 2017 showed new enhancing cord lesion at C4,   She started research medication subcutaneous (CD20 monoclonal antibody) every month plus pill (Abulgia).    She reported significant change at the end of April 2018, she developed double vision, dizziness, ataxic gait, she began IV Solu-Medrol infusion 1000 mg daily since Dec 14 2016, complete 3 days today, about 2 weeks after symptoms onset without significant improvement,   She denies signs of infection, reviewed laboratory evaluation showed hemoglobin 10 point 5, creatinine 0.71, cholesterol 156, triglycerides 193, UA on November 15 2016 was negative   UPDATE March 30 2021: She is overall stable, ocrelizumab was started since fall 2017, she works from home, continue has mild baseline gait abnormality, lower extremity spasticity,  She also complains of increased fatigue, chronic migraine headaches about twice a week, mild depression,  UPDATE Dec 21 2022: Patient is very disturbed today, she just lost her job last week, she has 4 children, now have custody of her 60-year-old nephew unexpectedly since 2023, she has to move out of her house in February 2024, now live at her cousin's house that 1 hour 40 minutes drive away  This has caused a lot of anxiety on her, increased gait abnormality, difficulty focusing, mental slowing, not sleeping well,    She  has been able to tolerating ocrelizumab infusion well, last infusion was in April 2024,    PHYSICAL EXAM:   Vitals:   12/21/22 1034  BP: 130/86  Weight: 208 lb (94.3 kg)  Height: 5\' 5"  (1.651 m)   Body mass index is 34.61 kg/m.  PHYSICAL EXAMNIATION:  Gen: NAD, conversant, well nourised, well groomed           NEUROLOGICAL EXAM:  MENTAL STATUS: Speech/cognition: Anxious looking young female, awake, alert,   CRANIAL NERVES: CN II: Visual fields are full to confrontation. Pupils are round equal and briskly reactive to light. OD 20/70, OS 20/20 CN III, IV, VI: extraocular movement are normal. No ptosis. CN V: Facial sensation is intact to light touch CN VII: Face is symmetric with normal eye closure  CN VIII: Hearing is normal to causal conversation. CN IX, X: Phonation is normal. CN XI: Head turning and shoulder shrug are intact  MOTOR: Moderate spasticity of lower extremity, no significant muscle weakness  REFLEXES: Reflexes are 2+ and symmetric at the biceps, triceps, knees, and ankles. Plantar responses are flexor.  SENSORY: Intact to light touch, pinprick and vibratory sensation are intact in fingers and toes.  COORDINATION: There is no trunk or limb dysmetria noted.  GAIT/STANCE: She needs push-up to get up from seated position, cautious, mildly unsteady gait  REVIEW OF SYSTEMS:  Full 14 system review of systems performed and notable only for as above All other review of systems were negative.   ALLERGIES: No Known Allergies  HOME MEDICATIONS: Current Outpatient Medications  Medication Sig Dispense Refill   Armodafinil 50 MG tablet Take 2 tablets (100 mg total) by mouth daily. In the AM (Patient taking differently: Take 50 mg by mouth every morning.) 60 tablet 3   Biotin (BIOTIN 5000) 5 MG CAPS Take 5 mg by mouth daily.     Cholecalciferol (VITAMIN D-3) 125 MCG (5000 UT) TABS Take 5,000 Units by mouth daily.     cyclobenzaprine (FLEXERIL) 10 MG tablet  Take 1 tablet (10 mg total) by mouth 3 (three) times daily as needed for muscle spasms. 90 tablet 0   ferrous sulfate 325 (65 FE) MG tablet Take 195 mg by mouth daily with breakfast. Three tabs     Lysine 1000 MG TABS Take 1,000 mg by mouth daily.     ocrelizumab 600 mg in sodium chloride 0.9 % 500 mL Inject 600 mg into the vein every 6 (six) months. Ocevus     ondansetron (ZOFRAN) 4 MG tablet Take 1 tablet (4 mg total) by mouth every 6 (six) hours as needed for nausea. 20 tablet 0   Probiotic Product (RESTORA) CAPS Take 1 capsule by mouth daily. 30 capsule 3   SUMAtriptan (IMITREX) 100 MG tablet Take 1 tablet (100 mg total) by mouth once as needed for up to 1 dose for migraine. May repeat in 2 hours if headache persists or recurs. 30 tablet 1  traZODone (DESYREL) 100 MG tablet Take 1 tablet (100 mg total) by mouth at bedtime as needed for sleep. 30 tablet 0   venlafaxine XR (EFFEXOR XR) 75 MG 24 hr capsule Take 1 capsule (75 mg total) by mouth daily with breakfast. 90 capsule 3   Vitamin D, Ergocalciferol, (DRISDOL) 1.25 MG (50000 UNIT) CAPS capsule Take 1 capsule (50,000 Units total) by mouth 2 (two) times a week. 24 capsule 1   cyclobenzaprine (FLEXERIL) 5 MG tablet Take 1 tablet (5 mg total) by mouth 3 (three) times daily as needed for muscle spasms. (Patient not taking: Reported on 12/21/2022) 30 tablet 0   docusate sodium (COLACE) 100 MG capsule Take 1 capsule (100 mg total) by mouth 2 (two) times daily. (Patient taking differently: Take 100 mg by mouth 2 (two) times daily as needed for mild constipation.) 10 capsule 0   ibuprofen (ADVIL) 600 MG tablet Take 1 tablet (600 mg total) by mouth every 8 (eight) hours as needed for mild pain. (Patient not taking: Reported on 12/21/2022) 30 tablet 0   metoCLOPramide (REGLAN) 10 MG tablet Take 1 tablet (10 mg total) by mouth 4 (four) times daily -  before meals and at bedtime for 7 days. 28 tablet 0   oxyCODONE-acetaminophen (PERCOCET/ROXICET) 5-325 MG  tablet Take 1-2 tablets by mouth every 4 (four) hours as needed for moderate pain. (Patient not taking: Reported on 12/21/2022) 30 tablet 0   simethicone (MYLICON) 80 MG chewable tablet Chew 1 tablet (80 mg total) by mouth 4 (four) times daily as needed for flatulence. (Patient not taking: Reported on 12/21/2022) 30 tablet 0   No current facility-administered medications for this visit.    PAST MEDICAL HISTORY: Past Medical History:  Diagnosis Date   Anxiety    Depression    Headache    Heart murmur    childhood   History of twin pregnancy in prior pregnancy 09/09/2015   Last C/S.  Done due to IUGR of female twin; she died at 5 months old   Late prenatal care affecting pregnancy in second trimester, antepartum 09/09/2015   Melanoma (HCC) 1996   on head   Multiple sclerosis (HCC)    Multiple sclerosis (HCC)    Multiple sclerosis affecting pregnancy (HCC) 08/22/2015   Followed by Dr. Threasa Beards at [facility].  Stopped meds when found out she was pregnant. MFM consult/Dr. Otho Perl: "Although Ms. Woolard does have bladder incontinence which is a common symptoms of her MS, she thinks this is more related to her pregnancy rather than MS as she is not having any of her other MS symptoms at this time.  I advised Ms. Ladd to make an appointment to see her Neurologist Dr.    Previous cesarean delivery, antepartum condition or complication 08/22/2015   Prev C/S x 3   Prior pregnancy with fetal demise 08/22/2015   @ 22 weeks.   Status post repeat low transverse cesarean section 12/20/2015   Supervision of high-risk pregnancy 08/22/2015    Clinic  High Risk - Transfer from [facility] Prenatal Labs  Dating  LMP Blood type: O/Positive/-- (10/14 0000) O pos  Genetic Screen 1 Screen:  neg   AFP:     neg Antibody:Negative (10/14 0000)neg  Anatomic [place] Normal Rubella: Immune (10/14 0000)Immune  GTT  Third trimester: 129 RPR: NON REAC (02/23 0850) NR  Flu vaccine declines HBsAg: Negative (10/14 0000)  Neg  TDaP vaccine 10/02/15       PAST SURGICAL HISTORY: Past Surgical History:  Procedure Laterality Date   ABDOMINAL HYSTERECTOMY  04/22/2022   Procedure: HYSTERECTOMY ABDOMINAL WITH BILATERAL SALPINGECTOMY;  Surgeon: Jaymes Graff, MD;  Location: [facility] OR;  Service: Gynecology;;   CESAREAN SECTION     x4   CESAREAN SECTION WITH BILATERAL TUBAL LIGATION N/A 12/20/2015   Procedure: CESAREAN SECTION WITH BILATERAL TUBAL LIGATION;  Surgeon: Willodean Rosenthal, MD;  Location: [facility];  Service: Obstetrics;  Laterality: N/A;   CYSTOSCOPY N/A 04/22/2022   Procedure: CYSTOSCOPY;  Surgeon: Jaymes Graff, MD;  Location: [facility] OR;  Service: Gynecology;  Laterality: N/A;   LAPAROSCOPIC VAGINAL HYSTERECTOMY WITH SALPINGECTOMY N/A 04/22/2022   Procedure: ATTEMPTED LAPAROSCOPIC ASSISTED VAGINAL HYSTERECTOMY;  Surgeon: Jaymes Graff, MD;  Location: [facility] OR;  Service: Gynecology;  Laterality: N/A;   MELANOMA EXCISION  age 31   on head   TUBAL LIGATION  2017    FAMILY HISTORY: Family History  Problem Relation Age of Onset   Diabetes Mother    Diabetes Maternal Uncle    Hypertension Maternal Grandmother     SOCIAL HISTORY: Social History   Socioeconomic History   Marital status: Divorced    Spouse name: Not on file   Number of children: Not on file   Years of education: Not on file   Highest education level: Not on file  Occupational History   Not on file  Tobacco Use   Smoking status: Some Days    Types: Cigars   Smokeless tobacco: Never   Tobacco comments:    Smokes cigars only - one every 3 weeks.   Vaping Use   Vaping Use: Never used  Substance and Sexual Activity   Alcohol use: Yes    Comment: occasionally   Drug use: No   Sexual activity: Not Currently    Birth control/protection: Surgical  Other Topics Concern   Not on file  Social History Narrative   Right handed   Caffeine-8oz daily occasionally   Social Determinants of Health   Financial Resource Strain:  Not on file  Food Insecurity: Not on file  Transportation Needs: Not on file  Physical Activity: Not on file  Stress: Not on file  Social Connections: Not on file  Intimate Partner Violence: Not on file      Levert Feinstein, M.D. Ph.D.  [facility] [address] [place], [place] [address] Ph: [phone] Fax: [phone]  CC:  Arnette Felts, FNP [address] [place],  [place] [address]  Arnette Felts, FNP

## 2022-12-23 LAB — CBC WITH DIFFERENTIAL/PLATELET
Basophils Absolute: 0 10*3/uL (ref 0.0–0.2)
Basos: 0 %
EOS (ABSOLUTE): 0.2 10*3/uL (ref 0.0–0.4)
Eos: 3 %
Hematocrit: 37 % (ref 34.0–46.6)
Hemoglobin: 11.8 g/dL (ref 11.1–15.9)
Immature Grans (Abs): 0 10*3/uL (ref 0.0–0.1)
Immature Granulocytes: 0 %
Lymphocytes Absolute: 1.3 10*3/uL (ref 0.7–3.1)
Lymphs: 17 %
MCH: 29.3 pg (ref 26.6–33.0)
MCHC: 31.9 g/dL (ref 31.5–35.7)
MCV: 92 fL (ref 79–97)
Monocytes Absolute: 0.7 10*3/uL (ref 0.1–0.9)
Monocytes: 10 %
Neutrophils Absolute: 5.2 10*3/uL (ref 1.4–7.0)
Neutrophils: 70 %
Platelets: 325 10*3/uL (ref 150–450)
RBC: 4.03 x10E6/uL (ref 3.77–5.28)
RDW: 12.5 % (ref 11.7–15.4)
WBC: 7.5 10*3/uL (ref 3.4–10.8)

## 2022-12-23 LAB — COMPREHENSIVE METABOLIC PANEL
ALT: 13 IU/L (ref 0–32)
AST: 17 IU/L (ref 0–40)
Albumin/Globulin Ratio: 1.5 (ref 1.2–2.2)
Albumin: 4.3 g/dL (ref 3.9–4.9)
Alkaline Phosphatase: 100 IU/L (ref 44–121)
BUN/Creatinine Ratio: 11 (ref 9–23)
BUN: 8 mg/dL (ref 6–24)
Bilirubin Total: 0.2 mg/dL (ref 0.0–1.2)
CO2: 23 mmol/L (ref 20–29)
Calcium: 9.7 mg/dL (ref 8.7–10.2)
Chloride: 102 mmol/L (ref 96–106)
Creatinine, Ser: 0.73 mg/dL (ref 0.57–1.00)
Globulin, Total: 2.8 g/dL (ref 1.5–4.5)
Glucose: 79 mg/dL (ref 70–99)
Potassium: 4.1 mmol/L (ref 3.5–5.2)
Sodium: 138 mmol/L (ref 134–144)
Total Protein: 7.1 g/dL (ref 6.0–8.5)
eGFR: 107 mL/min/{1.73_m2} (ref >59)

## 2022-12-23 LAB — TSH: TSH: 1.11 u[IU]/mL (ref 0.450–4.500)

## 2022-12-23 LAB — VITAMIN D 25 HYDROXY (VIT D DEFICIENCY, FRACTURES): Vit D, 25-Hydroxy: 28.2 ng/mL — ABNORMAL LOW (ref 30.0–100.0)

## 2022-12-23 LAB — VITAMIN B12: Vitamin B-12: 393 pg/mL (ref 232–1245)

## 2023-03-10 ENCOUNTER — Other Ambulatory Visit: Payer: Self-pay

## 2023-03-10 DIAGNOSIS — G35 Multiple sclerosis: Secondary | ICD-10-CM

## 2023-03-10 DIAGNOSIS — R269 Unspecified abnormalities of gait and mobility: Secondary | ICD-10-CM

## 2023-05-03 ENCOUNTER — Other Ambulatory Visit: Payer: Self-pay

## 2023-05-03 DIAGNOSIS — R269 Unspecified abnormalities of gait and mobility: Secondary | ICD-10-CM

## 2023-05-03 DIAGNOSIS — G35 Multiple sclerosis: Secondary | ICD-10-CM

## 2023-05-11 LAB — IGG, IGA, IGM
IgA/Immunoglobulin A, Serum: 387 mg/dL — ABNORMAL HIGH (ref 87–352)
IgG (Immunoglobin G), Serum: 867 mg/dL (ref 586–1602)
IgM (Immunoglobulin M), Srm: 47 mg/dL (ref 26–217)

## 2023-06-21 ENCOUNTER — Telehealth: Payer: Self-pay | Admitting: Neurology

## 2023-06-21 NOTE — Telephone Encounter (Signed)
Patient had relapsing remitting MS since 2002.  Over the years, she has tried and failed different medication including Betaseron, Copaxone, Gilenya,  She has been doing very well since she was started on ocrelizumab in 2017, no longer have obvious clinical flareup

## 2023-06-22 NOTE — Telephone Encounter (Signed)
Sarah [name] has sent an appeal letter to insurance, pending decision at this time

## 2023-06-28 NOTE — Progress Notes (Addendum)
No chief complaint on file.  Virtual Visit via Video Note  I connected with Sarah [name] on 06/28/23 at  2:30 PM EST by a video enabled telemedicine application and verified that I am speaking with the correct person using two identifiers.  Location: Patient: at her home Provider: in the office    I discussed the limitations of evaluation and management by telemedicine and the availability of in person appointments. The patient expressed understanding and agreed to proceed.  ASSESSMENT AND PLAN  Sarah [name] is a 42 y.o. female   1.  Relapsing remitting multiple sclerosis  Stable since starting ocrelizumab in 2017, overdue for infusion, last infusion April 2024, is pending insurance appeal  I ordered repeat MRI of the brain and cervical spine with and without contrast for MS surveillance, has felt more MS symptoms since being overdue for Ocrevus, significant personal stress  IgG, IgM normal October 2024  Working at [employer], loading trucks, has paperwork for restrictions to complete, she will forward to [place]   2.  Depression anxiety, significant social stress 3.  Chronic migraine  Stopped taking Effexor, trazodone  Discussed could benefit from restarting Effexor, even low-dose 75 mg daily, may hold off on trazodone, has young child she now has custody of  Imitrex 100mg  as needed for migraine, May combine with Aleve.  Follow-up with me in 6 months  Orders Placed This Encounter  Procedures   MR BRAIN W WO CONTRAST   MR CERVICAL SPINE W WO CONTRAST    DIAGNOSTIC DATA (LABS, IMAGING, TESTING) - I reviewed patient records, labs, notes, testing and imaging myself where available.  Addendum, MRI of the cervical spine with without contrast [facility] July 28, 2023 1. Multiple chronic nonenhancing demyelinating plaques in the cervical and upper thoracic spinal cord.  2. No cord contour deformity, canal stenosis, or foraminal narrowin   MRI of brain w/wo 1. There are  numerous supratentorial demyelinating plaques.  2. There are no infratentorial demyelinating plaques in the posterior fossa.  3. There are no enhancing plaques at the present time.  4. There are multiple T1 hypointensities.  5. There is no visible cerebral atrophy.  6. There is no PML.  7. Mild/moderate paranasal sinus mucosal thickening.   MRI of brain w/wo on February 02 2021:  1.   Multiple T2/FLAIR hyperintense foci in the hemispheres and a couple small foci in the pons in a pattern consistent with chronic demyelinating plaque consistent with multiple sclerosis.  None of the foci appear to be acute.  They do not enhance.  Compared to the MRI dated 05/02/2017, there are no new lesions. 2.   Chronic pansinusitis as described above. 3.   No acute findings.  Normal enhancement pattern.  MRI brain and cervical in August 2017 1. Several new enhancing lesions in periventricular and subcortical white matter as well as the splenium of corpus callosum with single diffusion restricting lesion and left frontal subcortical white matter in comparison with the prior MRI of the brain consistent with active demyelination. 2. New enhancing cord lesion at C4 in the dorsal column consistent with active demyelination. 3. Several additional stable supratentorial, infratentorial, and cervical demyelinating plaques again noted. 4. Probable atrophy of the optic nerves bilaterally and question of increased T2 signal in the right optic nerve which may represent sequelae of demyelination.   Laboratory evaluations in July 2022, Ig G, Ig M within normal limit, Ig A was mildly elevated 464, normal CMP, creat 0.68,  CBC, Hg  11.0. Ferritin 27, lipid panel LDL 90.  MEDICAL HISTORY:   She had a history of relapsing remitting multiple sclerosis, the diagnosis was made in 2002, she presented with right optic neuritis, initially was treated at [facility] with steroid, later Betaseron, she did very well but she had a stillborn at  week 22 while using Betaseron before and during pregnancy, she later had more pregnant first born in December 2004, second born August 2006, third child in April 2009 with twins, Betaseron was on hold during her pregnancy.   She had a major flareup in summer of 2007, she had 4 extremity paresthesia, weakness, difficulty walking, she was again put on steroid and Betaseron transiently, she could not tolerate the injection due to flulike illness, nausea or vomiting, and the cost factor. Betaseron was stopped,   Third major flareup was in January 2011, she had significant weakness, fall and paresthesia, difficulty walking driving for 2 months, Silvestre Moment was discussed with her at that time, but because worried the side effect of PML, it was not started,   She was not on any treatment between 2011 and 2014.   Another flareup was in February 2014, she had right eye pain, blurry vision, right arm and leg weakness, increased gait abnormality, constipation, bladder incontinence, somewhat responsive to steroid,   MRI of the brain in 2015 showed multiple bilateral periventricular and subcortical MS lesions, MRI of cervical spine also demonstrate increased T2 at cervical cord at C2-3, C5, left-sided lesion at C7. MRI of the thoracic spine showed diffuse cord abnormality at T1-T9, focal enhancement at ventral surface of the cord at T5-6, extending cephalocaudal fourth 12 mm,   JC virus antibody was negative with titer of 0.19 in May 2015, she was enrolled in research study was treated with Copaxone in April 2014, later she was rolled over to Gilenya since early 2014,    Her gait difficulty has much improved with Gilenya treatment,, but still with baseline bilateral lower extremity spasticity and paresthesia, last office visit was in May 2015, she has developed lower back cellulitis in April 2015, she was treated with antibiotics, she has stopped Gilenya for about 2 weeks, then had another flareup of aggressive  worsening gait abnormality, right arm and leg weakness, paresthesia, also progressive worsening bilateral visual difficulty, severely restricted visual field, decreased visual acuity. During last office visit in May 2015, She was found to have severely restricted bilateral visual field, decreased visual acuity, counting fingers with right eye, left eye could only read large print, mild spastic quadriplegia, right worse than left, with motor strength of 4/5, ambulate with a wide based unsteady gait   She was sent to hospital admission in May 2015, her symptoms has much improved with high-dose of IV steroid followed by physical therapy, she has lost follow-ups since. 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 June 2015     She was pregnant since September 2016,  had elective C-section on Dec 20 2015, with healthy girl   She presented to emergency room on March 24 2016 for  worsening bilateral hands feet numbness, unsteady gait, weakness, she started to have worsening paresthesia, bladder urgency, incontinence, increased right eye blurry postpartum 6 week around June 2017, progressively worsened   I have personally reviewed repeat MRI of the brain, cervical spine with and without contrast in August 2017, in comparison  with 2015, new enhancing nations in periventricular and subcortical white matter, splenium of corpus callosum consistent with active demyelinating, new enhancing cord lesion at C4 in the dorsal.   She was enrolled into MS search since Oct 2017, we have reviewed most recent MRI, in comparison to MRI of the brain with and without contrast in August 2017, and 2015, there was increase supratentorium lesion load, there was also 5 foci enhanced, which was not present in MRI of the brain in August 2017.   MRI of the cervical spine in  August 2017 showed new enhancing cord lesion at C4,   She started research medication subcutaneous (CD20 monoclonal antibody) every month plus pill (Abulgia).    She reported significant change at the end of April 2018, she developed double vision, dizziness, ataxic gait, she began IV Solu-Medrol infusion 1000 mg daily since Dec 14 2016, complete 3 days today, about 2 weeks after symptoms onset without significant improvement,   She denies signs of infection, reviewed laboratory evaluation showed hemoglobin 10 point 5, creatinine 0.71, cholesterol 156, triglycerides 193, UA on November 15 2016 was negative   UPDATE March 30 2021: She is overall stable, ocrelizumab was started since fall 2017, she works from home, continue has mild baseline gait abnormality, lower extremity spasticity,  She also complains of increased fatigue, chronic migraine headaches about twice a week, mild depression,  UPDATE Dec 21 2022: Patient is very disturbed today, she just lost her job last week, she has 4 children, now have custody of her 25-year-old nephew unexpectedly since 2023, she has to move out of her house in February 2024, now live at her cousin's house that 1 hour 40 minutes drive away  This has caused a lot of anxiety on her, increased gait abnormality, difficulty focusing, mental slowing, not sleeping well,    She has been able to tolerating ocrelizumab infusion well, last infusion was in April 2024,  Update June 29, 2023 SS: Via VV, she quit her job, no longer has insurance, did get approved for [employer]. Working now at [employer] loading trucks. Has cognitive fog. Balance is impaired, she sways. Feels general weakness, in her grips bilateral, hard to lift boxes. Vision is fine. Has urinary urgency with accidents, wears adult briefs. A lot of personal stress, anxiety. Moved to [place]. Last Ocrevus was 12/06/22, insurance denied, currently in appeal via intrafusion. Has stopped taking trazodone, Effexor. Has  had worsening depression, anxiety. Works 3rd shift. Has her 1 year nephew.    PHYSICAL EXAM:  Via video visit, is alert and oriented, speech is clear and concise, looks tired.  Moves about freely.  Gait appears steady and intact.  REVIEW OF SYSTEMS:  Full 14 system review of systems performed and notable only for as above All other review of systems were negative.   ALLERGIES: No Known Allergies  HOME MEDICATIONS: Current Outpatient Medications  Medication Sig Dispense Refill   Armodafinil 50 MG tablet Take 2 tablets (100 mg total) by mouth daily. In the AM (Patient taking differently: Take 50 mg by mouth every morning.) 60 tablet 3   Biotin (BIOTIN 5000) 5 MG CAPS Take 5 mg by mouth daily.     Cholecalciferol (VITAMIN D-3) 125 MCG (5000 UT) TABS Take 5,000 Units by mouth daily.     cyclobenzaprine (FLEXERIL) 10 MG tablet Take 1 tablet (10 mg total) by mouth 3 (three) times daily as needed for muscle spasms. 90 tablet 0   cyclobenzaprine (FLEXERIL) 5 MG tablet Take 1 tablet (5  mg total) by mouth 3 (three) times daily as needed for muscle spasms. (Patient not taking: Reported on 12/21/2022) 30 tablet 0   docusate sodium (COLACE) 100 MG capsule Take 1 capsule (100 mg total) by mouth 2 (two) times daily. (Patient taking differently: Take 100 mg by mouth 2 (two) times daily as needed for mild constipation.) 10 capsule 0   ferrous sulfate 325 (65 FE) MG tablet Take 195 mg by mouth daily with breakfast. Three tabs     ibuprofen (ADVIL) 600 MG tablet Take 1 tablet (600 mg total) by mouth every 8 (eight) hours as needed for mild pain. (Patient not taking: Reported on 12/21/2022) 30 tablet 0   Lysine 1000 MG TABS Take 1,000 mg by mouth daily.     metoCLOPramide (REGLAN) 10 MG tablet Take 1 tablet (10 mg total) by mouth 4 (four) times daily -  before meals and at bedtime for 7 days. 28 tablet 0   ocrelizumab 600 mg in sodium chloride 0.9 % 500 mL Inject 600 mg into the vein every 6 (six) months.  Ocevus     ondansetron (ZOFRAN) 4 MG tablet Take 1 tablet (4 mg total) by mouth every 6 (six) hours as needed for nausea. 20 tablet 0   oxyCODONE-acetaminophen (PERCOCET/ROXICET) 5-325 MG tablet Take 1-2 tablets by mouth every 4 (four) hours as needed for moderate pain. (Patient not taking: Reported on 12/21/2022) 30 tablet 0   Probiotic Product (RESTORA) CAPS Take 1 capsule by mouth daily. 30 capsule 3   simethicone (MYLICON) 80 MG chewable tablet Chew 1 tablet (80 mg total) by mouth 4 (four) times daily as needed for flatulence. (Patient not taking: Reported on 12/21/2022) 30 tablet 0   SUMAtriptan (IMITREX) 100 MG tablet Take 1 tablet (100 mg total) by mouth once as needed for up to 1 dose for migraine. May repeat in 2 hours if headache persists or recurs. 30 tablet 1   traZODone (DESYREL) 100 MG tablet Take 1 tablet (100 mg total) by mouth at bedtime. 30 tablet 11   venlafaxine XR (EFFEXOR XR) 75 MG 24 hr capsule Take 2 capsules (150 mg total) by mouth daily with breakfast. 180 capsule 3   Vitamin D, Ergocalciferol, (DRISDOL) 1.25 MG (50000 UNIT) CAPS capsule Take 1 capsule (50,000 Units total) by mouth 2 (two) times a week. 24 capsule 1   No current facility-administered medications for this visit.    PAST MEDICAL HISTORY: Past Medical History:  Diagnosis Date   Anxiety    Depression    Headache    Heart murmur    childhood   History of twin pregnancy in prior pregnancy 09/09/2015   Last C/S.  Done due to IUGR of female twin; she died at 64 months old   Late prenatal care affecting pregnancy in second trimester, antepartum 09/09/2015   Melanoma (HCC) 1996   on head   Multiple sclerosis (HCC)    Multiple sclerosis (HCC)    Multiple sclerosis affecting pregnancy (HCC) 08/22/2015   Followed by Dr. Threasa Beards at [facility]  Stopped meds when found out she was pregnant. MFM consult/Dr. Otho Perl: "Although Ms. Nevers does have bladder incontinence which is a common symptoms of her MS, she  thinks this is more related to her pregnancy rather than MS as she is not having any of her other MS symptoms at this time.  I advised Ms. Hain to make an appointment to see her Neurologist Dr.    Previous cesarean delivery, antepartum condition or complication  08/22/2015   Prev C/S x 3   Prior pregnancy with fetal demise 08/22/2015   @ 22 weeks.   Status post repeat low transverse cesarean section 12/20/2015   Supervision of high-risk pregnancy 08/22/2015    Clinic  High Risk - Transfer from [facility] Prenatal Labs  Dating  LMP Blood type: O/Positive/-- (10/14 0000) O pos  Genetic Screen 1 Screen:  neg   AFP:     neg Antibody:Negative (10/14 0000)neg  Anatomic [place] Normal Rubella: Immune (10/14 0000)Immune  GTT  Third trimester: 129 RPR: NON REAC (02/23 0850) NR  Flu vaccine declines HBsAg: Negative (10/14 0000) Neg  TDaP vaccine 10/02/15       PAST SURGICAL HISTORY: Past Surgical History:  Procedure Laterality Date   ABDOMINAL HYSTERECTOMY  04/22/2022   Procedure: HYSTERECTOMY ABDOMINAL WITH BILATERAL SALPINGECTOMY;  Surgeon: Jaymes Graff, MD;  Location: [facility] OR;  Service: Gynecology;;   CESAREAN SECTION     x4   CESAREAN SECTION WITH BILATERAL TUBAL LIGATION N/A 12/20/2015   Procedure: CESAREAN SECTION WITH BILATERAL TUBAL LIGATION;  Surgeon: Willodean Rosenthal, MD;  Location: [facility];  Service: Obstetrics;  Laterality: N/A;   CYSTOSCOPY N/A 04/22/2022   Procedure: CYSTOSCOPY;  Surgeon: Jaymes Graff, MD;  Location: [facility] OR;  Service: Gynecology;  Laterality: N/A;   LAPAROSCOPIC VAGINAL HYSTERECTOMY WITH SALPINGECTOMY N/A 04/22/2022   Procedure: ATTEMPTED LAPAROSCOPIC ASSISTED VAGINAL HYSTERECTOMY;  Surgeon: Jaymes Graff, MD;  Location: [facility] OR;  Service: Gynecology;  Laterality: N/A;   MELANOMA EXCISION  age 78   on head   TUBAL LIGATION  2017    FAMILY HISTORY: Family History  Problem Relation Age of Onset   Diabetes Mother    Diabetes Maternal Uncle     Hypertension Maternal Grandmother     SOCIAL HISTORY: Social History   Socioeconomic History   Marital status: Divorced    Spouse name: Not on file   Number of children: Not on file   Years of education: Not on file   Highest education level: Not on file  Occupational History   Not on file  Tobacco Use   Smoking status: Some Days    Types: Cigars   Smokeless tobacco: Never   Tobacco comments:    Smokes cigars only - one every 3 weeks.   Vaping Use   Vaping status: Never Used  Substance and Sexual Activity   Alcohol use: Yes    Comment: occasionally   Drug use: No   Sexual activity: Not Currently    Birth control/protection: Surgical  Other Topics Concern   Not on file  Social History Narrative   Right handed   Caffeine-8oz daily occasionally   Social Determinants of Health   Financial Resource Strain: Not on file  Food Insecurity: Not on file  Transportation Needs: Not on file  Physical Activity: Not on file  Stress: Not on file  Social Connections: Not on file  Intimate Partner Violence: Not on file   Margie Ege, Edrick Oh, DNP  [facility] [address] [place], [place] [address] [phone]

## 2023-06-29 ENCOUNTER — Telehealth (INDEPENDENT_AMBULATORY_CARE_PROVIDER_SITE_OTHER): Payer: Medicaid Other | Admitting: Neurology

## 2023-06-29 DIAGNOSIS — F3289 Other specified depressive episodes: Secondary | ICD-10-CM | POA: Diagnosis not present

## 2023-06-29 DIAGNOSIS — G43709 Chronic migraine without aura, not intractable, without status migrainosus: Secondary | ICD-10-CM | POA: Diagnosis not present

## 2023-06-29 DIAGNOSIS — G35 Multiple sclerosis: Secondary | ICD-10-CM

## 2023-06-29 NOTE — Patient Instructions (Signed)
Get MRI brain and cervical spine  We are appealing Ocrevus Consider restart Effexor XR 75 mg daily for anxiety

## 2023-06-30 ENCOUNTER — Telehealth: Payer: Self-pay | Admitting: Neurology

## 2023-06-30 NOTE — Telephone Encounter (Signed)
Patient left a voice mail that she wants her MRI orders sent to [facility] fax # [phone]

## 2023-06-30 NOTE — Telephone Encounter (Signed)
[facility]: [id number] exp. 06/30/23-07/30/23 sent to  [facility]  [address] [place], [place] [address] [phone] Fax: [phone]

## 2023-08-16 ENCOUNTER — Telehealth: Payer: Self-pay | Admitting: Neurology

## 2023-08-16 ENCOUNTER — Encounter: Payer: Self-pay | Admitting: Neurology

## 2023-08-16 NOTE — Telephone Encounter (Signed)
 Received MRI report from [facility] for MRI brain and cervical spine completed 07/28/2023.  No comparison was done when resulting.  Can we get a disc of both MRIs for Dr. Onita to review for comparison to prior MRI?  Impression  1. There are numerous supratentorial demyelinating plaques. 2. There are no infratentorial demyelinating plaques in the posterior fossa. 3. There are no enhancing plaques at the present time. 4. There are multiple T1 hypointensities. 5. There is no visible cerebral atrophy. 6. There is no PML. 7. Mild/moderate paranasal sinus mucosal thickening.    Impression  1. Multiple chronic nonenhancing demyelinating plaques in the cervical and upper thoracic spinal cord. 2. No cord contour deformity, canal stenosis, or foraminal narrowing.

## 2023-10-12 ENCOUNTER — Telehealth: Payer: Self-pay | Admitting: Neurology

## 2023-10-12 NOTE — Telephone Encounter (Signed)
 [name], can we again check about getting an MRI disc from [facility] to view MRI brain, cervical spine imaging? Compare to prior imaging. Thanks!!

## 2023-10-27 ENCOUNTER — Telehealth: Payer: Self-pay | Admitting: Neurology

## 2023-10-27 NOTE — Telephone Encounter (Signed)
 Call to patient, no answer left message to return call to review MRI results

## 2023-10-27 NOTE — Telephone Encounter (Signed)
 Please call the patient, received an MRI disc, Dr. Terrace Arabia and I reviewed imaging to compare with prior MRI.  Overall appears stable.  Recommend continue Ocrevus, keep next follow-up appointment.  Impression  1. Multiple chronic nonenhancing demyelinating plaques in the cervical and upper thoracic spinal cord. 2. No cord contour deformity, canal stenosis, or foraminal narrowing.   Impression  1. There are numerous supratentorial demyelinating plaques. 2. There are no infratentorial demyelinating plaques in the posterior fossa. 3. There are no enhancing plaques at the present time. 4. There are multiple T1 hypointensities. 5. There is no visible cerebral atrophy. 6. There is no PML. 7. Mild/moderate paranasal sinus mucosal thickening.

## 2023-10-31 NOTE — Telephone Encounter (Signed)
 LVM BY HF 10/31/23 2ND ATTEMPT

## 2023-11-01 NOTE — Telephone Encounter (Signed)
 Pt left a vm on yesterday returning the call to CMA, please call pt back

## 2023-11-01 NOTE — Telephone Encounter (Signed)
 Called and relayed the following information:   Glean Salvo, NP  Nurse Practitioner Specialty: Neurology   Telephone Encounter Signed   Creation Time: 10/27/2023  1:48 PM   Signed     Please call the patient, received an MRI disc, Dr. Terrace Arabia and I reviewed imaging to compare with prior MRI.  Overall appears stable.  Recommend continue Ocrevus, keep next follow-up appointment.   Impression   1. Multiple chronic nonenhancing demyelinating plaques in the cervical and upper thoracic spinal cord. 2. No cord contour deformity, canal stenosis, or foraminal narrowing.     Impression   1. There are numerous supratentorial demyelinating plaques. 2. There are no infratentorial demyelinating plaques in the posterior fossa. 3. There are no enhancing plaques at the present time. 4. There are multiple T1 hypointensities. 5. There is no visible cerebral atrophy. 6. There is no PML. 7. Mild/moderate paranasal sinus mucosal thickening.          Pt voiced gratitude and understanding of all discussed

## 2023-12-21 ENCOUNTER — Telehealth: Payer: Self-pay | Admitting: Neurology

## 2023-12-21 NOTE — Telephone Encounter (Signed)
 LVM and sent mychart msg informing pt of need to reschedule 01/31/24 appt - NP out

## 2024-01-19 ENCOUNTER — Telehealth: Payer: Self-pay

## 2024-01-19 NOTE — Telephone Encounter (Signed)
 Call to patient, no answer. Left message to call office in regards infusions and appointment.

## 2024-01-31 ENCOUNTER — Ambulatory Visit: Payer: Medicaid Other | Admitting: Neurology

## 2024-04-23 ENCOUNTER — Encounter: Payer: Self-pay | Admitting: Neurology

## 2024-04-23 DIAGNOSIS — G35 Multiple sclerosis: Secondary | ICD-10-CM

## 2024-04-24 ENCOUNTER — Telehealth: Payer: Self-pay

## 2024-04-24 NOTE — Addendum Note (Signed)
 Addended by: GAYLAND LAURAINE PARAS on: 04/24/2024 10:06 AM   Modules accepted: Orders

## 2024-04-24 NOTE — Telephone Encounter (Signed)
 Neurology referral faxed to [facility] ([facility]) [phone]
